# Patient Record
Sex: Female | Born: 1945 | Hispanic: No | Smoking: Never smoker
Health system: Southern US, Community
[De-identification: ages and names within clinical notes are randomized; demographics above are authoritative.]

## PROBLEM LIST (undated history)

## (undated) DIAGNOSIS — I10 Essential (primary) hypertension: Secondary | ICD-10-CM

## (undated) DIAGNOSIS — E119 Type 2 diabetes mellitus without complications: Secondary | ICD-10-CM

## (undated) DIAGNOSIS — Z87442 Personal history of urinary calculi: Secondary | ICD-10-CM

## (undated) DIAGNOSIS — C801 Malignant (primary) neoplasm, unspecified: Secondary | ICD-10-CM

## (undated) DIAGNOSIS — L409 Psoriasis, unspecified: Secondary | ICD-10-CM

## (undated) HISTORY — PX: HYSTEROSCOPY: SHX211

## (undated) HISTORY — PX: BACK SURGERY: SHX140

## (undated) HISTORY — PX: ABDOMINAL HYSTERECTOMY: SHX81

## (undated) HISTORY — PX: CERVICAL SPINE SURGERY: SHX589

---

## 1999-08-22 ENCOUNTER — Ambulatory Visit (HOSPITAL_COMMUNITY): Admission: RE | Admit: 1999-08-22 | Discharge: 1999-08-22 | Payer: Self-pay | Admitting: Gynecology

## 2000-07-26 ENCOUNTER — Other Ambulatory Visit: Admission: RE | Admit: 2000-07-26 | Discharge: 2000-07-26 | Payer: Self-pay | Admitting: Gynecology

## 2001-07-26 ENCOUNTER — Other Ambulatory Visit: Admission: RE | Admit: 2001-07-26 | Discharge: 2001-07-26 | Payer: Self-pay | Admitting: Gynecology

## 2002-08-01 ENCOUNTER — Other Ambulatory Visit: Admission: RE | Admit: 2002-08-01 | Discharge: 2002-08-01 | Payer: Self-pay | Admitting: Gynecology

## 2003-05-10 ENCOUNTER — Encounter: Payer: Self-pay | Admitting: Neurosurgery

## 2003-05-10 ENCOUNTER — Inpatient Hospital Stay (HOSPITAL_COMMUNITY): Admission: RE | Admit: 2003-05-10 | Discharge: 2003-05-11 | Payer: Self-pay | Admitting: Neurosurgery

## 2003-08-07 ENCOUNTER — Other Ambulatory Visit: Admission: RE | Admit: 2003-08-07 | Discharge: 2003-08-07 | Payer: Self-pay | Admitting: Gynecology

## 2004-08-11 ENCOUNTER — Other Ambulatory Visit: Admission: RE | Admit: 2004-08-11 | Discharge: 2004-08-11 | Payer: Self-pay | Admitting: Gynecology

## 2005-02-22 ENCOUNTER — Emergency Department (HOSPITAL_COMMUNITY): Admission: EM | Admit: 2005-02-22 | Discharge: 2005-02-22 | Payer: Self-pay | Admitting: Emergency Medicine

## 2005-08-11 ENCOUNTER — Other Ambulatory Visit: Admission: RE | Admit: 2005-08-11 | Discharge: 2005-08-11 | Payer: Self-pay | Admitting: Gynecology

## 2006-08-26 ENCOUNTER — Other Ambulatory Visit: Admission: RE | Admit: 2006-08-26 | Discharge: 2006-08-26 | Payer: Self-pay | Admitting: Gynecology

## 2007-09-07 ENCOUNTER — Other Ambulatory Visit: Admission: RE | Admit: 2007-09-07 | Discharge: 2007-09-07 | Payer: Self-pay | Admitting: Gynecology

## 2008-10-29 ENCOUNTER — Encounter: Admission: RE | Admit: 2008-10-29 | Discharge: 2008-10-29 | Payer: Self-pay | Admitting: Family Medicine

## 2015-08-15 ENCOUNTER — Other Ambulatory Visit: Payer: Self-pay | Admitting: Family Medicine

## 2015-08-15 DIAGNOSIS — R0989 Other specified symptoms and signs involving the circulatory and respiratory systems: Secondary | ICD-10-CM

## 2015-08-29 ENCOUNTER — Ambulatory Visit
Admission: RE | Admit: 2015-08-29 | Discharge: 2015-08-29 | Disposition: A | Discharge status: Home / Self Care | Payer: Medicare Other | Source: Ambulatory Visit | Attending: Family Medicine | Admitting: Family Medicine

## 2015-08-29 DIAGNOSIS — R0989 Other specified symptoms and signs involving the circulatory and respiratory systems: Secondary | ICD-10-CM

## 2016-07-27 ENCOUNTER — Encounter (HOSPITAL_BASED_OUTPATIENT_CLINIC_OR_DEPARTMENT_OTHER): Payer: Self-pay | Admitting: *Deleted

## 2016-07-27 ENCOUNTER — Emergency Department (HOSPITAL_BASED_OUTPATIENT_CLINIC_OR_DEPARTMENT_OTHER)
Admission: EM | Admit: 2016-07-27 | Discharge: 2016-07-27 | Disposition: A | Discharge status: Home / Self Care | Payer: Medicare Other | Attending: Emergency Medicine | Admitting: Emergency Medicine

## 2016-07-27 DIAGNOSIS — R350 Frequency of micturition: Secondary | ICD-10-CM | POA: Diagnosis present

## 2016-07-27 DIAGNOSIS — I10 Essential (primary) hypertension: Secondary | ICD-10-CM | POA: Insufficient documentation

## 2016-07-27 DIAGNOSIS — Z79899 Other long term (current) drug therapy: Secondary | ICD-10-CM | POA: Insufficient documentation

## 2016-07-27 DIAGNOSIS — N3001 Acute cystitis with hematuria: Secondary | ICD-10-CM | POA: Diagnosis not present

## 2016-07-27 HISTORY — DX: Psoriasis, unspecified: L40.9

## 2016-07-27 HISTORY — DX: Essential (primary) hypertension: I10

## 2016-07-27 LAB — URINALYSIS, ROUTINE W REFLEX MICROSCOPIC
Glucose, UA: NEGATIVE mg/dL
Ketones, ur: 15 mg/dL — AB
NITRITE: POSITIVE — AB
Protein, ur: 30 mg/dL — AB
SPECIFIC GRAVITY, URINE: 1.021 (ref 1.005–1.030)
pH: 5 (ref 5.0–8.0)

## 2016-07-27 LAB — URINE MICROSCOPIC-ADD ON

## 2016-07-27 MED ORDER — LEVOFLOXACIN 500 MG PO TABS: 500.0000 mg | ORAL_TABLET | Freq: Every day | ORAL | 0 refills | Status: DC

## 2016-07-27 MED ORDER — LEVOFLOXACIN 500 MG PO TABS
500.0000 mg | ORAL_TABLET | Freq: Once | ORAL | Status: AC
  Administered 2016-07-27: 500 mg via ORAL
  Filled 2016-07-27: qty 1

## 2016-07-27 NOTE — ED Provider Notes (Signed)
[facility] DEPT [facility] Provider Note   CSN: [id number] Arrival date & time: 07/27/16  1824     History   Chief Complaint Chief Complaint  Patient presents with  . Urinary Frequency    HPI Shelby Fleming is a 70 y.o. female.  HPI Patient has had some urgency with urination for approximately a week. She is also been experiencing general malaise and fatigue. Intermittent left lower quadrant and suprapubic discomfort. No documented fever. No flank pain. Patient reports that she has been treated twice for sinusitis. She first took a course of amoxicillin which she finished approximately 2 weeks ago. She reports she still had residual sinus congestion and pressure and was treated with a course of Zithromax. Symptoms of cough and sinus congestion have now improved. She reports they're not 100% resolved but much better than they were previously. The urinary urgency was a new symptom developing approximately a week ago. She tried some over-the-counter Azo but has not gotten improvement. Past Medical History:  Diagnosis Date  . Hypertension   . Psoriasis     There are no active problems to display for this patient.   Past Surgical History:  Procedure Laterality Date  . ABDOMINAL HYSTERECTOMY    . BACK SURGERY      OB History    No data available       Home Medications    Prior to Admission medications   Medication Sig Start Date End Date Taking? Authorizing Provider  Apremilast (OTEZLA PO) Take by mouth.   Yes Historical Provider, MD  lisinopril (PRINIVIL,ZESTRIL) 40 MG tablet Take 40 mg by mouth daily.   Yes Historical Provider, MD  omega-3 acid ethyl esters (LOVAZA) 1 g capsule Take by mouth 2 (two) times daily.   Yes Historical Provider, MD    Family History History reviewed. No pertinent family history.  Social History Social History  Substance Use Topics  . Smoking status: Never Smoker  . Smokeless tobacco: Never Used  . Alcohol use No     Allergies    Codeine   Review of Systems Review of Systems 10 Systems reviewed and are negative for acute change except as noted in the HPI.   Physical Exam Updated Vital Signs BP 181/78   Pulse 78   Temp 98.1 F (36.7 C)   Resp 18   Ht 5\' 4"  (1.626 m)   Wt 189 lb (85.7 kg)   SpO2 98%   BMI 32.44 kg/m   Physical Exam  Constitutional: She appears well-developed and well-nourished. No distress.  HENT:  Head: Normocephalic and atraumatic.  Mouth/Throat: Oropharynx is clear and moist.  Bilateral TMs normal. Nares patent without drainage or bogginess.  Eyes: Conjunctivae and EOM are normal.  Neck: Neck supple.  Cardiovascular: Normal rate and regular rhythm.   No murmur heard. Pulmonary/Chest: Effort normal and breath sounds normal. No respiratory distress.  Abdominal: Soft. There is no tenderness.  Very mildly reproducible suprapubic discomfort to deep palpation. No CVA tenderness to percussion.  Musculoskeletal: Normal range of motion. She exhibits no edema or tenderness.  Excellent condition of lower extremities.  Neurological: She is alert. She exhibits normal muscle tone. Coordination normal.  Skin: Skin is warm and dry.  Psychiatric: She has a normal mood and affect.  Nursing note and vitals reviewed.    ED Treatments / Results  Labs (all labs ordered are listed, but only abnormal results are displayed) Labs Reviewed  URINALYSIS, ROUTINE W REFLEX MICROSCOPIC (NOT AT [facility]) - Abnormal; Notable for the  following:       Result Value   Color, Urine ORANGE (*)    APPearance CLOUDY (*)    Hgb urine dipstick MODERATE (*)    Bilirubin Urine SMALL (*)    Ketones, ur 15 (*)    Protein, ur 30 (*)    Nitrite POSITIVE (*)    Leukocytes, UA MODERATE (*)    All other components within normal limits  URINE MICROSCOPIC-ADD ON - Abnormal; Notable for the following:    Squamous Epithelial / LPF 0-5 (*)    Bacteria, UA MANY (*)    All other components within normal limits  URINE  CULTURE    EKG  EKG Interpretation None       Radiology No results found.  Procedures Procedures (including critical care time)  Medications Ordered in ED Medications  levofloxacin (LEVAQUIN) tablet 500 mg (not administered)     Initial Impression / Assessment and Plan / ED Course  I have reviewed the triage vital signs and the nursing notes.  Pertinent labs & imaging results that were available during my care of the patient were reviewed by me and considered in my medical decision making (see chart for details).  Clinical Course     Final Clinical Impressions(s) / ED Diagnoses   Final diagnoses:  Acute cystitis with hematuria  Patient is alert and nontoxic. She has not had fever. No CVA tenderness. Patient has. General malaise and fatigue in conjunction with urinary urgency. Clinically the patient is alert and appropriate and nontoxic. I will initiate outpatient antibiotics. Patient has follow-up with her PCP tomorrow. She is counseled on signs and symptoms were to return.  New Prescriptions New Prescriptions   No medications on file     [name], MD 07/27/16 2022

## 2016-07-27 NOTE — ED Triage Notes (Signed)
Pt c/o urinary urgency and pressure x 2 days

## 2016-07-27 NOTE — ED Notes (Signed)
ED Provider at bedside. 

## 2016-07-30 LAB — URINE CULTURE

## 2016-07-31 ENCOUNTER — Telehealth (HOSPITAL_BASED_OUTPATIENT_CLINIC_OR_DEPARTMENT_OTHER): Payer: Self-pay

## 2016-07-31 NOTE — Telephone Encounter (Signed)
Post ED Visit - Positive Culture Follow-up  Culture report reviewed by antimicrobial stewardship pharmacist:  []  Elenor Quinones, Pharm.D. []  Heide Guile, Pharm.D., BCPS []  Parks Neptune, Pharm.D. []  Alycia Rossetti, Pharm.D., BCPS []  [place], Pharm.D., BCPS, AAHIVP []  Legrand Como, Pharm.D., BCPS, AAHIVP []  Milus Glazier, Pharm.D. []  Stephens November, [place].D. Dimitri Ped Pharm D Positive urine culture Treated with levofloxacin, organism sensitive to the same and no further patient follow-up is required at this time.  Genia Del 07/31/2016, 12:06 PM

## 2019-10-15 ENCOUNTER — Ambulatory Visit: Payer: Medicare Other | Attending: Internal Medicine

## 2019-10-15 DIAGNOSIS — Z23 Encounter for immunization: Secondary | ICD-10-CM | POA: Insufficient documentation

## 2019-10-15 NOTE — Progress Notes (Signed)
   Covid-19 Vaccination Clinic  Name:  Shelby Fleming    MRN: [id number] DOB: 1946/07/27  10/15/2019  [name] was observed post Covid-19 immunization for 15 minutes without incidence. She was provided with Vaccine Information Sheet and instruction to access the V-Safe system.   [name] was instructed to call 911 with any severe reactions post vaccine: Marland Kitchen Difficulty breathing  . Swelling of your face and throat  . A fast heartbeat  . A bad rash all over your body  . Dizziness and weakness    Immunizations Administered    Name Date Dose VIS Date Route   Pfizer COVID-19 Vaccine 10/15/2019  4:48 PM 0.3 mL 08/18/2019 Intramuscular   Manufacturer: [place]   Lot: [id number]   [place]: [id number]

## 2019-11-09 ENCOUNTER — Ambulatory Visit: Payer: Medicare Other | Attending: Internal Medicine

## 2019-11-09 DIAGNOSIS — Z23 Encounter for immunization: Secondary | ICD-10-CM | POA: Insufficient documentation

## 2019-11-09 NOTE — Progress Notes (Signed)
   Covid-19 Vaccination Clinic  Name:  Shelby Fleming    MRN: [id number] DOB: Aug 03, 1946  11/09/2019  [name] was observed post Covid-19 immunization for 15 minutes without incident. She was provided with Vaccine Information Sheet and instruction to access the V-Safe system.   [name] was instructed to call 911 with any severe reactions post vaccine: Marland Kitchen Difficulty breathing  . Swelling of face and throat  . A fast heartbeat  . A bad rash all over body  . Dizziness and weakness   Immunizations Administered    Name Date Dose VIS Date Route   Pfizer COVID-19 Vaccine 11/09/2019  6:17 PM 0.3 mL 08/18/2019 Intramuscular   Manufacturer: [place]   Lot: [id number]   [place]: [id number]

## 2020-07-06 ENCOUNTER — Ambulatory Visit: Payer: Self-pay | Attending: Internal Medicine

## 2020-07-06 DIAGNOSIS — Z23 Encounter for immunization: Secondary | ICD-10-CM

## 2020-07-06 NOTE — Progress Notes (Signed)
   Covid-19 Vaccination Clinic  Name:  Shelby Fleming    MRN: [id number] DOB: Jul 04, 1946  07/06/2020  Shelby Fleming was observed post Covid-19 immunization for 15 minutes without incident. She was provided with Vaccine Information Sheet and instruction to access the V-Safe system.   Shelby Fleming was instructed to call 911 with any severe reactions post vaccine: Marland Kitchen Difficulty breathing  . Swelling of face and throat  . A fast heartbeat  . A bad rash all over body  . Dizziness and weakness

## 2020-09-11 DIAGNOSIS — M2550 Pain in unspecified joint: Secondary | ICD-10-CM | POA: Diagnosis not present

## 2020-09-11 DIAGNOSIS — L409 Psoriasis, unspecified: Secondary | ICD-10-CM | POA: Diagnosis not present

## 2020-09-11 DIAGNOSIS — M542 Cervicalgia: Secondary | ICD-10-CM | POA: Diagnosis not present

## 2020-09-11 DIAGNOSIS — M150 Primary generalized (osteo)arthritis: Secondary | ICD-10-CM | POA: Diagnosis not present

## 2020-09-11 DIAGNOSIS — L4059 Other psoriatic arthropathy: Secondary | ICD-10-CM | POA: Diagnosis not present

## 2020-10-03 DIAGNOSIS — H2000 Unspecified acute and subacute iridocyclitis: Secondary | ICD-10-CM | POA: Diagnosis not present

## 2020-10-03 DIAGNOSIS — N390 Urinary tract infection, site not specified: Secondary | ICD-10-CM | POA: Diagnosis not present

## 2020-10-18 DIAGNOSIS — I10 Essential (primary) hypertension: Secondary | ICD-10-CM | POA: Diagnosis not present

## 2020-10-18 DIAGNOSIS — E1169 Type 2 diabetes mellitus with other specified complication: Secondary | ICD-10-CM | POA: Diagnosis not present

## 2020-10-18 DIAGNOSIS — E785 Hyperlipidemia, unspecified: Secondary | ICD-10-CM | POA: Diagnosis not present

## 2020-10-18 DIAGNOSIS — L4050 Arthropathic psoriasis, unspecified: Secondary | ICD-10-CM | POA: Diagnosis not present

## 2020-10-23 DIAGNOSIS — M5030 Other cervical disc degeneration, unspecified cervical region: Secondary | ICD-10-CM | POA: Diagnosis not present

## 2020-10-23 DIAGNOSIS — L4059 Other psoriatic arthropathy: Secondary | ICD-10-CM | POA: Diagnosis not present

## 2020-10-23 DIAGNOSIS — M150 Primary generalized (osteo)arthritis: Secondary | ICD-10-CM | POA: Diagnosis not present

## 2020-10-23 DIAGNOSIS — L409 Psoriasis, unspecified: Secondary | ICD-10-CM | POA: Diagnosis not present

## 2020-10-23 DIAGNOSIS — M2550 Pain in unspecified joint: Secondary | ICD-10-CM | POA: Diagnosis not present

## 2021-04-22 DIAGNOSIS — M5030 Other cervical disc degeneration, unspecified cervical region: Secondary | ICD-10-CM | POA: Diagnosis not present

## 2021-04-22 DIAGNOSIS — L4059 Other psoriatic arthropathy: Secondary | ICD-10-CM | POA: Diagnosis not present

## 2021-04-22 DIAGNOSIS — L409 Psoriasis, unspecified: Secondary | ICD-10-CM | POA: Diagnosis not present

## 2021-04-22 DIAGNOSIS — M2550 Pain in unspecified joint: Secondary | ICD-10-CM | POA: Diagnosis not present

## 2021-04-22 DIAGNOSIS — M150 Primary generalized (osteo)arthritis: Secondary | ICD-10-CM | POA: Diagnosis not present

## 2021-04-25 DIAGNOSIS — E785 Hyperlipidemia, unspecified: Secondary | ICD-10-CM | POA: Diagnosis not present

## 2021-04-25 DIAGNOSIS — H524 Presbyopia: Secondary | ICD-10-CM | POA: Diagnosis not present

## 2021-04-25 DIAGNOSIS — Z0000 Encounter for general adult medical examination without abnormal findings: Secondary | ICD-10-CM | POA: Diagnosis not present

## 2021-04-25 DIAGNOSIS — E1169 Type 2 diabetes mellitus with other specified complication: Secondary | ICD-10-CM | POA: Diagnosis not present

## 2021-04-25 DIAGNOSIS — H5211 Myopia, right eye: Secondary | ICD-10-CM | POA: Diagnosis not present

## 2021-04-25 DIAGNOSIS — Z961 Presence of intraocular lens: Secondary | ICD-10-CM | POA: Diagnosis not present

## 2021-04-28 DIAGNOSIS — E785 Hyperlipidemia, unspecified: Secondary | ICD-10-CM | POA: Diagnosis not present

## 2021-04-28 DIAGNOSIS — Z0000 Encounter for general adult medical examination without abnormal findings: Secondary | ICD-10-CM | POA: Diagnosis not present

## 2021-04-28 DIAGNOSIS — I10 Essential (primary) hypertension: Secondary | ICD-10-CM | POA: Diagnosis not present

## 2021-04-28 DIAGNOSIS — L4050 Arthropathic psoriasis, unspecified: Secondary | ICD-10-CM | POA: Diagnosis not present

## 2021-04-28 DIAGNOSIS — R079 Chest pain, unspecified: Secondary | ICD-10-CM | POA: Diagnosis not present

## 2021-04-28 DIAGNOSIS — E1169 Type 2 diabetes mellitus with other specified complication: Secondary | ICD-10-CM | POA: Diagnosis not present

## 2021-06-17 DIAGNOSIS — Z1231 Encounter for screening mammogram for malignant neoplasm of breast: Secondary | ICD-10-CM | POA: Diagnosis not present

## 2021-09-25 DIAGNOSIS — D0471 Carcinoma in situ of skin of right lower limb, including hip: Secondary | ICD-10-CM | POA: Diagnosis not present

## 2021-09-25 DIAGNOSIS — L814 Other melanin hyperpigmentation: Secondary | ICD-10-CM | POA: Diagnosis not present

## 2021-09-25 DIAGNOSIS — L281 Prurigo nodularis: Secondary | ICD-10-CM | POA: Diagnosis not present

## 2021-09-25 DIAGNOSIS — C44729 Squamous cell carcinoma of skin of left lower limb, including hip: Secondary | ICD-10-CM | POA: Diagnosis not present

## 2021-09-25 DIAGNOSIS — L821 Other seborrheic keratosis: Secondary | ICD-10-CM | POA: Diagnosis not present

## 2021-09-25 DIAGNOSIS — D2261 Melanocytic nevi of right upper limb, including shoulder: Secondary | ICD-10-CM | POA: Diagnosis not present

## 2021-09-25 DIAGNOSIS — D225 Melanocytic nevi of trunk: Secondary | ICD-10-CM | POA: Diagnosis not present

## 2021-09-25 DIAGNOSIS — Z85828 Personal history of other malignant neoplasm of skin: Secondary | ICD-10-CM | POA: Diagnosis not present

## 2021-09-25 DIAGNOSIS — L400 Psoriasis vulgaris: Secondary | ICD-10-CM | POA: Diagnosis not present

## 2021-09-25 DIAGNOSIS — D485 Neoplasm of uncertain behavior of skin: Secondary | ICD-10-CM | POA: Diagnosis not present

## 2021-10-09 DIAGNOSIS — D0471 Carcinoma in situ of skin of right lower limb, including hip: Secondary | ICD-10-CM | POA: Diagnosis not present

## 2021-10-09 DIAGNOSIS — L281 Prurigo nodularis: Secondary | ICD-10-CM | POA: Diagnosis not present

## 2021-10-29 DIAGNOSIS — L281 Prurigo nodularis: Secondary | ICD-10-CM | POA: Diagnosis not present

## 2021-10-29 DIAGNOSIS — Z85828 Personal history of other malignant neoplasm of skin: Secondary | ICD-10-CM | POA: Diagnosis not present

## 2021-10-29 DIAGNOSIS — L400 Psoriasis vulgaris: Secondary | ICD-10-CM | POA: Diagnosis not present

## 2021-10-29 DIAGNOSIS — M150 Primary generalized (osteo)arthritis: Secondary | ICD-10-CM | POA: Diagnosis not present

## 2021-10-29 DIAGNOSIS — L409 Psoriasis, unspecified: Secondary | ICD-10-CM | POA: Diagnosis not present

## 2021-10-29 DIAGNOSIS — L4059 Other psoriatic arthropathy: Secondary | ICD-10-CM | POA: Diagnosis not present

## 2021-10-29 DIAGNOSIS — L570 Actinic keratosis: Secondary | ICD-10-CM | POA: Diagnosis not present

## 2021-10-29 DIAGNOSIS — M5030 Other cervical disc degeneration, unspecified cervical region: Secondary | ICD-10-CM | POA: Diagnosis not present

## 2021-10-30 DIAGNOSIS — E1169 Type 2 diabetes mellitus with other specified complication: Secondary | ICD-10-CM | POA: Diagnosis not present

## 2021-10-30 DIAGNOSIS — E785 Hyperlipidemia, unspecified: Secondary | ICD-10-CM | POA: Diagnosis not present

## 2021-10-30 DIAGNOSIS — I10 Essential (primary) hypertension: Secondary | ICD-10-CM | POA: Diagnosis not present

## 2021-10-30 DIAGNOSIS — L4050 Arthropathic psoriasis, unspecified: Secondary | ICD-10-CM | POA: Diagnosis not present

## 2021-12-10 DIAGNOSIS — D0471 Carcinoma in situ of skin of right lower limb, including hip: Secondary | ICD-10-CM | POA: Diagnosis not present

## 2021-12-10 DIAGNOSIS — D0472 Carcinoma in situ of skin of left lower limb, including hip: Secondary | ICD-10-CM | POA: Diagnosis not present

## 2021-12-10 DIAGNOSIS — C44729 Squamous cell carcinoma of skin of left lower limb, including hip: Secondary | ICD-10-CM | POA: Diagnosis not present

## 2022-01-31 ENCOUNTER — Emergency Department (HOSPITAL_BASED_OUTPATIENT_CLINIC_OR_DEPARTMENT_OTHER): Payer: Medicare Other

## 2022-01-31 ENCOUNTER — Inpatient Hospital Stay (HOSPITAL_BASED_OUTPATIENT_CLINIC_OR_DEPARTMENT_OTHER)
Admission: EM | Admit: 2022-01-31 | Discharge: 2022-02-11 | DRG: 030 | Disposition: A | Discharge status: Rehabilitation Facility | Payer: Medicare Other | Attending: Neurosurgery | Admitting: Neurosurgery

## 2022-01-31 ENCOUNTER — Encounter (HOSPITAL_BASED_OUTPATIENT_CLINIC_OR_DEPARTMENT_OTHER): Payer: Self-pay

## 2022-01-31 ENCOUNTER — Emergency Department (HOSPITAL_BASED_OUTPATIENT_CLINIC_OR_DEPARTMENT_OTHER): Payer: Medicare Other | Admitting: Radiology

## 2022-01-31 ENCOUNTER — Other Ambulatory Visit: Payer: Self-pay

## 2022-01-31 DIAGNOSIS — M79601 Pain in right arm: Secondary | ICD-10-CM | POA: Diagnosis not present

## 2022-01-31 DIAGNOSIS — W01198A Fall on same level from slipping, tripping and stumbling with subsequent striking against other object, initial encounter: Secondary | ICD-10-CM | POA: Diagnosis present

## 2022-01-31 DIAGNOSIS — E785 Hyperlipidemia, unspecified: Secondary | ICD-10-CM | POA: Diagnosis not present

## 2022-01-31 DIAGNOSIS — Z885 Allergy status to narcotic agent status: Secondary | ICD-10-CM | POA: Diagnosis not present

## 2022-01-31 DIAGNOSIS — S12200A Unspecified displaced fracture of third cervical vertebra, initial encounter for closed fracture: Secondary | ICD-10-CM | POA: Diagnosis not present

## 2022-01-31 DIAGNOSIS — M1990 Unspecified osteoarthritis, unspecified site: Secondary | ICD-10-CM | POA: Diagnosis not present

## 2022-01-31 DIAGNOSIS — R339 Retention of urine, unspecified: Secondary | ICD-10-CM | POA: Diagnosis not present

## 2022-01-31 DIAGNOSIS — Y92009 Unspecified place in unspecified non-institutional (private) residence as the place of occurrence of the external cause: Secondary | ICD-10-CM | POA: Diagnosis not present

## 2022-01-31 DIAGNOSIS — E876 Hypokalemia: Secondary | ICD-10-CM | POA: Diagnosis not present

## 2022-01-31 DIAGNOSIS — M25562 Pain in left knee: Secondary | ICD-10-CM | POA: Diagnosis not present

## 2022-01-31 DIAGNOSIS — M4802 Spinal stenosis, cervical region: Secondary | ICD-10-CM | POA: Diagnosis not present

## 2022-01-31 DIAGNOSIS — D649 Anemia, unspecified: Secondary | ICD-10-CM | POA: Diagnosis not present

## 2022-01-31 DIAGNOSIS — S12300A Unspecified displaced fracture of fourth cervical vertebra, initial encounter for closed fracture: Secondary | ICD-10-CM | POA: Diagnosis present

## 2022-01-31 DIAGNOSIS — N319 Neuromuscular dysfunction of bladder, unspecified: Secondary | ICD-10-CM | POA: Diagnosis not present

## 2022-01-31 DIAGNOSIS — S12301A Unspecified nondisplaced fracture of fourth cervical vertebra, initial encounter for closed fracture: Secondary | ICD-10-CM | POA: Diagnosis not present

## 2022-01-31 DIAGNOSIS — L409 Psoriasis, unspecified: Secondary | ICD-10-CM | POA: Diagnosis present

## 2022-01-31 DIAGNOSIS — I10 Essential (primary) hypertension: Secondary | ICD-10-CM | POA: Diagnosis not present

## 2022-01-31 DIAGNOSIS — W19XXXA Unspecified fall, initial encounter: Secondary | ICD-10-CM | POA: Diagnosis not present

## 2022-01-31 DIAGNOSIS — S14129A Central cord syndrome at unspecified level of cervical spinal cord, initial encounter: Secondary | ICD-10-CM | POA: Diagnosis not present

## 2022-01-31 DIAGNOSIS — M50221 Other cervical disc displacement at C4-C5 level: Secondary | ICD-10-CM | POA: Diagnosis present

## 2022-01-31 DIAGNOSIS — Z043 Encounter for examination and observation following other accident: Secondary | ICD-10-CM | POA: Diagnosis not present

## 2022-01-31 DIAGNOSIS — G8911 Acute pain due to trauma: Secondary | ICD-10-CM | POA: Diagnosis not present

## 2022-01-31 DIAGNOSIS — M79642 Pain in left hand: Secondary | ICD-10-CM | POA: Diagnosis not present

## 2022-01-31 DIAGNOSIS — Z79899 Other long term (current) drug therapy: Secondary | ICD-10-CM | POA: Diagnosis not present

## 2022-01-31 DIAGNOSIS — S12290A Other displaced fracture of third cervical vertebra, initial encounter for closed fracture: Secondary | ICD-10-CM | POA: Diagnosis not present

## 2022-01-31 DIAGNOSIS — Z981 Arthrodesis status: Secondary | ICD-10-CM

## 2022-01-31 DIAGNOSIS — S0990XA Unspecified injury of head, initial encounter: Secondary | ICD-10-CM | POA: Diagnosis not present

## 2022-01-31 DIAGNOSIS — S12391A Other nondisplaced fracture of fourth cervical vertebra, initial encounter for closed fracture: Secondary | ICD-10-CM | POA: Diagnosis not present

## 2022-01-31 DIAGNOSIS — R1310 Dysphagia, unspecified: Secondary | ICD-10-CM | POA: Diagnosis not present

## 2022-01-31 DIAGNOSIS — S12100A Unspecified displaced fracture of second cervical vertebra, initial encounter for closed fracture: Secondary | ICD-10-CM | POA: Diagnosis not present

## 2022-01-31 DIAGNOSIS — R600 Localized edema: Secondary | ICD-10-CM | POA: Diagnosis not present

## 2022-01-31 DIAGNOSIS — S80212A Abrasion, left knee, initial encounter: Secondary | ICD-10-CM | POA: Diagnosis present

## 2022-01-31 DIAGNOSIS — K5900 Constipation, unspecified: Secondary | ICD-10-CM | POA: Diagnosis not present

## 2022-01-31 DIAGNOSIS — S14124A Central cord syndrome at C4 level of cervical spinal cord, initial encounter: Principal | ICD-10-CM | POA: Diagnosis present

## 2022-01-31 DIAGNOSIS — S14129D Central cord syndrome at unspecified level of cervical spinal cord, subsequent encounter: Secondary | ICD-10-CM | POA: Diagnosis not present

## 2022-01-31 DIAGNOSIS — Z743 Need for continuous supervision: Secondary | ICD-10-CM | POA: Diagnosis not present

## 2022-01-31 DIAGNOSIS — Z4889 Encounter for other specified surgical aftercare: Secondary | ICD-10-CM | POA: Diagnosis not present

## 2022-01-31 DIAGNOSIS — M7989 Other specified soft tissue disorders: Secondary | ICD-10-CM | POA: Diagnosis not present

## 2022-01-31 DIAGNOSIS — K592 Neurogenic bowel, not elsewhere classified: Secondary | ICD-10-CM | POA: Diagnosis not present

## 2022-01-31 DIAGNOSIS — R208 Other disturbances of skin sensation: Secondary | ICD-10-CM | POA: Diagnosis present

## 2022-01-31 DIAGNOSIS — S129XXA Fracture of neck, unspecified, initial encounter: Secondary | ICD-10-CM | POA: Diagnosis present

## 2022-01-31 DIAGNOSIS — S14104A Unspecified injury at C4 level of cervical spinal cord, initial encounter: Secondary | ICD-10-CM | POA: Diagnosis not present

## 2022-01-31 DIAGNOSIS — M25552 Pain in left hip: Secondary | ICD-10-CM | POA: Diagnosis not present

## 2022-01-31 DIAGNOSIS — M19049 Primary osteoarthritis, unspecified hand: Secondary | ICD-10-CM | POA: Diagnosis not present

## 2022-01-31 DIAGNOSIS — Z8669 Personal history of other diseases of the nervous system and sense organs: Secondary | ICD-10-CM | POA: Diagnosis not present

## 2022-01-31 LAB — CBC WITH DIFFERENTIAL/PLATELET
Abs Immature Granulocytes: 0.05 10*3/uL (ref 0.00–0.07)
Basophils Absolute: 0 10*3/uL (ref 0.0–0.1)
Basophils Relative: 0 %
Eosinophils Absolute: 0.2 10*3/uL (ref 0.0–0.5)
Eosinophils Relative: 2 %
HCT: 33.4 % — ABNORMAL LOW (ref 36.0–46.0)
Hemoglobin: 10.5 g/dL — ABNORMAL LOW (ref 12.0–15.0)
Immature Granulocytes: 1 %
Lymphocytes Relative: 13 %
Lymphs Abs: 1.3 10*3/uL (ref 0.7–4.0)
MCH: 27.6 pg (ref 26.0–34.0)
MCHC: 31.4 g/dL (ref 30.0–36.0)
MCV: 87.9 fL (ref 80.0–100.0)
Monocytes Absolute: 0.6 10*3/uL (ref 0.1–1.0)
Monocytes Relative: 6 %
Neutro Abs: 7.8 10*3/uL — ABNORMAL HIGH (ref 1.7–7.7)
Neutrophils Relative %: 78 %
Platelets: 307 10*3/uL (ref 150–400)
RBC: 3.8 MIL/uL — ABNORMAL LOW (ref 3.87–5.11)
RDW: 13.7 % (ref 11.5–15.5)
WBC: 9.9 10*3/uL (ref 4.0–10.5)
nRBC: 0 % (ref 0.0–0.2)

## 2022-01-31 LAB — COMPREHENSIVE METABOLIC PANEL
ALT: 7 U/L (ref 0–44)
AST: 13 U/L — ABNORMAL LOW (ref 15–41)
Albumin: 4.1 g/dL (ref 3.5–5.0)
Alkaline Phosphatase: 50 U/L (ref 38–126)
Anion gap: 10 (ref 5–15)
BUN: 22 mg/dL (ref 8–23)
CO2: 25 mmol/L (ref 22–32)
Calcium: 9.3 mg/dL (ref 8.9–10.3)
Chloride: 103 mmol/L (ref 98–111)
Creatinine, Ser: 0.97 mg/dL (ref 0.44–1.00)
GFR, Estimated: >60 mL/min (ref >60)
Glucose, Bld: 172 mg/dL — ABNORMAL HIGH (ref 70–99)
Potassium: 3.8 mmol/L (ref 3.5–5.1)
Sodium: 138 mmol/L (ref 135–145)
Total Bilirubin: 0.5 mg/dL (ref 0.3–1.2)
Total Protein: 6.9 g/dL (ref 6.5–8.1)

## 2022-01-31 LAB — PROTIME-INR
INR: 1 (ref 0.8–1.2)
Prothrombin Time: 12.9 seconds (ref 11.4–15.2)

## 2022-01-31 LAB — APTT: aPTT: 26 seconds (ref 24–36)

## 2022-01-31 MED ORDER — POLYETHYLENE GLYCOL 3350 17 G PO PACK: 17.0000 g | PACK | Freq: Every day | ORAL | Status: DC | PRN

## 2022-01-31 MED ORDER — SODIUM CHLORIDE 0.9% FLUSH
3.0000 mL | Freq: Two times a day (BID) | INTRAVENOUS | Status: DC
  Administered 2022-01-31 – 2022-02-01 (×3): 3 mL via INTRAVENOUS

## 2022-01-31 MED ORDER — ONDANSETRON HCL 4 MG PO TABS: 4.0000 mg | ORAL_TABLET | Freq: Four times a day (QID) | ORAL | Status: DC | PRN

## 2022-01-31 MED ORDER — HYDROMORPHONE HCL 1 MG/ML IJ SOLN
1.0000 mg | INTRAMUSCULAR | Status: DC | PRN
  Administered 2022-01-31 – 2022-02-01 (×7): 1 mg via INTRAVENOUS
  Filled 2022-01-31 (×8): qty 1

## 2022-01-31 MED ORDER — SODIUM CHLORIDE 0.9 % IV SOLN
250.0000 mL | INTRAVENOUS | Status: DC
  Administered 2022-01-31: 250 mL via INTRAVENOUS

## 2022-01-31 MED ORDER — ONDANSETRON HCL 4 MG/2ML IJ SOLN
4.0000 mg | Freq: Four times a day (QID) | INTRAMUSCULAR | Status: DC | PRN
  Administered 2022-02-01: 4 mg via INTRAVENOUS
  Filled 2022-01-31: qty 2

## 2022-01-31 MED ORDER — OXYCODONE HCL 5 MG PO TABS: 5.0000 mg | ORAL_TABLET | ORAL | Status: DC | PRN

## 2022-01-31 MED ORDER — OXYCODONE HCL 5 MG PO TABS: 10.0000 mg | ORAL_TABLET | ORAL | Status: DC | PRN

## 2022-01-31 MED ORDER — FENTANYL CITRATE PF 50 MCG/ML IJ SOSY
50.0000 ug | PREFILLED_SYRINGE | Freq: Once | INTRAMUSCULAR | Status: AC
  Administered 2022-01-31: 50 ug via INTRAVENOUS
  Filled 2022-01-31: qty 1

## 2022-01-31 MED ORDER — LISINOPRIL 20 MG PO TABS
40.0000 mg | ORAL_TABLET | Freq: Every day | ORAL | Status: DC
  Filled 2022-01-31: qty 2

## 2022-01-31 MED ORDER — SODIUM CHLORIDE 0.9% FLUSH: 3.0000 mL | INTRAVENOUS | Status: DC | PRN

## 2022-01-31 MED ORDER — ACETAMINOPHEN 650 MG RE SUPP: 650.0000 mg | RECTAL | Status: DC | PRN

## 2022-01-31 MED ORDER — ACETAMINOPHEN 325 MG PO TABS
650.0000 mg | ORAL_TABLET | ORAL | Status: DC | PRN
  Administered 2022-02-01: 650 mg via ORAL
  Filled 2022-01-31: qty 2

## 2022-01-31 MED ORDER — MENTHOL 3 MG MT LOZG: 1.0000 | LOZENGE | OROMUCOSAL | Status: DC | PRN

## 2022-01-31 MED ORDER — PHENOL 1.4 % MT LIQD: 1.0000 | OROMUCOSAL | Status: DC | PRN

## 2022-01-31 MED ORDER — DOCUSATE SODIUM 100 MG PO CAPS
100.0000 mg | ORAL_CAPSULE | Freq: Two times a day (BID) | ORAL | Status: DC
  Administered 2022-01-31 – 2022-02-01 (×2): 100 mg via ORAL
  Filled 2022-01-31 (×2): qty 1

## 2022-01-31 MED ORDER — CYCLOBENZAPRINE HCL 10 MG PO TABS
10.0000 mg | ORAL_TABLET | Freq: Three times a day (TID) | ORAL | Status: DC | PRN
  Administered 2022-01-31 – 2022-02-01 (×3): 10 mg via ORAL
  Filled 2022-01-31 (×4): qty 1

## 2022-01-31 NOTE — Progress Notes (Signed)
New Patient admitted from [place] arrived via stretcher. Pt placed in RM 5N30, Daughter at bedside. Pt was oriented to room. Call bell within reach. Bed at lowest position. Pt was made comfortable.

## 2022-01-31 NOTE — ED Notes (Signed)
Report give to [facility].

## 2022-01-31 NOTE — ED Provider Notes (Signed)
[facility] EMERGENCY DEPT Provider Note   CSN: [id number] Arrival date & time: 01/31/22  1728     History  Chief Complaint  Patient presents with   Lytle Michaels    Shelby Fleming is a 76 y.o. female.  HPI 76 year old female presents with a fall.  She tripped and hit the storm door.  She had a hard enough that she has some dried paint on her face.  She did not lose consciousness.  She states she could not get up and could not help to get up because she has diffuse tingling to all 4 extremities.  When asked where she hurts she cannot tell me except that everywhere is uncomfortable.  It is hard to localize any specific trauma.  She denies any neck pain or back pain.  Feels like it is hard to lift any of her extremities.  She denies blood thinner use.  Home Medications Prior to Admission medications   Medication Sig Start Date End Date Taking? Authorizing Provider  Apremilast (OTEZLA PO) Take by mouth.    [provider]  levofloxacin (LEVAQUIN) 500 MG tablet Take 1 tablet (500 mg total) by mouth daily. 07/27/16   [name], MD  lisinopril (PRINIVIL,ZESTRIL) 40 MG tablet Take 40 mg by mouth daily.    [provider]  omega-3 acid ethyl esters (LOVAZA) 1 g capsule Take by mouth 2 (two) times daily.    [provider]      Allergies    Codeine    Review of Systems   Review of Systems  HENT:  Negative for facial swelling.   Cardiovascular:  Negative for chest pain.  Gastrointestinal:  Negative for abdominal pain.  Musculoskeletal:  Negative for back pain and neck pain.  Neurological:  Positive for numbness and headaches. Negative for syncope.   Physical Exam Updated Vital Signs BP (!) 128/55 (BP Location: Left Arm)   Pulse 86   Temp 98.3 F (36.8 C) (Oral)   Resp 14   Ht '5\' 4"'$  (1.626 m)   Wt 89.1 kg   SpO2 93%   BMI 33.72 kg/m  Physical Exam Vitals and nursing note reviewed.  Constitutional:      Appearance: She is well-developed.   HENT:     Head: Normocephalic.     Comments: Left periorbital face and nose has some thin white substance c/w residue from the door No maxillofacial tenderness or swelling.  No tenderness to the forehead or temple Eyes:     Extraocular Movements: Extraocular movements intact.     Pupils: Pupils are equal, round, and reactive to light.  Cardiovascular:     Rate and Rhythm: Normal rate and regular rhythm.     Pulses:          Radial pulses are 2+ on the right side and 2+ on the left side.       Dorsalis pedis pulses are 2+ on the right side and 2+ on the left side.     Heart sounds: Normal heart sounds.  Pulmonary:     Effort: Pulmonary effort is normal.     Breath sounds: Normal breath sounds.  Abdominal:     Palpations: Abdomen is soft.     Tenderness: There is no abdominal tenderness.  Musculoskeletal:     Left hand: Tenderness (mild) present. No swelling. Normal range of motion.     Right hip: No tenderness. Normal range of motion.     Left hip: No tenderness. Normal range of motion.  Left knee: Normal range of motion. Tenderness (mild) present.  Skin:    General: Skin is warm and dry.  Neurological:     Mental Status: She is alert.     Comments: Patient does not lift her extremities voluntarily.  When I lift them and goes slowly she is able to keep them up against gravity with no problems.  She can move them though she feels a diffuse tingling sensation.  However she grossly has normal sensation in all 4 extremities.    ED Results / Procedures / Treatments   Labs (all labs ordered are listed, but only abnormal results are displayed) Labs Reviewed  CBC WITH DIFFERENTIAL/PLATELET - Abnormal; Notable for the following components:      Result Value   RBC 3.80 (*)    Hemoglobin 10.5 (*)    HCT 33.4 (*)    Neutro Abs 7.8 (*)    All other components within normal limits  COMPREHENSIVE METABOLIC PANEL - Abnormal; Notable for the following components:   Glucose, Bld 172 (*)     AST 13 (*)    All other components within normal limits  PROTIME-INR  APTT    EKG None  Radiology CT Head Wo Contrast  Result Date: 01/31/2022 CLINICAL DATA:  Fall EXAM: CT HEAD WITHOUT CONTRAST TECHNIQUE: Contiguous axial images were obtained from the base of the skull through the vertex without intravenous contrast. RADIATION DOSE REDUCTION: This exam was performed according to the departmental dose-optimization program which includes automated exposure control, adjustment of the mA and/or kV according to patient size and/or use of iterative reconstruction technique. COMPARISON:  None Available. FINDINGS: Brain: No acute intracranial abnormality. Specifically, no hemorrhage, hydrocephalus, mass lesion, acute infarction, or significant intracranial injury. Vascular: No hyperdense vessel or unexpected calcification. Skull: No acute calvarial abnormality. Sinuses/Orbits: No acute findings Other: None IMPRESSION: No acute intracranial abnormality. Electronically Signed   By: Rolm Baptise M.D.   On: 01/31/2022 18:57   CT Cervical Spine Wo Contrast  Result Date: 01/31/2022 CLINICAL DATA:  Fall EXAM: CT CERVICAL SPINE WITHOUT CONTRAST TECHNIQUE: Multidetector CT imaging of the cervical spine was performed without intravenous contrast. Multiplanar CT image reconstructions were also generated. RADIATION DOSE REDUCTION: This exam was performed according to the departmental dose-optimization program which includes automated exposure control, adjustment of the mA and/or kV according to patient size and/or use of iterative reconstruction technique. COMPARISON:  None Available. FINDINGS: Alignment: No subluxation Skull base and vertebrae: Fracture noted through the C4 vertebral body. This extends superiorly to involve the anterior inferior corner/spur at C3. Soft tissues and spinal canal: Mild prevertebral soft tissue swelling anterior to the C3 and C4 vertebral bodies. Central canal stenosis noted at C4-5  due to chronic appearing calcified disc herniation/bulge. Disc levels: Prior anterior fusion from C4-C6. Diffuse advanced degenerative disc disease and facet disease. Upper chest: No acute findings Other: None IMPRESSION: Fracture through the C4 vertebral body in the inferior anterior corner at C3. 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 Critical Value/emergent results were called by telephone at the time of interpretation on 01/31/2022 at 6:57 pm to provider Sherwood Gambler , who verbally acknowledged these results. Electronically Signed   By: Rolm Baptise M.D.   On: 01/31/2022 19:05   DG Knee Complete 4 Views Left  Result Date: 01/31/2022 CLINICAL DATA:  Fall, left side pain EXAM: LEFT KNEE - COMPLETE 4+ VIEW COMPARISON:  None Available. FINDINGS: Slight joint space narrowing in the medial and lateral compartments.  No acute bony abnormality. Specifically, no fracture, subluxation, or dislocation. No joint effusion. IMPRESSION: No acute bony abnormality. Electronically Signed   By: Rolm Baptise M.D.   On: 01/31/2022 18:56   DG Hand Complete Left  Result Date: 01/31/2022 CLINICAL DATA:  Fall, left side pain EXAM: LEFT HAND - COMPLETE 3+ VIEW COMPARISON:  None Available. FINDINGS: No acute bony abnormality. Specifically, no fracture, subluxation, or dislocation. Degenerative changes in the IP joints, most pronounced in the 2nd and 3rd DIP joints as well as the 1st carpometacarpal joint. Soft tissues are intact. IMPRESSION: No acute bony abnormality. Electronically Signed   By: Rolm Baptise M.D.   On: 01/31/2022 18:56   DG Hips Bilat W or Wo Pelvis 2 Views  Result Date: 01/31/2022 CLINICAL DATA:  Fall, left side pain EXAM: DG HIP (WITH OR WITHOUT PELVIS) 2V BILAT COMPARISON:  None Available. FINDINGS: Symmetric degenerative changes in the hips with joint space narrowing and spurring. SI joints symmetric. No acute bony abnormality. Specifically, no fracture, subluxation, or  dislocation. IMPRESSION: No acute bony abnormality. Electronically Signed   By: Rolm Baptise M.D.   On: 01/31/2022 18:57    Procedures .Critical Care Performed by: Sherwood Gambler, MD Authorized by: Sherwood Gambler, MD   Critical care provider statement:    Critical care time (minutes):  30   Critical care time was exclusive of:  Separately billable procedures and treating other patients   Critical care was necessary to treat or prevent imminent or life-threatening deterioration of the following conditions:  CNS failure or compromise   Critical care was time spent personally by me on the following activities:  Development of treatment plan with patient or surrogate, discussions with consultants, evaluation of patient's response to treatment, examination of patient, ordering and review of laboratory studies, ordering and review of radiographic studies, ordering and performing treatments and interventions, pulse oximetry, re-evaluation of patient's condition and review of old charts    Medications Ordered in ED Medications  lisinopril (ZESTRIL) tablet 40 mg (has no administration in time range)  sodium chloride flush (NS) 0.9 % injection 3 mL (has no administration in time range)  sodium chloride flush (NS) 0.9 % injection 3 mL (has no administration in time range)  0.9 %  sodium chloride infusion (has no administration in time range)  acetaminophen (TYLENOL) tablet 650 mg (has no administration in time range)    Or  acetaminophen (TYLENOL) suppository 650 mg (has no administration in time range)  oxyCODONE (Oxy IR/ROXICODONE) immediate release tablet 5 mg (has no administration in time range)  oxyCODONE (Oxy IR/ROXICODONE) immediate release tablet 10 mg (has no administration in time range)  HYDROmorphone (DILAUDID) injection 1 mg (has no administration in time range)  cyclobenzaprine (FLEXERIL) tablet 10 mg (has no administration in time range)  docusate sodium (COLACE) capsule 100 mg (has  no administration in time range)  polyethylene glycol (MIRALAX / GLYCOLAX) packet 17 g (has no administration in time range)  ondansetron (ZOFRAN) tablet 4 mg (has no administration in time range)    Or  ondansetron (ZOFRAN) injection 4 mg (has no administration in time range)  menthol-cetylpyridinium (CEPACOL) lozenge 3 mg (has no administration in time range)    Or  phenol (CHLORASEPTIC) mouth spray 1 spray (has no administration in time range)  fentaNYL (SUBLIMAZE) injection 50 mcg (50 mcg Intravenous Given 01/31/22 1937)    ED Course/ Medical Decision Making/ A&P  Medical Decision Making Amount and/or Complexity of Data Reviewed Independent Historian:     Details: daughter External Data Reviewed: notes. Labs: ordered. Radiology: ordered and independent interpretation performed.  Risk Prescription drug management. Decision regarding hospitalization.   Shortly after arrival upon hearing about her tingling/arm symptoms, I asked for a c-collar from the nursing staff.  Afterwards, she had a CT head and C-spine which I personally viewed/interpreted and discussed with radiology.  She has a C4 fracture.  I am concerned about spinal cord injury as well such as contusion given the bilateral arm symptoms.  She is able to hold them up against gravity when I lift them up but it seems like moving them causes pain that cannot be reproduced on her musculoskeletal exam.  She has intact sensation.  Legs seem better.  She has some skin abrasions that do not require laceration repair at this point.  No fractures noted on her extremities on x-rays.  At this point, she was given IV fentanyl for pain as she originally had declined pain medicine.  I discussed with Dr. Zada Finders and he accepts for admission and will get an MRI when she gets admitted to [facility].        Final Clinical Impression(s) / ED Diagnoses Final diagnoses:  Closed displaced fracture of fourth cervical  vertebra, unspecified fracture morphology, initial encounter [facility])    Rx / DC Orders ED Discharge Orders     None         Sherwood Gambler, MD 01/31/22 2206

## 2022-01-31 NOTE — Plan of Care (Signed)

## 2022-01-31 NOTE — ED Notes (Signed)
C-Collar placed on patient.

## 2022-01-31 NOTE — ED Triage Notes (Signed)
She states she tripped (didn't pass out) at home; and fell into her storm door on her left side. She c/o pain "everywhere", but chiefly at left lat. Forehead area. She is awake, alert and oriented x 4 with clear speech.

## 2022-02-01 ENCOUNTER — Inpatient Hospital Stay (HOSPITAL_COMMUNITY): Payer: Medicare Other

## 2022-02-01 LAB — TYPE AND SCREEN
ABO/RH(D): O POS
Antibody Screen: NEGATIVE

## 2022-02-01 MED ORDER — GABAPENTIN 300 MG PO CAPS
300.0000 mg | ORAL_CAPSULE | Freq: Three times a day (TID) | ORAL | Status: DC
  Administered 2022-02-01 – 2022-02-05 (×10): 300 mg via ORAL
  Filled 2022-02-01 (×11): qty 1

## 2022-02-01 MED ORDER — LIP MEDEX EX OINT: TOPICAL_OINTMENT | CUTANEOUS | Status: DC | PRN

## 2022-02-01 MED ORDER — SODIUM CHLORIDE 0.9 % IV SOLN: INTRAVENOUS | Status: DC

## 2022-02-01 MED ORDER — CEFAZOLIN SODIUM-DEXTROSE 2-4 GM/100ML-% IV SOLN
2.0000 g | INTRAVENOUS | Status: AC
  Administered 2022-02-02: 2 g via INTRAVENOUS

## 2022-02-01 NOTE — Progress Notes (Signed)
CCMD notified me that Shelby Fleming's HR was sustaining 120 SR.  I checked in with the patient, she was resting with her daughter at bedside. Daughter wanted [name] to sleep a little longer. After daughter's departure I went to assess patient, she lethargic, responds to light touch, soft spoken. 02 was not hooked up to patient. After placing patient on oxygen 3l/min, her saturations were 93-96%, changed 02 sensor.   Patient has not ate food since breakfast, she did want to rest.  External catheter suction canister was empty, son in law at bedside said that she has not peed this shift to his knowledge. Bladder scan of patient revealed 550m  I discussed this with on call [facility] provider JFenton Malling He gave verbal orders for a foley catheter for peri-operative use, as patient is scheduled to go to the OR tomorrow. He also ordered continuous IV fluids 0.9% Sodium Chloride 768mhr.

## 2022-02-01 NOTE — Progress Notes (Signed)
Cardiac monitoring called and informed me that Tiaria had an episode of bradycardia with p waves,  no QRS complex following. Rate 40-50.  This occurred at 5am-6am.  Patient down in MRI, I immediately went to the MRI department to be with patient during her imaging.  Vonna Kotyk McDaniels PA notified of findings. He did order an EKG however patient was in NSR at that time.

## 2022-02-01 NOTE — H&P (Addendum)
Neurosurgery H&P  CC: Fall, BUE weakness / pain  HPI: This is a 76 y.o. woman that presents after a fall. Immediately afterwards, some temporary paresis then severe dysesthesias of BUE, none in BLE, which have continued. She is unsure about weakness as she has severe dysesthetic pain with any movement of BUE. No Sx like this before, ha d prior ACDF for neck pain w/o myelopathy with RWN.  No recent use of anti-platelet or anti-coagulant medications. Prior to the fall, no symptoms of myelopathy, but does have severe osteoarthritis that gives her some difficulty with fine motor function in the hands - able to button buttons / etc but just slower than usual.   ROS: A 14 point ROS was performed and is negative except as noted in the HPI.   PMHx:  Past Medical History:  Diagnosis Date   Hypertension    Psoriasis    FamHx: History reviewed. No pertinent family history. SocHx:  reports that she has never smoked. She has never used smokeless tobacco. She reports that she does not drink alcohol and does not use drugs.  Exam: Vital signs in last 24 hours: Temp:  [98.3 F (36.8 C)-98.4 F (36.9 C)] 98.3 F (36.8 C) (05/27 2111) Pulse Rate:  [77-86] 86 (05/27 1945) Resp:  [14-18] 14 (05/27 1945) BP: (121-129)/(55-76) 128/55 (05/27 2111) SpO2:  [93 %-100 %] 93 % (05/27 2111) Weight:  [89.1 kg] 89.1 kg (05/27 2111) General: Awake, alert, cooperative, lying in bed in NAD Head: Normocephalic HEENT: In well-fitting C-collar, readjusted during exam to provide better support Pulmonary: breathing room air comfortably, no evidence of increased work of breathing Cardiac: RRR Abdomen: S NT ND Extremities: Warm and well perfused x4, L patellar abrasion w/ dressing Neuro: AOx3, PERRL, EOMI, FS Strength pain-limited in BUE but at least 4-/5, BLE 4+/5, mild R Hoffman's, none on left, sensation dec'd in BUE, intact in BLE, no ankle clonus, reflexes 2+ in BLE   Assessment and Plan: 76 y.o. 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 CT C-spine personally reviewed, which shows prior ACDF with severe VB fracture at C4 with an ant/inf chip frx of C3, unstable fracture pattern, C4-5 likely posterior calcified disc herniation causing canal stenosis.   -discussed w/ the pt, will get an MRI C-spine to eval for cord signal change / canal stenosis before developing a surgical plan, but likely surgical given the fracture and injury pattern -no OR today/tomorrow plan, can have regular diet -start gabapentin 300tid for dysesthesias  Judith Part, MD 02/01/22 12:38 AM [facility]

## 2022-02-01 NOTE — Progress Notes (Signed)
Subjective: Patient reports significant neck pain with movement, and more severe bilateral arm and hand pains with pins and needles sensation/  Objective: Vital signs in last 24 hours: Temp:  [98 F (36.7 C)-98.4 F (36.9 C)] 98 F (36.7 C) (05/28 0415) Pulse Rate:  [77-86] 86 (05/27 1945) Resp:  [14-18] 14 (05/27 1945) BP: (121-129)/(55-76) 128/55 (05/27 2111) SpO2:  [93 %-100 %] 93 % (05/27 2111) Weight:  [89.1 kg] 89.1 kg (05/27 2111)  Intake/Output from previous day: 05/27 0701 - 05/28 0700 In: 7.4 [I.V.:7.4] Out: -  Intake/Output this shift: No intake/output data recorded.  Awake, alert, appears uncomfortable Dysphonic Having some issues with swallowing C-collar in place Allodynia in upper extremities limits strength exam.  Hand grip 3/5 bilaterally LEs 4/5 strength  Lab Results: Recent Labs    01/31/22 1941  WBC 9.9  HGB 10.5*  HCT 33.4*  PLT 307   BMET Recent Labs    01/31/22 1941  NA 138  K 3.8  CL 103  CO2 25  GLUCOSE 172*  BUN 22  CREATININE 0.97  CALCIUM 9.3    Studies/Results: CT Head Wo Contrast  Result Date: 01/31/2022 CLINICAL DATA:  Fall EXAM: CT HEAD WITHOUT CONTRAST TECHNIQUE: Contiguous axial images were obtained from the base of the skull through the vertex without intravenous contrast. RADIATION DOSE REDUCTION: This exam was performed according to the departmental dose-optimization program which includes automated exposure control, adjustment of the mA and/or kV according to patient size and/or use of iterative reconstruction technique. COMPARISON:  None Available. FINDINGS: Brain: No acute intracranial abnormality. Specifically, no hemorrhage, hydrocephalus, mass lesion, acute infarction, or significant intracranial injury. Vascular: No hyperdense vessel or unexpected calcification. Skull: No acute calvarial abnormality. Sinuses/Orbits: No acute findings Other: None IMPRESSION: No acute intracranial abnormality. Electronically Signed   By:  Rolm Baptise M.D.   On: 01/31/2022 18:57   CT Cervical Spine Wo Contrast  Result Date: 01/31/2022 CLINICAL DATA:  Fall EXAM: CT CERVICAL SPINE WITHOUT CONTRAST TECHNIQUE: Multidetector CT imaging of the cervical spine was performed without intravenous contrast. Multiplanar CT image reconstructions were also generated. RADIATION DOSE REDUCTION: This exam was performed according to the departmental dose-optimization program which includes automated exposure control, adjustment of the mA and/or kV according to patient size and/or use of iterative reconstruction technique. COMPARISON:  None Available. FINDINGS: Alignment: No subluxation Skull base and vertebrae: Fracture noted through the C4 vertebral body. This extends superiorly to involve the anterior inferior corner/spur at C3. Soft tissues and spinal canal: Mild prevertebral soft tissue swelling anterior to the C3 and C4 vertebral bodies. Central canal stenosis noted at C4-5 due to chronic appearing calcified disc herniation/bulge. Disc levels: Prior anterior fusion from C4-C6. Diffuse advanced degenerative disc disease and facet disease. Upper chest: No acute findings Other: None IMPRESSION: Fracture through the C4 vertebral body in the inferior anterior corner at C3. 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 Critical Value/emergent results were called by telephone at the time of interpretation on 01/31/2022 at 6:57 pm to provider Sherwood Gambler , who verbally acknowledged these results. Electronically Signed   By: Rolm Baptise M.D.   On: 01/31/2022 19:05   MR CERVICAL SPINE WO CONTRAST  Result Date: 02/01/2022 CLINICAL DATA:  Follow-up cervical spine fracture EXAM: MRI CERVICAL SPINE WITHOUT CONTRAST TECHNIQUE: Multiplanar, multisequence MR imaging of the cervical spine was performed. No intravenous contrast was administered. COMPARISON:  CT from yesterday FINDINGS: Alignment: Straightening of the upper cervical spine, unchanged  Vertebrae:  Known oblique fracture through the C4 body involving both endplates and directed towards the anterior inferior corner of C3 where there is a fracture and visible cleft at the anterior longitudinal ligament on sagittal T2 weighted imaging. No visible posterior element involvement or posterior ligamentous disruption. Prevertebral swelling as expected. C5-6 and C6-7 ACDF with solid arthrodesis at C5-6 at least. No hardware failure by prior CT. Cord: Cord deformity from herniation at C3-4 and especially C4-5. No cord edema. Posterior Fossa, vertebral arteries, paraspinal tissues: Prevertebral edema as noted above. Disc levels: C2-3: Degenerative facet spurring asymmetric to the right. Mild right foraminal narrowing C3-4: Bilateral paracentral protrusion and buttressing osteophytes extending to the uncovertebral joints with biforaminal impingement. Spinal stenosis is mild with mild ventral cord indentation C4-5: Disc narrowing and bulging with broad central protrusion flattening the cord. Continuation of foraminal disc greater on the left with bilateral uncovertebral spurring and foraminal impingement. C5-6: ACDF.  Patent canal and foramina C6-7: ACDF.  Patent canal and foramina C7-T1:Degenerative facet spurring and left eccentric ridging with foraminal protrusion. Advanced left foraminal impingement. Intermittent motion artifact. IMPRESSION: 1. Known C4 body and C3 anterior inferior corner fractures. The anterior longitudinal ligament is disrupted at the level of the C3 corner fracture. No visible injury to the posterior elements. 2. C4-5 cord flattening primarily from central disc protrusion. 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 3. Biforaminal impingement at C3-4 and C4-5. Left foraminal impingement at C7-T1. 4. C5-C7 ACDF. Electronically Signed   By: Jorje Guild M.D.   On: 02/01/2022 09:21   DG Knee Complete 4 Views Left  Result Date: 01/31/2022 CLINICAL DATA:  Fall, left side pain EXAM:  LEFT KNEE - COMPLETE 4+ VIEW COMPARISON:  None Available. FINDINGS: Slight joint space narrowing in the medial and lateral compartments. No acute bony abnormality. Specifically, no fracture, subluxation, or dislocation. No joint effusion. IMPRESSION: No acute bony abnormality. Electronically Signed   By: Rolm Baptise M.D.   On: 01/31/2022 18:56   DG Hand Complete Left  Result Date: 01/31/2022 CLINICAL DATA:  Fall, left side pain EXAM: LEFT HAND - COMPLETE 3+ VIEW COMPARISON:  None Available. FINDINGS: No acute bony abnormality. Specifically, no fracture, subluxation, or dislocation. Degenerative changes in the IP joints, most pronounced in the 2nd and 3rd DIP joints as well as the 1st carpometacarpal joint. Soft tissues are intact. IMPRESSION: No acute bony abnormality. Electronically Signed   By: Rolm Baptise M.D.   On: 01/31/2022 18:56   DG Hips Bilat W or Wo Pelvis 2 Views  Result Date: 01/31/2022 CLINICAL DATA:  Fall, left side pain EXAM: DG HIP (WITH OR WITHOUT PELVIS) 2V BILAT COMPARISON:  None Available. FINDINGS: Symmetric degenerative changes in the hips with joint space narrowing and spurring. SI joints symmetric. No acute bony abnormality. Specifically, no fracture, subluxation, or dislocation. IMPRESSION: No acute bony abnormality. Electronically Signed   By: Rolm Baptise M.D.   On: 01/31/2022 18:57    Assessment/Plan: 76 yo F with hx of C5-7 ACDF with Dr. Sherwood Gambler who has a coronal C4 fracture and ALL injury, as well as central cord syndrome mainly from large posterior disc-osteophytes at C4-5 with cord impingement - I had a long discussion with the patient and family.  Given her severe C4 body fracture adjacent to her fusion segment with disruption of the ALL, with accompanying central cord syndrome and ongoing stenosis, she will need surgical fixation and decompression, best accomplished with C4 corpectomy and C3-5 anterior fusion with removal of previous plate.  I  discussed the procedure  in detail, including general technique, risks, benefits, alternatives, and expected convalescence.  Risks discussed included but were not limited to, bleeding, pain, infection, scar, dysphagia, dysphonia, neurologic deficit, and death.  In particular, with her age, neck trauma with preoperative prevertebral swelling and previous plate and scar, she is at high risk of postoperative dysphagia. Additionally, I expect her central cord syndrome symptoms will take at least several weeks to get better postoperatively.  Nevertheless, this surgery will still give her ultimately the best chance of recovery and long term quality of life.  All questions and concerns were answered.  She wished to proceed and informed consent was obtained.   Vallarie Mare 02/01/2022, 11:27 AM

## 2022-02-02 ENCOUNTER — Inpatient Hospital Stay (HOSPITAL_COMMUNITY): Payer: Medicare Other

## 2022-02-02 ENCOUNTER — Inpatient Hospital Stay (HOSPITAL_COMMUNITY)
Admission: EM | Disposition: A | Discharge status: Rehabilitation Facility | Payer: Self-pay | Source: Home / Self Care | Attending: Neurosurgery

## 2022-02-02 ENCOUNTER — Inpatient Hospital Stay (HOSPITAL_COMMUNITY): Payer: Medicare Other | Admitting: Certified Registered"

## 2022-02-02 ENCOUNTER — Encounter (HOSPITAL_COMMUNITY): Payer: Self-pay | Admitting: Neurological Surgery

## 2022-02-02 ENCOUNTER — Other Ambulatory Visit: Payer: Self-pay

## 2022-02-02 DIAGNOSIS — D649 Anemia, unspecified: Secondary | ICD-10-CM | POA: Diagnosis not present

## 2022-02-02 DIAGNOSIS — S12300A Unspecified displaced fracture of fourth cervical vertebra, initial encounter for closed fracture: Secondary | ICD-10-CM | POA: Diagnosis not present

## 2022-02-02 DIAGNOSIS — I10 Essential (primary) hypertension: Secondary | ICD-10-CM

## 2022-02-02 DIAGNOSIS — S14104A Unspecified injury at C4 level of cervical spinal cord, initial encounter: Secondary | ICD-10-CM | POA: Diagnosis not present

## 2022-02-02 HISTORY — PX: ANTERIOR CERVICAL DECOMP/DISCECTOMY FUSION: SHX1161

## 2022-02-02 LAB — ABO/RH: ABO/RH(D): O POS

## 2022-02-02 LAB — SURGICAL PCR SCREEN
MRSA, PCR: NEGATIVE
Staphylococcus aureus: NEGATIVE

## 2022-02-02 SURGERY — ANTERIOR CERVICAL DECOMPRESSION/DISCECTOMY FUSION 2 LEVEL/HARDWARE REMOVAL
Anesthesia: General | Site: Spine Cervical

## 2022-02-02 MED ORDER — ACETAMINOPHEN 650 MG RE SUPP: 650.0000 mg | RECTAL | Status: DC | PRN

## 2022-02-02 MED ORDER — LIDOCAINE 2% (20 MG/ML) 5 ML SYRINGE
INTRAMUSCULAR | Status: DC | PRN
  Administered 2022-02-02: 100 mg via INTRAVENOUS

## 2022-02-02 MED ORDER — SODIUM CHLORIDE 0.9% FLUSH: 3.0000 mL | INTRAVENOUS | Status: DC | PRN

## 2022-02-02 MED ORDER — OXYCODONE HCL 5 MG PO TABS: 5.0000 mg | ORAL_TABLET | Freq: Once | ORAL | Status: DC | PRN

## 2022-02-02 MED ORDER — DOCUSATE SODIUM 100 MG PO CAPS
100.0000 mg | ORAL_CAPSULE | Freq: Two times a day (BID) | ORAL | Status: DC
  Administered 2022-02-05 – 2022-02-06 (×3): 100 mg via ORAL
  Filled 2022-02-02 (×9): qty 1

## 2022-02-02 MED ORDER — SUFENTANIL CITRATE 50 MCG/ML IV SOLN
INTRAVENOUS | Status: AC
  Filled 2022-02-02: qty 1

## 2022-02-02 MED ORDER — OXYCODONE HCL 5 MG/5ML PO SOLN: 5.0000 mg | Freq: Once | ORAL | Status: DC | PRN

## 2022-02-02 MED ORDER — SODIUM CHLORIDE 0.9 % IV SOLN: 250.0000 mL | INTRAVENOUS | Status: DC

## 2022-02-02 MED ORDER — ENOXAPARIN SODIUM 40 MG/0.4ML IJ SOSY
40.0000 mg | PREFILLED_SYRINGE | INTRAMUSCULAR | Status: DC
  Administered 2022-02-03 – 2022-02-11 (×9): 40 mg via SUBCUTANEOUS
  Filled 2022-02-02 (×9): qty 0.4

## 2022-02-02 MED ORDER — BUPIVACAINE HCL (PF) 0.5 % IJ SOLN
INTRAMUSCULAR | Status: AC
  Filled 2022-02-02: qty 30

## 2022-02-02 MED ORDER — THROMBIN (RECOMBINANT) 5000 UNITS EX SOLR: CUTANEOUS | Status: DC | PRN

## 2022-02-02 MED ORDER — CEFAZOLIN SODIUM-DEXTROSE 2-4 GM/100ML-% IV SOLN
INTRAVENOUS | Status: AC
  Filled 2022-02-02: qty 100

## 2022-02-02 MED ORDER — PROPOFOL 10 MG/ML IV BOLUS
INTRAVENOUS | Status: AC
  Filled 2022-02-02: qty 20

## 2022-02-02 MED ORDER — POLYETHYLENE GLYCOL 3350 17 G PO PACK: 17.0000 g | PACK | Freq: Every day | ORAL | Status: DC | PRN

## 2022-02-02 MED ORDER — ACETAMINOPHEN 325 MG PO TABS
650.0000 mg | ORAL_TABLET | ORAL | Status: DC | PRN
  Administered 2022-02-03 – 2022-02-06 (×7): 650 mg via ORAL
  Filled 2022-02-02 (×6): qty 2

## 2022-02-02 MED ORDER — THROMBIN 5000 UNITS EX SOLR
CUTANEOUS | Status: AC
  Filled 2022-02-02: qty 15000

## 2022-02-02 MED ORDER — CEFAZOLIN SODIUM-DEXTROSE 1-4 GM/50ML-% IV SOLN
1.0000 g | Freq: Three times a day (TID) | INTRAVENOUS | Status: AC
  Administered 2022-02-02 – 2022-02-03 (×3): 1 g via INTRAVENOUS
  Filled 2022-02-02 (×3): qty 50

## 2022-02-02 MED ORDER — LIDOCAINE-EPINEPHRINE 1 %-1:100000 IJ SOLN
INTRAMUSCULAR | Status: DC | PRN
  Administered 2022-02-02: 3.5 mL

## 2022-02-02 MED ORDER — ONDANSETRON HCL 4 MG PO TABS: 4.0000 mg | ORAL_TABLET | Freq: Four times a day (QID) | ORAL | Status: DC | PRN

## 2022-02-02 MED ORDER — PHENYLEPHRINE HCL (PRESSORS) 10 MG/ML IV SOLN
INTRAVENOUS | Status: DC | PRN
  Administered 2022-02-02: 160 ug via INTRAVENOUS

## 2022-02-02 MED ORDER — MENTHOL 3 MG MT LOZG
1.0000 | LOZENGE | OROMUCOSAL | Status: DC | PRN
Start: 2022-02-02 — End: 2022-02-11

## 2022-02-02 MED ORDER — 0.9 % SODIUM CHLORIDE (POUR BTL) OPTIME
TOPICAL | Status: DC | PRN
  Administered 2022-02-02: 1000 mL

## 2022-02-02 MED ORDER — LIDOCAINE-EPINEPHRINE 1 %-1:100000 IJ SOLN
INTRAMUSCULAR | Status: AC
  Filled 2022-02-02: qty 1

## 2022-02-02 MED ORDER — HYDROMORPHONE HCL 1 MG/ML IJ SOLN
0.5000 mg | INTRAMUSCULAR | Status: DC | PRN
  Administered 2022-02-02 – 2022-02-03 (×2): 0.5 mg via INTRAVENOUS
  Filled 2022-02-02 (×3): qty 0.5

## 2022-02-02 MED ORDER — SODIUM CHLORIDE 0.9 % IV SOLN: INTRAVENOUS | Status: DC | PRN

## 2022-02-02 MED ORDER — ONDANSETRON HCL 4 MG/2ML IJ SOLN: 4.0000 mg | Freq: Once | INTRAMUSCULAR | Status: DC | PRN

## 2022-02-02 MED ORDER — PHENOL 1.4 % MT LIQD
1.0000 | OROMUCOSAL | Status: DC | PRN
  Filled 2022-02-02: qty 177

## 2022-02-02 MED ORDER — OXYCODONE HCL 5 MG PO TABS: 10.0000 mg | ORAL_TABLET | ORAL | Status: DC | PRN

## 2022-02-02 MED ORDER — SUFENTANIL CITRATE 50 MCG/ML IV SOLN
INTRAVENOUS | Status: DC | PRN
  Administered 2022-02-02: 10 ug via INTRAVENOUS

## 2022-02-02 MED ORDER — ONDANSETRON HCL 4 MG/2ML IJ SOLN
4.0000 mg | Freq: Four times a day (QID) | INTRAMUSCULAR | Status: DC | PRN
Start: 2022-02-02 — End: 2022-02-11

## 2022-02-02 MED ORDER — ROCURONIUM 10MG/ML (10ML) SYRINGE FOR MEDFUSION PUMP - OPTIME
INTRAVENOUS | Status: DC | PRN
  Administered 2022-02-02: 100 mg via INTRAVENOUS

## 2022-02-02 MED ORDER — THROMBIN 5000 UNITS EX SOLR: OROMUCOSAL | Status: DC | PRN

## 2022-02-02 MED ORDER — POTASSIUM CHLORIDE IN NACL 20-0.9 MEQ/L-% IV SOLN
INTRAVENOUS | Status: DC
Start: 2022-02-02 — End: 2022-02-07
  Filled 2022-02-02: qty 1000

## 2022-02-02 MED ORDER — HYDROMORPHONE HCL 1 MG/ML IJ SOLN: 0.2500 mg | INTRAMUSCULAR | Status: DC | PRN

## 2022-02-02 MED ORDER — LACTATED RINGERS IV SOLN: INTRAVENOUS | Status: DC | PRN

## 2022-02-02 MED ORDER — CYCLOBENZAPRINE HCL 10 MG PO TABS
10.0000 mg | ORAL_TABLET | Freq: Three times a day (TID) | ORAL | Status: DC | PRN
  Administered 2022-02-03 – 2022-02-08 (×12): 10 mg via ORAL
  Filled 2022-02-02 (×13): qty 1

## 2022-02-02 MED ORDER — SUGAMMADEX SODIUM 200 MG/2ML IV SOLN
INTRAVENOUS | Status: DC | PRN
  Administered 2022-02-02: 200 mg via INTRAVENOUS

## 2022-02-02 MED ORDER — DEXAMETHASONE SODIUM PHOSPHATE 10 MG/ML IJ SOLN
INTRAMUSCULAR | Status: DC | PRN
  Administered 2022-02-02: 10 mg via INTRAVENOUS

## 2022-02-02 MED ORDER — PROPOFOL 10 MG/ML IV BOLUS
INTRAVENOUS | Status: DC | PRN
  Administered 2022-02-02: 80 mg via INTRAVENOUS

## 2022-02-02 MED ORDER — FLEET ENEMA 7-19 GM/118ML RE ENEM: 1.0000 | ENEMA | Freq: Once | RECTAL | Status: DC | PRN

## 2022-02-02 MED ORDER — DIPHENHYDRAMINE HCL 12.5 MG/5ML PO ELIX
12.5000 mg | ORAL_SOLUTION | ORAL | Status: DC | PRN
  Administered 2022-02-02: 12.5 mg via ORAL
  Filled 2022-02-02: qty 10

## 2022-02-02 MED ORDER — SUCCINYLCHOLINE CHLORIDE 200 MG/10ML IV SOSY
PREFILLED_SYRINGE | INTRAVENOUS | Status: DC | PRN
  Administered 2022-02-02: 120 mg via INTRAVENOUS

## 2022-02-02 MED ORDER — BUPIVACAINE HCL 0.5 % IJ SOLN
INTRAMUSCULAR | Status: DC | PRN
  Administered 2022-02-02: 3.5 mL

## 2022-02-02 MED ORDER — SODIUM CHLORIDE 0.9% FLUSH
3.0000 mL | Freq: Two times a day (BID) | INTRAVENOUS | Status: DC
Start: 2022-02-02 — End: 2022-02-11
  Administered 2022-02-02 – 2022-02-10 (×12): 3 mL via INTRAVENOUS

## 2022-02-02 MED ORDER — TRAMADOL HCL 50 MG PO TABS
50.0000 mg | ORAL_TABLET | Freq: Four times a day (QID) | ORAL | Status: DC
  Administered 2022-02-02 – 2022-02-11 (×30): 50 mg via ORAL
  Filled 2022-02-02 (×31): qty 1

## 2022-02-02 MED ORDER — PHENYLEPHRINE HCL-NACL 20-0.9 MG/250ML-% IV SOLN
INTRAVENOUS | Status: DC | PRN
  Administered 2022-02-02: 50 ug/min via INTRAVENOUS

## 2022-02-02 MED ORDER — OXYCODONE HCL 5 MG PO TABS: 5.0000 mg | ORAL_TABLET | ORAL | Status: DC | PRN

## 2022-02-02 MED ORDER — ONDANSETRON HCL 4 MG/2ML IJ SOLN
INTRAMUSCULAR | Status: DC | PRN
  Administered 2022-02-02: 4 mg via INTRAVENOUS

## 2022-02-02 SURGICAL SUPPLY — 57 items
APL SKNCLS STERI-STRIP NONHPOA (GAUZE/BANDAGES/DRESSINGS) ×1
BAG COUNTER SPONGE SURGICOUNT (BAG) ×2 IMPLANT
BAG SPNG CNTER NS LX DISP (BAG) ×1
BAND INSRT 18 STRL LF DISP RB (MISCELLANEOUS) ×2
BAND RUBBER #18 3X1/16 STRL (MISCELLANEOUS) ×4 IMPLANT
BASKET BONE COLLECTION (BASKET) ×1 IMPLANT
BENZOIN TINCTURE PRP APPL 2/3 (GAUZE/BANDAGES/DRESSINGS) ×1 IMPLANT
BIT DRILL NEURO 2X3.1 SFT TUCH (MISCELLANEOUS) ×1 IMPLANT
BUR MATCHSTICK NEURO 3.0 LAGG (BURR) ×2 IMPLANT
CAGE CERV CAPRI 7D 12X14X23 (Cage) ×1 IMPLANT
CANISTER SUCT 3000ML PPV (MISCELLANEOUS) ×2 IMPLANT
DRAPE C-ARM 42X72 X-RAY (DRAPES) ×4 IMPLANT
DRAPE HALF SHEET 40X57 (DRAPES) ×1 IMPLANT
DRAPE LAPAROTOMY 100X72 PEDS (DRAPES) ×2 IMPLANT
DRAPE MICROSCOPE LEICA (MISCELLANEOUS) ×2 IMPLANT
DRILL NEURO 2X3.1 SOFT TOUCH (MISCELLANEOUS) ×2
DRSG OPSITE 4X5.5 SM (GAUZE/BANDAGES/DRESSINGS) ×4 IMPLANT
DRSG OPSITE POSTOP 4X6 (GAUZE/BANDAGES/DRESSINGS) ×1 IMPLANT
DURAPREP 26ML APPLICATOR (WOUND CARE) ×2 IMPLANT
ELECT COATED BLADE 2.86 ST (ELECTRODE) ×2 IMPLANT
ELECT REM PT RETURN 9FT ADLT (ELECTROSURGICAL) ×2
ELECTRODE REM PT RTRN 9FT ADLT (ELECTROSURGICAL) ×1 IMPLANT
EVACUATOR 1/8 PVC DRAIN (DRAIN) ×1 IMPLANT
GLOVE BIOGEL PI IND STRL 7.5 (GLOVE) ×1 IMPLANT
GLOVE BIOGEL PI INDICATOR 7.5 (GLOVE) ×2
GLOVE ECLIPSE 7.5 STRL STRAW (GLOVE) ×3 IMPLANT
GLOVE SURG ENC MOIS LTX SZ8 (GLOVE) ×3 IMPLANT
GLOVE SURG UNDER POLY LF SZ8.5 (GLOVE) ×3 IMPLANT
GOWN STRL REUS W/ TWL LRG LVL3 (GOWN DISPOSABLE) ×2 IMPLANT
GOWN STRL REUS W/ TWL XL LVL3 (GOWN DISPOSABLE) ×1 IMPLANT
GOWN STRL REUS W/TWL 2XL LVL3 (GOWN DISPOSABLE) IMPLANT
GOWN STRL REUS W/TWL LRG LVL3 (GOWN DISPOSABLE) ×4
GOWN STRL REUS W/TWL XL LVL3 (GOWN DISPOSABLE) ×6
HEMOSTAT POWDER KIT SURGIFOAM (HEMOSTASIS) ×2 IMPLANT
KIT BASIN OR (CUSTOM PROCEDURE TRAY) ×2 IMPLANT
KIT TURNOVER KIT B (KITS) ×2 IMPLANT
NDL SPNL 22GX3.5 QUINCKE BK (NEEDLE) ×1 IMPLANT
NEEDLE HYPO 22GX1.5 SAFETY (NEEDLE) ×2 IMPLANT
NEEDLE SPNL 22GX3.5 QUINCKE BK (NEEDLE) ×2 IMPLANT
NS IRRIG 1000ML POUR BTL (IV SOLUTION) ×2 IMPLANT
PACK LAMINECTOMY NEURO (CUSTOM PROCEDURE TRAY) ×2 IMPLANT
PIN DISTRACTION 14MM (PIN) ×2 IMPLANT
PLATE CERV CONS OZARK 2X44 (Plate) ×1 IMPLANT
RASP 3.0MM (RASP) ×1 IMPLANT
SCREW CANN 95X16X8 NS (Screw) IMPLANT
SCREW CERV ST OZARK 4.5X16 (Screw) ×4 IMPLANT
SCREW VA ST OZARK 4X16 (Plate) ×2 IMPLANT
SPONGE INTESTINAL PEANUT (DISPOSABLE) ×2 IMPLANT
SPONGE SURGIFOAM ABS GEL SZ50 (HEMOSTASIS) ×2 IMPLANT
STAPLER VISISTAT 35W (STAPLE) IMPLANT
STRIP CLOSURE SKIN 1/2X4 (GAUZE/BANDAGES/DRESSINGS) ×2 IMPLANT
SUT MNCRL AB 4-0 PS2 18 (SUTURE) ×2 IMPLANT
SUT VIC AB 3-0 SH 8-18 (SUTURE) ×2 IMPLANT
TAPE CLOTH 3X10 TAN LF (GAUZE/BANDAGES/DRESSINGS) ×2 IMPLANT
TOWEL GREEN STERILE (TOWEL DISPOSABLE) ×2 IMPLANT
TOWEL GREEN STERILE FF (TOWEL DISPOSABLE) ×2 IMPLANT
WATER STERILE IRR 1000ML POUR (IV SOLUTION) ×2 IMPLANT

## 2022-02-02 NOTE — Plan of Care (Signed)

## 2022-02-02 NOTE — Anesthesia Procedure Notes (Signed)
Procedure Name: Intubation Date/Time: 02/02/2022 8:44 AM Performed by: Claris Che, CRNA Pre-anesthesia Checklist: Patient identified, Emergency Drugs available, Suction available, Patient being monitored and Timeout performed Patient Re-evaluated:Patient Re-evaluated prior to induction Oxygen Delivery Method: Circle system utilized Preoxygenation: Pre-oxygenation with 100% oxygen Induction Type: IV induction, Rapid sequence and Cricoid Pressure applied Laryngoscope Size: Glidescope Grade View: Grade II Tube type: Oral Tube size: 7.5 mm Number of attempts: 1 Airway Equipment and Method: Stylet Placement Confirmation: ETT inserted through vocal cords under direct vision, positive ETCO2 and breath sounds checked- equal and bilateral Secured at: 22 cm Tube secured with: Tape Dental Injury: Teeth and Oropharynx as per pre-operative assessment

## 2022-02-02 NOTE — Progress Notes (Signed)
   02/02/22 0845  Clinical Encounter Type  Visited With Family;Patient not available (Daughter: [name] Pasko-Stroud)  Visit Type Patient in surgery;Initial  Referral From Patient (Met Daughter [name] in surgical family waiting area)  Consult/Referral To Chaplain Albertina Parr [employer])  Recommendations Deep Loss  Spiritual Encounters  Spiritual Needs Emotional   While rounding met patient's Daughter: [name] Stills-Stroud in the surgical family area. Very tearful and experiencing much sorrow associated with mother's traumatic injury. Chaplain provided meaningful presence and provided space for daughter to express her "loss and grief."  [address] [place] Other., [phone]

## 2022-02-02 NOTE — Anesthesia Preprocedure Evaluation (Addendum)
Anesthesia Evaluation  Patient identified by MRN, date of birth, ID band Patient awake    Reviewed: Allergy & Precautions, NPO status , Patient's Chart, lab work & pertinent test results  Airway Mallampati: II  TM Distance: >3 FB Neck ROM: Limited    Dental no notable dental hx.    Pulmonary neg pulmonary ROS,    Pulmonary exam normal breath sounds clear to auscultation       Cardiovascular hypertension, Pt. on medications Normal cardiovascular exam Rhythm:Regular Rate:Normal     Neuro/Psych negative neurological ROS  negative psych ROS   GI/Hepatic negative GI ROS, Neg liver ROS,   Endo/Other  negative endocrine ROS  Renal/GU negative Renal ROS  negative genitourinary   Musculoskeletal negative musculoskeletal ROS (+)   Abdominal   Peds negative pediatric ROS (+)  Hematology  (+) Blood dyscrasia, anemia ,   Anesthesia Other Findings   Reproductive/Obstetrics negative OB ROS                             Anesthesia Physical Anesthesia Plan  ASA: 3 and emergent  Anesthesia Plan: General   Post-op Pain Management: Tylenol PO (pre-op)*   Induction: Intravenous  PONV Risk Score and Plan: 3 and Ondansetron, Dexamethasone and Treatment may vary due to age or medical condition  Airway Management Planned: Oral ETT and Video Laryngoscope Planned  Additional Equipment:   Intra-op Plan:   Post-operative Plan: Extubation in OR  Informed Consent: I have reviewed the patients History and Physical, chart, labs and discussed the procedure including the risks, benefits and alternatives for the proposed anesthesia with the patient or authorized representative who has indicated his/her understanding and acceptance.     Dental advisory given  Plan Discussed with: CRNA and Surgeon  Anesthesia Plan Comments:        Anesthesia Quick Evaluation

## 2022-02-02 NOTE — Transfer of Care (Signed)
Immediate Anesthesia Transfer of Care Note  Patient: Shelby Fleming  Procedure(s) Performed: CERVICAL FOUR CORPECTOMY WITH PLATE REMOVAL (Spine Cervical)  Patient Location: PACU  Anesthesia Type:General  Level of Consciousness: drowsy, patient cooperative and responds to stimulation  Airway & Oxygen Therapy: Patient Spontanous Breathing and Patient connected to nasal cannula oxygen  Post-op Assessment: Report given to RN, Post -op Vital signs reviewed and stable and Patient moving all extremities X 4  Post vital signs: Reviewed and stable  Last Vitals:  Vitals Value Taken Time  BP 125/59 02/02/22 1135  Temp    Pulse 97 02/02/22 1138  Resp 23 02/02/22 1138  SpO2 91 % 02/02/22 1138  Vitals shown include unvalidated device data.  Last Pain:  Vitals:   02/01/22 2310  TempSrc: Oral  PainSc:          Complications: No notable events documented.

## 2022-02-02 NOTE — Op Note (Addendum)
PREOP DIAGNOSIS: C4 fracture with spinal cord injury  POSTOP DIAGNOSIS: C4 fracture with spinal cord injury   PROCEDURE: 1. C4 corpectomy, anterior interbody technique, including discectomies and bilateral foraminotomies  2. Placement of Corpectomy cage C4 3 . Placement of anterior instrumentation consisting of cervical plate and screws S5-0-5 6. Use of morselized bone allograft  7. Use of intraoperative microscope  SURGEON: Dr. Duffy Rhody, MD  ASSISTANT: Weston Brass, NP.  Please note, no qualified trainees were available to assist with the procedure.  Assistance was required for retraction of the visceral structures to safely allow for instrumentation.  ANESTHESIA: General Endotracheal  EBL: 200 ml  IMPLANTS: [employer]  SPECIMENS: None  DRAINS: None  COMPLICATIONS: None immediate  CONDITION: Hemodynamically stable to PACU  HISTORY: This is a 76 yo F with hx of C5-7 ACDF with Dr. Sherwood Gambler who has a coronal C4 fracture and ALL injury, as well as central cord syndrome mainly from large posterior disc-osteophytes at C4-5 with cord impingement.  Risks, benefits, alternatives, and expected convalescence were discussed with the patient.  Risks discussed included but were not limited to bleeding, pain, infection, pseudoarthrosis, hardware failure, adjacent segment disease, CSF leak, neurologic deficits, dysphagia, dysphonia, weakness, numbness, paralysis, coma, and death. After all questions were answered, informed consent was obtained.  PROCEDURE IN DETAIL: The patient was brought to the operating room and transferred to the operative table. After induction of general anesthesia, the patient was positioned on the operative table in the supine position with all pressure points meticulously padded. The skin of the neck was then prepped and draped in the usual sterile fashion.  After timeout was conducted, the skin was infiltrated with local anesthetic. Skin incision was then made  sharply on the left side of the neck above her previous scar and Bovie electrocautery was used to dissect the subcutaneous tissue until the platysma was identified. The platysma was then divided and undermined. The sternocleidomastoid muscle was then identified and, utilizing natural fascial planes in the neck, the prevertebral fascia was identified and the carotid sheath was retracted laterally and the trachea and esophagus retracted medially. The prevertebral fascia was swollen with significant blood behind it.  Large fracture fragment protuding outward from C3-4 was noted.  Bovie electrocautery was used to dissect in the subperiosteal plane and elevate the bilateral longus coli muscles. Previous anterior cervical plate was uncovered.  Overlying osteophytes were removed.  Self-retaining retractors were then placed. The plate was cut with a drill above the middle screw and the superior screws in C5 were removed, and the superior portion of the plate removed. Caspar distraction pins were placed in C3 and C5 bodies to allow for gentle distraction.  At this point, the microscope was draped and brought into the field, and the remainder of the case was done under the microscope using microdissecting technique.  Discectomies were performed at C3-4 and C5-6. The C4 body was removed with Leksell rongeur and high speed drill. Anterior half of the body came out in one piece thanks to the fracture.The high-speed drill was then used to complete discectomy until the posterior annulus was identified and removed and the posterior longitudinal ligament was identified. Using a nerve hook, the PLL was elevated, and Kerrison rongeurs were used to remove the posterior longitudinal ligament and the ventral thecal sac was identified. Using a combination of curettes and rongeurs, complete decompression of the thecal sac and exiting nerve roots at each level was completed, and verified with easy passage of micro-nerve hook centrally  and  in the bilateral foramina.  Having completed our decompression, attention was turned to placement of the intervertebral device. Trial spacers were used to select a size 23 mm Capri static cage. This cage was filled with autograft, and inserted under live fluoroscopy.  The caspar pins were removed.  An anterior cervical plate was placed across the interspaces for anterior fixation.  Previous C5 screw holes were used to place oversized screws.  Using a high-speed drill, the cortex of the C3 vertebral body was punctured, and screws inserted in the vertebral bodies. Final fluoroscopic images in AP and lateral projections were taken to confirm good hardware placement.  At this point, after all counts were verified to be correct, meticulous hemostasis was secured using a combination of bipolar electrocautery and passive hemostatics. A medium hemovac drain was placed, tunneled out the skin, and secured.  The platysma muscle was then closed using interrupted 3-0 Vicryl sutures, and the skin was closed with a 4-0 monocryl in subcuticular fashion. Sterile dressings were then applied and the drapes removed.  The patient tolerated the procedure well and was extubated in the room and taken to the postanesthesia care unit in stable condition.  All counts were correct at the end of the procedure.

## 2022-02-02 NOTE — Plan of Care (Signed)

## 2022-02-02 NOTE — Anesthesia Postprocedure Evaluation (Signed)
Anesthesia Post Note  Patient: Shelby Fleming  Procedure(s) Performed: CERVICAL FOUR CORPECTOMY WITH PLATE REMOVAL (Spine Cervical)     Patient location during evaluation: PACU Anesthesia Type: General Level of consciousness: awake and alert Pain management: pain level controlled Vital Signs Assessment: post-procedure vital signs reviewed and stable Respiratory status: spontaneous breathing, nonlabored ventilation, respiratory function stable and patient connected to nasal cannula oxygen Cardiovascular status: blood pressure returned to baseline and stable Postop Assessment: no apparent nausea or vomiting Anesthetic complications: no   No notable events documented.  Last Vitals:  Vitals:   02/02/22 1255 02/02/22 1256  BP:    Pulse: 96 96  Resp: (!) 22 (!) 24  Temp:    SpO2: (!) 89% 90%    Last Pain:  Vitals:   02/02/22 1245  TempSrc: Oral  PainSc: 0-No pain                 Fatmata Legere S

## 2022-02-02 NOTE — Progress Notes (Signed)
   02/02/22 1450  Clinical Encounter Type  Visited With Patient and family together (son-in-law)  Visit Type Follow-up;Spiritual support;Post-op  Referral From Chaplain (Met with daughter earlier)  Consult/Referral To Chaplain Albertina Parr [employer])  Spiritual Encounters  Spiritual Needs Emotional;Grief support;Prayer   Post-Operative visit with patient as follow-up. Met Ms. Shelby Fleming at patient's bedside, her son-in-law was also present during the time of visit. Chaplain provided meaningful presence, along with emotion and spiritual support following the patient's surgery. At patient's request shared in intercessory prayer for healing, comfort, reassurance, and family blessings.  [address] [place], [place] [phone].

## 2022-02-02 NOTE — Progress Notes (Signed)
Pt seen and examined  Exam unchanged.  C/o neck pain, bilateral arm and hand pain.  Grip strength 3/5.  Limited B, T strength by pain, 3/5.  C-collar in place  - OR today for corpectomy

## 2022-02-03 MED ORDER — DIPHENHYDRAMINE HCL 25 MG PO CAPS
25.0000 mg | ORAL_CAPSULE | Freq: Four times a day (QID) | ORAL | Status: DC | PRN
  Administered 2022-02-03: 25 mg via ORAL
  Filled 2022-02-03 (×2): qty 1

## 2022-02-03 MED FILL — Thrombin For Soln 5000 Unit: CUTANEOUS | Qty: 2 | Status: AC

## 2022-02-03 NOTE — Care Management Important Message (Signed)
Important Message  Patient Details  Name: Shelby Fleming MRN: [id number] Date of Birth: 14-Jan-1946   Medicare Important Message Given:  Yes     Orbie Pyo 02/03/2022, 3:31 PM

## 2022-02-03 NOTE — Plan of Care (Signed)
  Problem: Health Behavior/Discharge Planning: Goal: Ability to manage health-related needs will improve Outcome: Progressing   Problem: Activity: Goal: Risk for activity intolerance will decrease Outcome: Progressing   Problem: Nutrition: Goal: Adequate nutrition will be maintained Outcome: Progressing   Problem: Coping: Goal: Level of anxiety will decrease Outcome: Progressing   

## 2022-02-03 NOTE — Progress Notes (Signed)
Subjective: Patient reports that she is doing well. She has appropriate surgical pain. BUE paresthesia has resolved. She continues to have "soreness" in her BUE but has improved since surgery. Mild sore throat. No dysaphia. No acute events overnight.   Objective: Vital signs in last 24 hours: Temp:  [97.5 F (36.4 C)-98.4 F (36.9 C)] 97.5 F (36.4 C) (05/30 0815) Pulse Rate:  [88-102] 97 (05/30 0815) Resp:  [14-27] 15 (05/30 0815) BP: (108-139)/(51-68) 130/58 (05/30 0815) SpO2:  [80 %-100 %] 97 % (05/30 0815)  Intake/Output from previous day: 05/29 0701 - 05/30 0700 In: 3042.8 [P.O.:240; I.V.:2502.8; IV Piggyback:200] Out: 2250 [Urine:900; Drains:1150; Blood:200] Intake/Output this shift: No intake/output data recorded.  Physical Exam: Patient is awake, A/O X 4, conversant, and in good spirits. Doing well. Speech is fluent and appropriate. MAEW. BUE 4-/5 except for grip strength 3/5 on the left and 4-/5 on the right. BLE 4+/5 throughout. Sensation to light touch is intact. PERLA, EOMI. CNs grossly intact. Dressing is clean dry intact. Incision is well approximated with no drainage, erythema, or edema. Hemovac with approximately 200 ml of sanguineous  drainage. Hard cervical collar in place   Lab Results: Recent Labs    01/31/22 1941  WBC 9.9  HGB 10.5*  HCT 33.4*  PLT 307   BMET Recent Labs    01/31/22 1941  NA 138  K 3.8  CL 103  CO2 25  GLUCOSE 172*  BUN 22  CREATININE 0.97  CALCIUM 9.3    Studies/Results: DG Cervical Spine 2 or 3 views  Result Date: 02/02/2022 CLINICAL DATA:  C4 corpectomy and plate removal. EXAM: CERVICAL SPINE - 2-3 VIEW COMPARISON:  02/01/2022 FINDINGS: PA and lateral C-arm images of the cervical spine demonstrate interval C4 corpectomy and strut placement. New anterior plate and screws extending from the C3 to the C5 level. Normal alignment. The upper part of the previously seen C5-C7 plate and screws has been removed with the inferior  portion remaining at the C6-7 level. This is not visible on the lateral view due to overlapping of the patient's shoulders. IMPRESSION: Operative changes, as described above. Electronically Signed   By: Claudie Revering M.D.   On: 02/02/2022 13:02   DG Cervical Spine 2 or 3 views  Result Date: 02/01/2022 CLINICAL DATA:  Cervical myelopathy. EXAM: CERVICAL SPINE - 2-3 VIEW COMPARISON:  MRI cervical spine 02/01/2022, CT cervical spine 01/31/2022 FINDINGS: The patient is imaged in a cervical collar. Known oblique fracture within the mid to anterior C4 vertebral body from anterior superior to posteroinferior orientation. Additional oblique linear fracture through the anterior inferior corner of the C3 vertebral body with mild displacement of the anterior inferior corner of the C3 vertebral body, similar to prior CT. No significant retropulsion. Status post C5 through C7 ACDF with osseous fusion at C5-6 and disc space narrowing but non fusion at C6-7 unchanged. The atlantodens interval is intact. Mild prevertebral soft tissue swelling. IMPRESSION: Known mid and anterior C4 and anterior inferior C3 acute vertebral body fractures. Electronically Signed   By: Yvonne Kendall M.D.   On: 02/01/2022 13:54   MR CERVICAL SPINE WO CONTRAST  Result Date: 02/01/2022 CLINICAL DATA:  Follow-up cervical spine fracture EXAM: MRI CERVICAL SPINE WITHOUT CONTRAST TECHNIQUE: Multiplanar, multisequence MR imaging of the cervical spine was performed. No intravenous contrast was administered. COMPARISON:  CT from yesterday FINDINGS: Alignment: Straightening of the upper cervical spine, unchanged Vertebrae: Known oblique fracture through the C4 body involving both endplates and directed towards the  anterior inferior corner of C3 where there is a fracture and visible cleft at the anterior longitudinal ligament on sagittal T2 weighted imaging. No visible posterior element involvement or posterior ligamentous disruption. Prevertebral swelling  as expected. C5-6 and C6-7 ACDF with solid arthrodesis at C5-6 at least. No hardware failure by prior CT. Cord: Cord deformity from herniation at C3-4 and especially C4-5. No cord edema. Posterior Fossa, vertebral arteries, paraspinal tissues: Prevertebral edema as noted above. Disc levels: C2-3: Degenerative facet spurring asymmetric to the right. Mild right foraminal narrowing C3-4: Bilateral paracentral protrusion and buttressing osteophytes extending to the uncovertebral joints with biforaminal impingement. Spinal stenosis is mild with mild ventral cord indentation C4-5: Disc narrowing and bulging with broad central protrusion flattening the cord. Continuation of foraminal disc greater on the left with bilateral uncovertebral spurring and foraminal impingement. C5-6: ACDF.  Patent canal and foramina C6-7: ACDF.  Patent canal and foramina C7-T1:Degenerative facet spurring and left eccentric ridging with foraminal protrusion. Advanced left foraminal impingement. Intermittent motion artifact. IMPRESSION: 1. Known C4 body and C3 anterior inferior corner fractures. The anterior longitudinal ligament is disrupted at the level of the C3 corner fracture. No visible injury to the posterior elements. 2. C4-5 cord flattening primarily from central disc protrusion. 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 3. Biforaminal impingement at C3-4 and C4-5. Left foraminal impingement at C7-T1. 4. C5-C7 ACDF. Electronically Signed   By: Jorje Guild M.D.   On: 02/01/2022 09:21   DG C-Arm 1-60 Min-No Report  Result Date: 02/02/2022 Fluoroscopy was utilized by the requesting physician.  No radiographic interpretation.   DG C-Arm 1-60 Min-No Report  Result Date: 02/02/2022 Fluoroscopy was utilized by the requesting physician.  No radiographic interpretation.   DG C-Arm 1-60 Min-No Report  Result Date: 02/02/2022 Fluoroscopy was utilized by the requesting physician.  No radiographic interpretation.     Assessment/Plan: Patient is post-op day 1 s/p C4 corpectomy with C3-5 anterior fusion due to C4 fracture with spinal cord injury. She is recovering well and reports a reduction of her preoperative symptoms.  Her only complaint is mild incisional, shoulder and upper back discomfort.  She is awaiting PT/OT evaluation.  Continue hard cervical collar. Continue working on pain control, mobility and ambulating patient. Will leave drain in for today and will likely discontinue tomorrow.   -Continue hard cervical collar -Pain control -PT/OT     LOS: 3 days     Marvis Moeller, DNP, AGNP-C Neurosurgery Nurse Practitioner  [facility] [address], [place], [place] [address] P: [phone]    F: [phone]  02/03/2022 8:19 AM

## 2022-02-03 NOTE — TOC CAGE-AID Note (Signed)
Transition of Care [facility]) - CAGE-AID Screening   Patient Details  Name: Derrisha Foos MRN: [id number] Date of Birth: [birth year]  Transition of Care [facility]) CM/SW Contact:    Gaetano Hawthorne Tarpley-Carter, LCSWA Phone Number: 02/03/2022, 1:47 PM   Clinical Narrative: Pt participated in [place].  Pt stated she does not use substance or ETOH.  Pt was not offered resources, due to no usage of substance or ETOH.    Denver Harder Tarpley-Carter, MSW, LCSW-A Pronouns:  She/Her/Hers [facility] Clinical Social Worker Direct Number:  [phone] [email or web address]  CAGE-AID Screening:    Have You Ever Felt You Ought to Cut Down on Your Drinking or Drug Use?: No Have People Annoyed You By [employer] Your Drinking Or Drug Use?: No Have You Felt Bad Or Guilty About Your Drinking Or Drug Use?: No Have You Ever Had a Drink or Used Drugs First Thing In The Morning to Steady Your Nerves or to Get Rid of a Hangover?: No CAGE-AID Score: 0  Substance Abuse Education Offered: No

## 2022-02-03 NOTE — Evaluation (Signed)
Occupational Therapy Evaluation Patient Details Name: Shelby Fleming MRN: [id number] DOB: 1946/03/01 Today's Date: 02/03/2022   History of Present Illness Pt is a 76 y/o female who admitted after a mechanical fall at home with altered sensation of BUE. 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 Pt underwent C4 corpectomy and C3-5 anterior fusion with removal of previous plate on 5/17. PMH: prior ACDF, HTN, psoriasis.   Clinical Impression   PTA, pt lives alone, typically Independent in all daily tasks without need for AD. Pt presents now with deficits in sensation, coordination, standing balance, pain and strength. Daughter present, plans to stay with pt at DC and hands on to assist throughout session. Overall, pt requires Max A for bed mobility, Mod A for transfers using RW with anterior bias noted. Pt requires Mod A for UB ADL and Max A for LB ADLs due to deficits. Educated pt/family re: cervical precautions for ADLs (handout provided), cervical brace recommendations, DME needs, and hands on assist and use of gait belt for all mobility. Feel pt would benefit from inpatient rehab though pt's daughter opting to take pt home and agreeable for [facility] therapy services. Daughter plans to purchase shower chair and possibly bedrails for home use at DC. Will continue to follow acutely to decrease fall risk with ADLs/mobility.       Recommendations for follow up therapy are one component of a multi-disciplinary discharge planning process, led by the attending physician.  Recommendations may be updated based on patient status, additional functional criteria and insurance authorization.   Follow Up Recommendations  Home health OT (daughter declining inpatient rehab; plans to take pt home)    Assistance Recommended at Discharge Frequent or constant Supervision/Assistance  Patient can return home with the following A  lot of help with walking and/or transfers;A lot of help with bathing/dressing/bathroom;Assistance with cooking/housework;Assist for transportation;Help with stairs or ramp for entrance    Functional Status Assessment  Patient has had a recent decline in their functional status and demonstrates the ability to make significant improvements in function in a reasonable and predictable amount of time.  Equipment Recommendations  BSC/3in1;Other (comment) (Rolling walker)    Recommendations for Other Services       Precautions / Restrictions Precautions Precautions: Fall;Cervical Precaution Booklet Issued: Yes (comment) Required Braces or Orthoses: Cervical Brace Cervical Brace: Hard collar;At all times;Other (comment) (can remove for showering only) Restrictions Weight Bearing Restrictions: No      Mobility Bed Mobility Overal bed mobility: Needs Assistance Bed Mobility: Rolling, Sidelying to Sit Rolling: Mod assist Sidelying to sit: Max assist       General bed mobility comments: Mod A to roll to side with tactile cues to assist in every aspect. Max A to bring LEs off of bed and lift trunk. daughter present to guide trunk in back/for safety    Transfers Overall transfer level: Needs assistance Equipment used: Rolling walker (2 wheels) Transfers: Sit to/from Stand, Bed to chair/wheelchair/BSC Sit to Stand: Min assist     Step pivot transfers: Mod assist     General transfer comment: Min A to fully bring bottom up to standing, anterior bias and shuffling steps towards chair. Hands on assist needed in all aspects to guide RW and maintain balance. Mod A x 1 or Min A x 2 with daughter present and hands on to assist      Balance Overall balance assessment: Needs assistance  Sitting-balance support: No upper extremity supported, Feet supported Sitting balance-Leahy Scale: Fair   Postural control: Other (comment) (anterior bias) Standing balance support: Bilateral upper  extremity supported, During functional activity, Reliant on assistive device for balance Standing balance-Leahy Scale: Poor Standing balance comment: anterior bias in standing, requiring assist to maintain                           ADL either performed or assessed with clinical judgement   ADL Overall ADL's : Needs assistance/impaired Eating/Feeding: Set up;Sitting   Grooming: Minimal assistance;Sitting   Upper Body Bathing: Moderate assistance;Sitting   Lower Body Bathing: Maximal assistance;Sit to/from stand   Upper Body Dressing : Moderate assistance;Sitting   Lower Body Dressing: Maximal assistance;Sit to/from stand   Toilet Transfer: Moderate assistance;Stand-pivot;Rolling walker (2 wheels);BSC/3in1   Toileting- Clothing Manipulation and Hygiene: Maximal assistance;Sit to/from stand         General ADL Comments: Pt with decreased UE sensation, impaired standing balance with anterior bias using RW. requires hands on assist to decrease fall risk     Vision Baseline Vision/History: 1 Wears glasses Ability to See in Adequate Light: 1 Impaired Patient Visual Report: No change from baseline Vision Assessment?: No apparent visual deficits     Perception     Praxis      Pertinent Vitals/Pain Pain Assessment Pain Assessment: Faces Faces Pain Scale: Hurts little more Pain Location: L wrist/hand; neck Pain Descriptors / Indicators: Grimacing, Guarding Pain Intervention(s): Monitored during session, Premedicated before session, Limited activity within patient's tolerance     Hand Dominance Right   Extremity/Trunk Assessment Upper Extremity Assessment Upper Extremity Assessment: LUE deficits/detail;RUE deficits/detail RUE Deficits / Details: reports pins and needles sensation has resolved and numbness improving. RUE Sensation: decreased light touch RUE Coordination: decreased fine motor LUE Deficits / Details: reports pins and needles sensation has resolved  and numbness improving. reports pain in wrist/knuckles - daughter feels this UE may be sprained as pt fell to this side LUE Sensation: decreased light touch LUE Coordination: decreased fine motor   Lower Extremity Assessment Lower Extremity Assessment: Defer to PT evaluation   Cervical / Trunk Assessment Cervical / Trunk Assessment: Neck Surgery   Communication Communication Communication: No difficulties   Cognition Arousal/Alertness: Awake/alert Behavior During Therapy: WFL for tasks assessed/performed, Flat affect Overall Cognitive Status: Impaired/Different from baseline Area of Impairment: Awareness, Problem solving, Following commands                       Following Commands: Follows one step commands with increased time   Awareness: Emergent Problem Solving: Slow processing, Difficulty sequencing, Requires verbal cues, Requires tactile cues General Comments: pleasant, follows directions consistently but benefits from increased time for processing/sequencing     General Comments  Spo2 94-96% on RA, desats to 88% when at rest in chair - reapplied the 2 L O2 at end of session.    Exercises     Shoulder Instructions      Home Living Family/patient expects to be discharged to:: Private residence Living Arrangements: Alone Available Help at Discharge: Family;Available 24 hours/day Type of Home: House Home Access: Stairs to enter [employer] of Steps: 2 at front without rails; 4 at deck that has rails Entrance Stairs-Rails: None Home Layout: One level     Bathroom Shower/Tub: Occupational psychologist: Standard     Home Equipment: None   Additional Comments: daughter plans to stay with  pt      Prior Functioning/Environment Prior Level of Function : Independent/Modified Independent;Driving             Mobility Comments: no use of AD ADLs Comments: Independent in all daily tasks, active at baseline        OT Problem List:  Decreased strength;Decreased activity tolerance;Impaired balance (sitting and/or standing);Decreased cognition;Decreased safety awareness;Decreased knowledge of use of DME or AE;Decreased knowledge of precautions;Impaired UE functional use;Impaired sensation      OT Treatment/Interventions: Self-care/ADL training;Therapeutic exercise;Energy conservation;DME and/or AE instruction;Therapeutic activities;Patient/family education;Balance training    OT Goals(Current goals can be found in the care plan section) Acute Rehab OT Goals Patient Stated Goal: recover well, go home OT Goal Formulation: With patient/family Time For Goal Achievement: 02/17/22 Potential to Achieve Goals: Good  OT Frequency: Min 3X/week    Co-evaluation              AM-PAC OT "6 Clicks" Daily Activity     Outcome Measure Help from another person eating meals?: A Little Help from another person taking care of personal grooming?: A Little Help from another person toileting, which includes using toliet, bedpan, or urinal?: A Lot Help from another person bathing (including washing, rinsing, drying)?: A Lot Help from another person to put on and taking off regular upper body clothing?: A Lot Help from another person to put on and taking off regular lower body clothing?: A Lot 6 Click Score: 14   End of Session Equipment Utilized During Treatment: Rolling walker (2 wheels);Gait belt;Oxygen;Cervical collar Nurse Communication: Mobility status  Activity Tolerance: Patient tolerated treatment well Patient left: in chair;with call bell/phone within reach;with chair alarm set;with family/visitor present  OT Visit Diagnosis: Unsteadiness on feet (R26.81);Other abnormalities of gait and mobility (R26.89);Muscle weakness (generalized) (M62.81)                Time: 3810-1751 OT Time Calculation (min): 41 min Charges:  OT General Charges $OT Visit: 1 Visit OT Evaluation $OT Eval Moderate Complexity: 1 Mod OT  Treatments $Self Care/Home Management : 8-22 mins $Therapeutic Activity: 8-22 mins  Malachy Chamber, OTR/L Acute Rehab Services Office: [phone]   Layla Maw 02/03/2022, 9:15 AM

## 2022-02-03 NOTE — Plan of Care (Signed)

## 2022-02-03 NOTE — Evaluation (Signed)
Physical Therapy Evaluation  Patient Details Name: Shelby Fleming MRN: [id number] DOB: [birth year] Today's Date: 02/03/2022  History of Present Illness  Pt is a 76 y/o female who admitted after a mechanical fall at home with altered sensation of BUE. 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 Pt underwent C4 corpectomy and C3-5 anterior fusion with removal of previous plate on 9/67. PMH: prior ACDF, HTN, psoriasis.   Clinical Impression  Pt admitted with above diagnosis. At the time of PT eval, pt was able to demonstrate transfers and ambulation with up to mod assist, with +2 helpful for line management and safety. Pt unsteady and with difficulty controlling steps/coordinating balance with RW. Pt and daughter report wanting to return home with family support in lieu of AIR. Feel this is reasonable as daughter appears very supportive and will be available 24/7 to assist. Would recommend home health follow up with transition to neuro outpatient when able. Pt was educated on precautions, positioning, brace application/wearing schedule. Pt currently with functional limitations due to the deficits listed below (see PT Problem List). Pt will benefit from skilled PT to increase their independence and safety with mobility to allow discharge to the venue listed below.         Recommendations for follow up therapy are one component of a multi-disciplinary discharge planning process, led by the attending physician.  Recommendations may be updated based on patient status, additional functional criteria and insurance authorization.  Follow Up Recommendations Home health PT (Pt refusing AIR)    Assistance Recommended at Discharge Frequent or constant Supervision/Assistance  Patient can return home with the following  A lot of help with walking and/or transfers;A lot of help with bathing/dressing/bathroom;Assistance  with feeding;Assist for transportation;Help with stairs or ramp for entrance    Equipment Recommendations Rolling walker (2 wheels);BSC/3in1  Recommendations for Other Services       Functional Status Assessment Patient has had a recent decline in their functional status and demonstrates the ability to make significant improvements in function in a reasonable and predictable amount of time.     Precautions / Restrictions Precautions Precautions: Fall;Cervical Precaution Booklet Issued: Yes (comment) Precaution Comments: Reviewed handout and pt was cued for precautions during functional mobility. Required Braces or Orthoses: Cervical Brace Cervical Brace: Hard collar;At all times;Other (comment) (Can remove for showering only) Restrictions Weight Bearing Restrictions: No      Mobility  Bed Mobility   Bed Mobility: Rolling, Sidelying to Sit Rolling: Mod assist Sidelying to sit: Mod assist, HOB elevated            Transfers                   General transfer comment: +2 for power up to full stand. Increased time required to trunk extension to neutral and manual assist required to help achieve full grip on RW due to strength deficits in B hands.    Ambulation/Gait           Gait velocity interpretation: 1.31 - 2.62 ft/sec, indicative of limited community ambulator      Stairs            Wheelchair Mobility    Modified Rankin (Stroke Patients Only)       Balance  Pertinent Vitals/Pain Pain Assessment Pain Assessment: Faces Faces Pain Scale: Hurts even more Pain Location: L wrist/hand; neck Pain Descriptors / Indicators: Grimacing, Guarding Pain Intervention(s): Limited activity within patient's tolerance, Monitored during session, Repositioned    Home Living Family/patient expects to be discharged to:: Private residence Living Arrangements: Alone Available Help at Discharge:  Family;Available 24 hours/day Type of Home: House Home Access: Stairs to enter Entrance Stairs-Rails: None Entrance Stairs-Number of Steps: 2 at front without rails; 4 at deck that has rails   Home Layout: One level Home Equipment: None Additional Comments: daughter plans to stay with pt    Prior Function Prior Level of Function : Independent/Modified Independent;Driving             Mobility Comments: no use of AD ADLs Comments: Independent in all daily tasks, active at baseline     Hand Dominance   Dominant Hand: Right    Extremity/Trunk Assessment   Upper Extremity Assessment Upper Extremity Assessment: RUE deficits/detail;LUE deficits/detail (grip strength 2/5, elbow flexion 3/5, elbox extension 3+/5) RUE Sensation: decreased light touch RUE Coordination: decreased fine motor LUE Deficits / Details: increased difficulty with thumb to finger movement LUE Sensation: decreased light touch LUE Coordination: decreased fine motor    Lower Extremity Assessment Lower Extremity Assessment: RLE deficits/detail;LLE deficits/detail (4/5 grossly) RLE Sensation: WNL RLE Coordination: WNL LLE Sensation: WNL LLE Coordination: WNL    Cervical / Trunk Assessment Cervical / Trunk Assessment: Neck Surgery  Communication   Communication: No difficulties  Cognition Arousal/Alertness: Awake/alert Behavior During Therapy: WFL for tasks assessed/performed, Flat affect Overall Cognitive Status: Impaired/Different from baseline Area of Impairment: Awareness, Problem solving, Following commands                       Following Commands: Follows one step commands with increased time   Awareness: Emergent Problem Solving: Slow processing, Difficulty sequencing, Requires verbal cues, Requires tactile cues General Comments: pleasant, follows directions consistently but benefits from increased time for processing/sequencing        General Comments      Exercises      Assessment/Plan    PT Assessment Patient needs continued PT services  PT Problem List Decreased strength;Decreased activity tolerance;Decreased balance;Decreased mobility;Decreased coordination;Decreased cognition;Decreased knowledge of use of DME;Decreased safety awareness;Decreased knowledge of precautions;Impaired sensation;Pain       PT Treatment Interventions DME instruction;Gait training;Stair training;Functional mobility training;Therapeutic activities;Therapeutic exercise;Balance training;Cognitive remediation;Patient/family education    PT Goals (Current goals can be found in the Care Plan section)  Acute Rehab PT Goals Patient Stated Goal: Return to PLOF PT Goal Formulation: With patient/family Time For Goal Achievement: 02/17/22 Potential to Achieve Goals: Good    Frequency Min 5X/week     Co-evaluation               AM-PAC PT "6 Clicks" Mobility  Outcome Measure Help needed turning from your back to your side while in a flat bed without using bedrails?: A Lot Help needed moving from lying on your back to sitting on the side of a flat bed without using bedrails?: A Lot Help needed moving to and from a bed to a chair (including a wheelchair)?: A Lot Help needed standing up from a chair using your arms (e.g., wheelchair or bedside chair)?: A Lot Help needed to walk in hospital room?: A Lot Help needed climbing 3-5 steps with a railing? : Total 6 Click Score: 11    End of Session Equipment Utilized During Treatment: Gait belt;Cervical  collar;Oxygen (O2 d/c during session per RN) Activity Tolerance: Patient tolerated treatment well Patient left: in chair;with call bell/phone within reach;with chair alarm set;with family/visitor present Nurse Communication: Mobility status PT Visit Diagnosis: Unsteadiness on feet (R26.81);Other symptoms and signs involving the nervous system (R29.898)    Time: 9444-6190 PT Time Calculation (min) (ACUTE ONLY): 36  min   Charges:   PT Evaluation $PT Eval Moderate Complexity: 1 Mod PT Treatments $Gait Training: 8-22 mins        Rolinda Roan, PT, DPT Acute Rehabilitation Services Secure Chat Preferred Office: [phone]   Thelma Comp 02/03/2022, 3:52 PM

## 2022-02-04 ENCOUNTER — Encounter (HOSPITAL_COMMUNITY): Payer: Self-pay | Admitting: Neurosurgery

## 2022-02-04 LAB — BASIC METABOLIC PANEL
Anion gap: 10 (ref 5–15)
BUN: 11 mg/dL (ref 8–23)
CO2: 23 mmol/L (ref 22–32)
Calcium: 8.5 mg/dL — ABNORMAL LOW (ref 8.9–10.3)
Chloride: 101 mmol/L (ref 98–111)
Creatinine, Ser: 0.77 mg/dL (ref 0.44–1.00)
GFR, Estimated: >60 mL/min (ref >60)
Glucose, Bld: 135 mg/dL — ABNORMAL HIGH (ref 70–99)
Potassium: 3.7 mmol/L (ref 3.5–5.1)
Sodium: 134 mmol/L — ABNORMAL LOW (ref 135–145)

## 2022-02-04 LAB — TROPONIN I (HIGH SENSITIVITY)
Troponin I (High Sensitivity): 8 ng/L (ref <18)
Troponin I (High Sensitivity): 8 ng/L (ref <18)

## 2022-02-04 MED ORDER — TRAMADOL HCL 50 MG PO TABS: 50.0000 mg | ORAL_TABLET | Freq: Four times a day (QID) | ORAL | Status: DC | PRN

## 2022-02-04 MED ORDER — HYDROMORPHONE HCL 1 MG/ML IJ SOLN: 0.5000 mg | INTRAMUSCULAR | Status: DC | PRN

## 2022-02-04 NOTE — Consult Note (Incomplete)
CARDIOLOGY CONSULT NOTE  Patient ID: Shelby Fleming MRN: [id number] DOB/AGE: 1946/06/26 76 y.o.  Admit date: 01/31/2022 Referring Physician: *** Primary Physician:  *** Reason for Consultation:  ***  HPI:   76 y.o. *** female  with ***  Past Medical History:  Diagnosis Date   Hypertension    Psoriasis      Past Surgical History:  Procedure Laterality Date   ABDOMINAL HYSTERECTOMY     ANTERIOR CERVICAL DECOMP/DISCECTOMY FUSION N/A 02/02/2022   Procedure: CERVICAL FOUR CORPECTOMY WITH PLATE REMOVAL;  Surgeon: Vallarie Mare, MD;  Location: [facility];  Service: Neurosurgery;  Laterality: N/A;   BACK SURGERY       ***History reviewed. No pertinent family history.   Social History: Social History   Socioeconomic History   Marital status: Single    Spouse name: Not on file   Number of children: Not on file   Years of education: Not on file   Highest education level: Not on file  Occupational History   Not on file  Tobacco Use   Smoking status: Never   Smokeless tobacco: Never  Substance and Sexual Activity   Alcohol use: No   Drug use: No   Sexual activity: Never    Birth control/protection: None  Other Topics Concern   Not on file  Social History Narrative   Not on file   Social Determinants of Health   Financial Resource Strain: Not on file  Food Insecurity: Not on file  Transportation Needs: Not on file  Physical Activity: Not on file  Stress: Not on file  Social Connections: Not on file  Intimate Partner Violence: Not on file     Medications Prior to Admission  Medication Sig Dispense Refill Last Dose   Apremilast (OTEZLA) 30 MG TABS Take 30 mg by mouth 2 (two) times daily.   01/31/2022   D 1000 25 MCG (1000 UT) capsule Take 1,000 Units by mouth daily.   01/31/2022   ibuprofen (ADVIL) 200 MG tablet Take 200 mg by mouth every 6 (six) hours as needed for mild pain.   unk   lisinopril (PRINIVIL,ZESTRIL) 40 MG tablet Take 40 mg by mouth daily.   01/31/2022    loratadine (CLARITIN) 10 MG tablet Take 10 mg by mouth daily.   01/31/2022   omega-3 acid ethyl esters (LOVAZA) 1 g capsule Take 2 g by mouth 2 (two) times daily.   01/31/2022    ROS    Physical Exam: Physical Exam     Imaging/tests reviewed and independently interpreted: Lab Results: ***  Cardiac Studies:  ***Telemetry ***  EKG ***:  Echocardiogram ***:  ***  Assessment & Recommendations:  ***  ***  ***Discussed interpretation of tests and management recommendations with the primary team     Nigel Mormon, MD Pager: [phone] Office: [phone]

## 2022-02-04 NOTE — Progress Notes (Signed)
Occupational Therapy Treatment Patient Details Name: Shelby Fleming MRN: [id number] DOB: [birth year] Today's Date: 02/04/2022   History of present illness Pt is a 76 y/o female who admitted after a mechanical fall at home with altered sensation of BUE. 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 Pt underwent C4 corpectomy and C3-5 anterior fusion with removal of previous plate on 1/09. PMH: prior ACDF, HTN, psoriasis.   OT comments  Pt progressing gradually towards OT goals, continues to require hands on assist for all standing activities due to balance and coordination deficits increasing fall risk. Overall, pt requires Mod A x 1 for BSC transfers and short mobility using RW though benefits from 2nd person being present for safety. Pt continues to require extensive assist for LB ADLs though daughter reports she can assist. Extended time spent educating re: fall prevention, safety strategies for LB ADLs, BSC transfers vs gait belt/ambulation assist to bathroom, and hand strengthening/coordination exercises (squeeze ball, putty and handout provided). Continue to feel pt would benefit from AIR level therapies though daughter planning stay with pt at home, agreeable for [facility] services at DC with transition to OP OT as appropriate.   Recommendations for follow up therapy are one component of a multi-disciplinary discharge planning process, led by the attending physician.  Recommendations may be updated based on patient status, additional functional criteria and insurance authorization.    Follow Up Recommendations  Home health OT (daughter declining inpatient rehab; plans to take pt home)    Assistance Recommended at Discharge Frequent or constant Supervision/Assistance  Patient can return home with the following  A lot of help with walking and/or transfers;A lot of help with  bathing/dressing/bathroom;Assistance with cooking/housework;Assist for transportation;Help with stairs or ramp for entrance   Equipment Recommendations  BSC/3in1;Other (comment) (Rolling walker)    Recommendations for Other Services      Precautions / Restrictions Precautions Precautions: Fall;Cervical Precaution Booklet Issued: Yes (comment) Required Braces or Orthoses: Cervical Brace Cervical Brace: Hard collar;At all times;Other (comment) (Can remove for showering only) Restrictions Weight Bearing Restrictions: No       Mobility Bed Mobility Overal bed mobility: Needs Assistance Bed Mobility: Supine to Sit Rolling: Min assist         General bed mobility comments: Able to sit straight up with Min A to lift trunk, cues to slowly walk LEs off of bed and keep spine straight    Transfers Overall transfer level: Needs assistance Equipment used: Rolling walker (2 wheels) Transfers: Sit to/from Stand, Bed to chair/wheelchair/BSC Sit to Stand: Min assist     Step pivot transfers: Mod assist     General transfer comment: Min A to stand from bedside and BSC, cues to push from armrests with increased balance assist needed for pt to lift hands to RW - did better when pulling on RW to stand. Mod A vs Min A x 2 for safety in stepping to [facility] with assist for RW manuevering, cues for clearance of steps and maintaining balance. Guided pt in short steps from [facility] to recliner with increased difficulty noted in turning and stepping backwards to recliner     Balance Overall balance assessment: Needs assistance Sitting-balance support: Bilateral upper extremity supported, Feet supported Sitting balance-Leahy Scale: Good     Standing balance support: Bilateral upper extremity supported, During functional activity, Reliant on assistive device for balance Standing balance-Leahy Scale: Poor Standing balance comment: reliance on RW  in standing                           ADL  either performed or assessed with clinical judgement   ADL Overall ADL's : Needs assistance/impaired Eating/Feeding: Set up;Sitting                       Toilet Transfer: Moderate assistance;Stand-pivot;Rolling walker (2 wheels);BSC/3in1 Toilet Transfer Details (indicate cue type and reason): cues for RW sequencing, correcting balance with posterior lean Toileting- Clothing Manipulation and Hygiene: Maximal assistance;Sit to/from stand Toileting - Clothing Manipulation Details (indicate cue type and reason): assist for peri care in standing     Functional mobility during ADLs: Moderate assistance;Rolling walker (2 wheels) General ADL Comments: Pt with continued balance and coordination deficits requiring hands on assist for all standing tasks. Extended time spent educating on fall prevention and caregiver assist for LB ADLs, BSC use vs gait belt and assisted walking to bathroom. Daughter reports husband has MS, only able to provide light assist if extra help needed. Provided squeeze ball, theraputty and theraputty handout for hand strength/coordination    Extremity/Trunk Assessment Upper Extremity Assessment Upper Extremity Assessment: RUE deficits/detail;LUE deficits/detail RUE Deficits / Details: reports pins and needles sensation has resolved and numbness improving. fair grasp RUE Sensation: decreased light touch RUE Coordination: decreased fine motor LUE Deficits / Details: increased difficulty with thumb to finger movement. fari grasp LUE Sensation: decreased light touch LUE Coordination: decreased fine motor   Lower Extremity Assessment Lower Extremity Assessment: Defer to PT evaluation        Vision   Vision Assessment?: No apparent visual deficits   Perception     Praxis      Cognition Arousal/Alertness: Awake/alert Behavior During Therapy: WFL for tasks assessed/performed, Flat affect Overall Cognitive Status: Impaired/Different from baseline Area of  Impairment: Awareness, Problem solving, Following commands                       Following Commands: Follows one step commands with increased time   Awareness: Emergent Problem Solving: Slow processing, Difficulty sequencing, Requires verbal cues, Requires tactile cues General Comments: pleasant, follows directions consistently but benefits from increased time for processing/sequencing        Exercises      Shoulder Instructions       General Comments Daughter present, still aiming for DC home; asking about drain removal    Pertinent Vitals/ Pain       Pain Assessment Pain Assessment: Faces Faces Pain Scale: Hurts little more Pain Location: neck, throat Pain Descriptors / Indicators: Sore Pain Intervention(s): Monitored during session, Premedicated before session  Home Living                                          Prior Functioning/Environment              Frequency  Min 3X/week        Progress Toward Goals  OT Goals(current goals can now be found in the care plan section)  Progress towards OT goals: Progressing toward goals  Acute Rehab OT Goals Patient Stated Goal: go home maybe tomorrow OT Goal Formulation: With patient/family Time For Goal Achievement: 02/17/22 Potential to Achieve Goals: Good ADL Goals Pt Will Perform Grooming: with set-up;standing Pt Will Perform Lower Body Bathing: with set-up;sit  to/from stand;sitting/lateral leans Pt Will Transfer to Toilet: with supervision;ambulating  Plan Discharge plan remains appropriate    Co-evaluation                 AM-PAC OT "6 Clicks" Daily Activity     Outcome Measure   Help from another person eating meals?: A Little Help from another person taking care of personal grooming?: A Little Help from another person toileting, which includes using toliet, bedpan, or urinal?: A Lot Help from another person bathing (including washing, rinsing, drying)?: A Lot Help  from another person to put on and taking off regular upper body clothing?: A Little Help from another person to put on and taking off regular lower body clothing?: A Lot 6 Click Score: 15    End of Session Equipment Utilized During Treatment: Gait belt;Rolling walker (2 wheels);Cervical collar  OT Visit Diagnosis: Unsteadiness on feet (R26.81);Other abnormalities of gait and mobility (R26.89);Muscle weakness (generalized) (M62.81)   Activity Tolerance Patient tolerated treatment well   Patient Left in chair;with call bell/phone within reach;with chair alarm set   Nurse Communication Mobility status        Time: 1011-1050 OT Time Calculation (min): 39 min  Charges: OT General Charges $OT Visit: 1 Visit OT Treatments $Self Care/Home Management : 23-37 mins $Therapeutic Activity: 8-22 mins  Malachy Chamber, OTR/L Acute Rehab Services Office: [phone]   Layla Maw 02/04/2022, 11:55 AM

## 2022-02-04 NOTE — Progress Notes (Signed)
Pt seen and examined.  Ambulating with walker.  No arm/hand burning pain which has resolved.  Feels strength returning to hands.  Still having some difficulty with eating well due to dysphagia.  Discussed plan of care with daughter who would like patient to continue recovery at home with her.  Possible discharge tomorrow.

## 2022-02-04 NOTE — Progress Notes (Signed)
Physical Therapy Treatment Patient Details Name: Shelby Fleming MRN: [id number] DOB: [birth year] Today's Date: 02/04/2022   History of Present Illness Pt is a 76 y/o female who admitted after a mechanical fall at home with altered sensation of BUE. 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 Pt underwent C4 corpectomy and C3-5 anterior fusion with removal of previous plate on 9/62. PMH: prior ACDF, HTN, psoriasis.    PT Comments    Pt lethargic this session and requiring increased time to engage with therapy. Pt is still complaining of soreness in arms but demonstrated increased strength and function in hands. Noted pt still with difficulty gripping utensils, foam provided. Pt still requires assistance for all mobility and still requiring manual assist during ambulation with multiple LOBs and decreased control. With discharge date approaching, would benefit from hands-on session with daughter for pt safety upon discharge. AIR remains the most appropriate discharge disposition due to functional limitations. Pt and daughter educated on recommendation of AIR upon discharge for more intensive rehab to decrease fall risk at home and maximize functional return. Pt and daughter feel confident about going home with daughter providing 24/7 care and supervision. However, based on performance today, do not feel that pt is ready for discharge from a PT standpoint.     Recommendations for follow up therapy are one component of a multi-disciplinary discharge planning process, led by the attending physician.  Recommendations may be updated based on patient status, additional functional criteria and insurance authorization.  Follow Up Recommendations  Home health PT     Assistance Recommended at Discharge Frequent or constant Supervision/Assistance  Patient can return home with the following A lot of help with walking  and/or transfers;A lot of help with bathing/dressing/bathroom;Assistance with feeding;Assist for transportation;Help with stairs or ramp for entrance   Equipment Recommendations  Rolling walker (2 wheels);BSC/3in1    Recommendations for Other Services       Precautions / Restrictions Precautions Precautions: Fall;Cervical Precaution Booklet Issued: Yes (comment) Required Braces or Orthoses: Cervical Brace Cervical Brace: Hard collar;At all times;Other (comment) Restrictions Weight Bearing Restrictions: No     Mobility  Bed Mobility Overal bed mobility: Needs Assistance Bed Mobility: Rolling, Sidelying to Sit, Sit to Sidelying Rolling: Max assist Sidelying to sit: Mod assist, +2 for physical assistance, HOB elevated     Sit to sidelying: Mod assist, +2 for physical assistance General bed mobility comments: cues and manual assist to reach for bed rail, trunk assist to roll and get to full sit. Pt with difficulty following log roll technique to maintain precautions    Transfers Overall transfer level: Needs assistance Equipment used: Rolling walker (2 wheels) Transfers: Sit to/from Stand Sit to Stand: Min assist, +2 physical assistance           General transfer comment: +2 for power up to full stand, cues for hand placement and upright posture. narrow base of support in standing    Ambulation/Gait Ambulation/Gait assistance: Mod assist, +2 safety/equipment Gait Distance (Feet): 75 Feet Assistive device: Rolling walker (2 wheels) Gait Pattern/deviations: Knees buckling, Shuffle, Ataxic, Step-through pattern, Knee flexed in stance - right, Knee flexed in stance - left Gait velocity: variable Gait velocity interpretation: 1.31 - 2.62 ft/sec, indicative of limited community ambulator Pre-gait activities: stepping forward at bedside with focus on heel strike for gait. Pt with difficulty understanding and completing task. General Gait Details: Cueing for heel strike. cues  to slow cadence and take longer steps, manual gait belt assit to slow patient. Cues for keeping RW close. multiple multidirectional LOBs with manual assist.   Stairs             Wheelchair Mobility    Modified Rankin (Stroke Patients Only)       Balance Overall balance assessment: Needs assistance Sitting-balance support: Bilateral upper extremity supported, Feet supported Sitting balance-Leahy Scale: Good Sitting balance - Comments: posterior lean with fatigue, cues to maintain upright posture during therex at EOB Postural control: Posterior lean Standing balance support: Bilateral upper extremity supported, During functional activity, Reliant on assistive device for balance Standing balance-Leahy Scale: Poor Standing balance comment: reliance on RW in standing, manual assist required                            Cognition Arousal/Alertness: Lethargic Behavior During Therapy: WFL for tasks assessed/performed, Flat affect Overall Cognitive Status: Impaired/Different from baseline Area of Impairment: Problem solving, Following commands                       Following Commands: Follows one step commands with increased time     Problem Solving: Slow processing, Difficulty sequencing, Requires verbal cues, Requires tactile cues General Comments: cues to keep eyes open, follows direction with increased time for processing, difficutly sequencing multipstep directions        Exercises General Exercises - Upper Extremity Elbow Flexion: 10 reps Elbow Extension: 10 reps Digit Composite Flexion: 10 reps General Exercises - Lower Extremity Ankle Circles/Pumps: 10 reps Long Arc Quad: 10 reps Heel Slides: 10 reps    General Comments        Pertinent Vitals/Pain Pain Assessment Pain Assessment: No/denies pain (pt reports no but pain but states her arms are sore)    Home Living                          Prior Function            PT  Goals (current goals can now be found in the care plan section) Acute Rehab PT Goals Patient Stated Goal: Return to PLOF PT Goal Formulation: With patient/family Time For Goal Achievement: 02/17/22 Potential to Achieve Goals: Good Progress towards PT goals: Progressing toward goals    Frequency    Min 5X/week      PT Plan Current plan remains appropriate    Co-evaluation              AM-PAC PT "6 Clicks" Mobility   Outcome Measure  Help needed turning from your back to your side while in a flat bed without using bedrails?: A Lot Help needed moving from lying on your back to sitting on the side of a flat bed without using bedrails?: A Lot Help needed moving to and from a bed to a chair (including a wheelchair)?: A Lot Help needed standing up from a chair using your arms (e.g., wheelchair or bedside chair)?: A Lot Help needed to walk in hospital room?: A Lot Help needed climbing 3-5 steps with a railing? : Total 6 Click Score: 11    End of Session Equipment Utilized During Treatment: Gait belt;Cervical collar Activity Tolerance: Patient limited by fatigue Patient left: in bed;with call bell/phone within reach;with family/visitor present Nurse Communication: Mobility status PT Visit Diagnosis: Unsteadiness on feet (R26.81);Other symptoms and signs involving the nervous system (R29.898)  Time: 1418-1500 PT Time Calculation (min) (ACUTE ONLY): 42 min  Charges:  $Gait Training: 23-37 mins $Therapeutic Exercise: 8-22 mins                     Mackie Pai, SPT Acute Rehabilitation Services  Office: [place] 02/04/2022, 3:41 PM

## 2022-02-04 NOTE — Progress Notes (Signed)
Subjective: Patient reports tyhat she feels as though she is continuing to improve in regards to her preoperative symptoms.  She continues to have appropriate surgical pain with mild discomfort in her bilateral upper extremities.  Heart sore throat persists and is making it difficult for to her to eat in conjunction with a decreased appetite overall.   Objective: Vital signs in last 24 hours: Temp:  [97.8 F (36.6 C)-98.7 F (37.1 C)] 98.7 F (37.1 C) (05/31 0751) Pulse Rate:  [100-102] 102 (05/31 0751) Resp:  [16-18] 18 (05/31 0751) BP: (121-147)/(58-67) 121/58 (05/31 0751) SpO2:  [90 %] 90 % (05/31 0751)  Intake/Output from previous day: 05/30 0701 - 05/31 0700 In: 565.1 [P.O.:480; IV Piggyback:85.1] Out: 600 [Urine:600] Intake/Output this shift: No intake/output data recorded.  Physical Exam: Patient was sleeping on my arrival but was able to arouse easily to voice. She is O X 4. Speech is fluent and appropriate. MAEW. BUE 4-/5 except for grip strength 3/5 on the left and 4-/5 on the right. BLE 4+/5 throughout. Sensation to light touch is intact. PERLA, EOMI. CNs grossly intact. Dressing is clean dry intact. Incision is well approximated with no drainage, erythema, or edema. Hemovac with approximately 10 ml of sanguineous  drainage. Hard cervical collar in place  Lab Results: No results for input(s): WBC, HGB, HCT, PLT in the last 72 hours. BMET Recent Labs    02/04/22 0645  NA 134*  K 3.7  CL 101  CO2 23  GLUCOSE 135*  BUN 11  CREATININE 0.77  CALCIUM 8.5*    Studies/Results: No results found.  Assessment/Plan: Patient is post-op day 2 s/p C4 corpectomy with C3-5 anterior fusion due to C4 fracture with spinal cord injury. She is continuing to recover well and is having a slow improvement of her preoperative symptoms.  She has appropriate incisional discomfort as well as shoulder and upper back discomfort.  PT/OT are recommending home health PT as the patient's daughter  has declined inpatient rehab.  The patient's daughter would like to take her home and care for her there.  The patient's cervical Hemovac will be removed today.  We will look at discharging her tomorrow. Continue hard cervical collar. Continue working on pain control, mobility and ambulating patient.     -Continue hard cervical collar -Pain control -PT/OT -Remove cervical Hemovac today    LOS: 4 days     Marvis Moeller, DNP, AGNP-C Neurosurgery Nurse Practitioner  [facility] [address], [place], [place] [address] P: [phone]    F: [phone]  02/04/2022 8:37 AM

## 2022-02-04 NOTE — Progress Notes (Signed)
OT Cancellation Note  Patient Details Name: Shelby Fleming MRN: [id number] DOB: [birth year]   Cancelled Treatment:    Reason Eval/Treat Not Completed: Other (comment) Per RN, pt pending bladder scan and pain premedication. Will follow up for OT session as schedule permits this AM.  Layla Maw 02/04/2022, 8:35 AM

## 2022-02-05 MED ORDER — GABAPENTIN 100 MG PO CAPS
100.0000 mg | ORAL_CAPSULE | Freq: Three times a day (TID) | ORAL | Status: DC
  Administered 2022-02-05 – 2022-02-08 (×11): 100 mg via ORAL
  Filled 2022-02-05 (×12): qty 1

## 2022-02-05 MED ORDER — POLYETHYLENE GLYCOL 3350 17 G PO PACK
17.0000 g | PACK | Freq: Every day | ORAL | Status: DC
  Administered 2022-02-05 – 2022-02-06 (×2): 17 g via ORAL
  Filled 2022-02-05 (×2): qty 1

## 2022-02-05 MED ORDER — BISACODYL 10 MG RE SUPP: 10.0000 mg | Freq: Every day | RECTAL | Status: DC | PRN

## 2022-02-05 MED ORDER — CHLORHEXIDINE GLUCONATE CLOTH 2 % EX PADS
6.0000 | MEDICATED_PAD | Freq: Every day | CUTANEOUS | Status: DC
Start: 2022-02-05 — End: 2022-02-11
  Administered 2022-02-05 – 2022-02-09 (×5): 6 via TOPICAL

## 2022-02-05 NOTE — TOC Initial Note (Signed)
Transition of Care [facility]) - Initial/Assessment Note    Patient Details  Name: Shelby Fleming MRN: [id number] Date of Birth: [birth year]  Transition of Care [facility] CM/SW Contact:    Sharin Mons, RN Phone Number: 02/05/2022, 3:37 PM  Clinical Narrative:             S/p C4 corpectomy and C3-5 anterior fusion 5/29, hx of ACDF, HTN, psoriasis.    From home with daughter and son in law. States PTA independent with ADL's, no DME usage. [facility] spoke with pt regarding d/c planning. PT recommends Acute inpatient rehab (3hours/day). Pt agreeable. Pt screened for CIR. Rehab consult pending.  TOC team will continue to monitor and assist with needs ...  Expected Discharge Plan: IP Rehab Facility Barriers to Discharge: Continued Medical Work up   Patient Goals and CMS Choice        Expected Discharge Plan and Services Expected Discharge Plan: [place]   Discharge Planning Services: CM Consult   Living arrangements for the past 2 months: Single Family Home                                      Prior Living Arrangements/Services Living arrangements for the past 2 months: Single Family Home Lives with:: Adult Children Patient language and need for interpreter reviewed:: Yes Do you feel safe going back to the place where you live?: Yes      Need for Family Participation in Patient Care: Yes (Comment) Care giver support system in place?: Yes (comment)   Criminal Activity/Legal Involvement Pertinent to Current Situation/Hospitalization: No - Comment as needed  Activities of Daily Living Home Assistive Devices/Equipment: None ADL Screening (condition at time of admission) Patient's cognitive ability adequate to safely complete daily activities?: Yes Is the patient deaf or have difficulty hearing?: No Does the patient have difficulty seeing, even when wearing glasses/contacts?: No Does the patient have difficulty concentrating, remembering, or making decisions?: No Patient  able to express need for assistance with ADLs?: Yes Does the patient have difficulty dressing or bathing?: No Independently performs ADLs?: Yes (appropriate for developmental age) Does the patient have difficulty walking or climbing stairs?: No Weakness of Legs: None Weakness of Arms/Hands: None  Permission Sought/Granted   Permission granted to share information with : Yes, Verbal Permission Granted  Share Information with NAME: [name] [phone]           Emotional Assessment Appearance:: Appears stated age Attitude/Demeanor/Rapport: Gracious Affect (typically observed): Accepting Orientation: : Oriented to Self, Oriented to Place, Oriented to  Time, Oriented to Situation Alcohol / Substance Use: Not Applicable Psych Involvement: No (comment)  Admission diagnosis:  C4 cervical fracture ([place]) [S12.300A] Closed displaced fracture of fourth cervical vertebra, unspecified fracture morphology, initial encounter ([place]) [S12.300A] Closed cervical spine fracture ([place]) [S12.9XXA] Patient Active Problem List   Diagnosis Date Noted   C4 cervical fracture ([place]) 01/31/2022   Closed cervical spine fracture ([place]) 01/31/2022   PCP:  London Pepper, MD Pharmacy:   [facility] [id number] - [address], [address] [address] Phone: [phone] Fax: [phone]     Social Determinants of Health (SDOH) Interventions    Readmission Risk Interventions     View : No data to display.

## 2022-02-05 NOTE — Progress Notes (Signed)
Occupational Therapy Treatment Patient Details Name: Shelby Fleming MRN: [id number] DOB: 11/06/1945 Today's Date: 02/05/2022   History of present illness Pt is a 76 y/o female who admitted after a mechanical fall at home with altered sensation of BUE. 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 Pt underwent C4 corpectomy and C3-5 anterior fusion with removal of previous plate on 3/22. PMH: prior ACDF, HTN, psoriasis.   OT comments  Pt making incremental progress towards OT goals, remains a high fall risk due to coordination/balance deficits. Emphasis on daughter using gait belt/providing assist with transfers (improved with pt wearing tennis shoes), grooming/self feeding abilities and when to use built up foam grips, and cervical collar mgmt strategies. Pt able to improve transfers to Min A x 2 for safety, continues to require Min A for basic UB ADLs and extensive assist for LB ADLs. Today, pt's daughter reports she has concerns about being able to safely mobilize pt at home and now interested in AIR prior to DC as previously recommended by therapies. Updated DC recs to reflect.   Recommendations for follow up therapy are one component of a multi-disciplinary discharge planning process, led by the attending physician.  Recommendations may be updated based on patient status, additional functional criteria and insurance authorization.    Follow Up Recommendations  Acute inpatient rehab (3hours/day) (pt's daughter now interested in AIR)    Assistance Recommended at Discharge Frequent or constant Supervision/Assistance  Patient can return home with the following  A lot of help with walking and/or transfers;A lot of help with bathing/dressing/bathroom;Assistance with cooking/housework;Assist for transportation;Help with stairs or ramp for entrance   Equipment Recommendations  BSC/3in1;Other (comment) (RW)     Recommendations for Other Services Rehab consult    Precautions / Restrictions Precautions Precautions: Fall;Cervical Precaution Booklet Issued: Yes (comment) Required Braces or Orthoses: Cervical Brace Cervical Brace: Hard collar;At all times;Other (comment) Restrictions Weight Bearing Restrictions: No       Mobility Bed Mobility Overal bed mobility: Needs Assistance Bed Mobility: Supine to Sit     Supine to sit: Mod assist     General bed mobility comments: able to walk LEs off of side of bed while Mod A given to sit straight up in bed with trunk support    Transfers Overall transfer level: Needs assistance Equipment used: Rolling walker (2 wheels) Transfers: Sit to/from Stand, Bed to chair/wheelchair/BSC Sit to Stand: Min assist     Step pivot transfers: Min assist, +2 safety/equipment     General transfer comment: Pt able to stand with Min A (daughter assisted with gait belt and OT with close hands on assist for safety though daughter providing the assist). Able to step to recliner and turn with improving RW mgmt. Less coordination issues noted with tennis shoes donned     Balance Overall balance assessment: Needs assistance Sitting-balance support: Bilateral upper extremity supported, Feet supported Sitting balance-Leahy Scale: Good     Standing balance support: Bilateral upper extremity supported, During functional activity, Reliant on assistive device for balance Standing balance-Leahy Scale: Poor                             ADL either performed or assessed with clinical judgement   ADL Overall ADL's : Needs assistance/impaired     Grooming: Minimal assistance;Sitting;Oral care;Wash/dry face Grooming Details (indicate cue type and reason): able to hold  regular toothbrush though did drop once during task due to sensation/coordination deficits. unable to open toothpaste without assist and place on toothbrush. daughter jumping in to assist with  rinse/spit cups             Lower Body Dressing: Maximal assistance;Sit to/from stand Lower Body Dressing Details (indicate cue type and reason): to don shoes             Functional mobility during ADLs: Moderate assistance;Rolling walker (2 wheels) General ADL Comments: Continued emphasis on gait belt use, coordination with smaller ADL items and when to use foam grips. educated on collar mgmt, removing pads for washing and placing paper towels/washcloths to minimize soilage on collar    Extremity/Trunk Assessment Upper Extremity Assessment Upper Extremity Assessment: RUE deficits/detail RUE Deficits / Details: reports pins and needles sensation has resolved and numbness improving. fair grasp though difficulty holding/manipulating small items RUE Sensation: decreased light touch RUE Coordination: decreased fine motor LUE Deficits / Details: increased difficulty with thumb to finger movement. fari grasp LUE Sensation: decreased light touch LUE Coordination: decreased fine motor   Lower Extremity Assessment Lower Extremity Assessment: Defer to PT evaluation        Vision   Vision Assessment?: No apparent visual deficits   Perception     Praxis      Cognition Arousal/Alertness: Awake/alert Behavior During Therapy: WFL for tasks assessed/performed, Flat affect Overall Cognitive Status: Impaired/Different from baseline Area of Impairment: Problem solving, Following commands                       Following Commands: Follows one step commands with increased time     Problem Solving: Slow processing, Difficulty sequencing, Requires verbal cues, Requires tactile cues General Comments: cues to keep eyes open, follows direction with increased time for processing, cues for problem solving and attempting tasks        Exercises      Shoulder Instructions       General Comments Daughter present, interested in AIR now that she has some concerns over gait belt  use and providing assist at home    Pertinent Vitals/ Pain       Pain Assessment Pain Assessment: Faces Faces Pain Scale: Hurts a little bit Pain Location: throat, back Pain Descriptors / Indicators: Sore Pain Intervention(s): Monitored during session, Limited activity within patient's tolerance, Premedicated before session  Home Living                                          Prior Functioning/Environment              Frequency  Min 3X/week        Progress Toward Goals  OT Goals(current goals can now be found in the care plan section)  Progress towards OT goals: Progressing toward goals  Acute Rehab OT Goals Patient Stated Goal: daughter would like to get rehab approval OT Goal Formulation: With patient/family Time For Goal Achievement: 02/17/22 Potential to Achieve Goals: Good ADL Goals Pt Will Perform Grooming: with set-up;standing Pt Will Perform Lower Body Bathing: with set-up;sit to/from stand;sitting/lateral leans Pt Will Transfer to Toilet: with supervision;ambulating  Plan Discharge plan needs to be updated    Co-evaluation                 AM-PAC OT "6 Clicks" Daily Activity     Outcome  Measure   Help from another person eating meals?: A Little Help from another person taking care of personal grooming?: A Little Help from another person toileting, which includes using toliet, bedpan, or urinal?: A Lot Help from another person bathing (including washing, rinsing, drying)?: A Lot Help from another person to put on and taking off regular upper body clothing?: A Little Help from another person to put on and taking off regular lower body clothing?: A Lot 6 Click Score: 15    End of Session Equipment Utilized During Treatment: Gait belt;Rolling walker (2 wheels);Cervical collar  OT Visit Diagnosis: Unsteadiness on feet (R26.81);Other abnormalities of gait and mobility (R26.89);Muscle weakness (generalized) (M62.81)   Activity  Tolerance Patient tolerated treatment well   Patient Left in chair;with call bell/phone within reach;with chair alarm set   Nurse Communication Mobility status        Time: 1132-1208 OT Time Calculation (min): 36 min  Charges: OT General Charges $OT Visit: 1 Visit OT Treatments $Self Care/Home Management : 23-37 mins  Malachy Chamber, OTR/L Acute Rehab Services Office: [phone]   Layla Maw 02/05/2022, 12:42 PM

## 2022-02-05 NOTE — Progress Notes (Signed)
° °  Inpatient Rehab Admissions Coordinator : ° °Per therapy change in recommendations, patient was screened for CIR candidacy by Siara Gorder RN MSN.  At this time patient appears to be a potential candidate for CIR. I will place a rehab consult per protocol for full assessment. Please call me with any questions. ° °Alexia Dinger RN MSN °Admissions Coordinator °[phone] °  °

## 2022-02-05 NOTE — Progress Notes (Signed)
Physical Therapy Treatment Patient Details Name: Shelby Fleming MRN: [id number] DOB: Jan 18, 1946 Today's Date: 02/05/2022   History of Present Illness Pt is a 76 y/o female who admitted after a mechanical fall at home with altered sensation of BUE. 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 Pt underwent C4 corpectomy and C3-5 anterior fusion with removal of previous plate on 9/73. PMH: prior ACDF, HTN, psoriasis.    PT Comments    Discussion with pt and daughter reviewing previously discussed rehab options at d/c. Pt and daughter report they would like to pursue AIR to maximize functional recovery, decrease risk for falls, and return to PLOF. Pt motivated to work with PT despite just getting settled back in bed from transfer from the chair. Continue to feel AIR remains the most appropriate d/c disposition. Will continue to follow and progress as able per POC.     Recommendations for follow up therapy are one component of a multi-disciplinary discharge planning process, led by the attending physician.  Recommendations may be updated based on patient status, additional functional criteria and insurance authorization.  Follow Up Recommendations  Acute inpatient rehab (3hours/day)     Assistance Recommended at Discharge Frequent or constant Supervision/Assistance  Patient can return home with the following A lot of help with walking and/or transfers;A lot of help with bathing/dressing/bathroom;Assistance with feeding;Assist for transportation;Help with stairs or ramp for entrance   Equipment Recommendations  Rolling walker (2 wheels);BSC/3in1    Recommendations for Other Services Rehab consult     Precautions / Restrictions Precautions Precautions: Fall;Cervical Precaution Booklet Issued: Yes (comment) Precaution Comments: Reviewed handout and pt was cued for precautions during functional  mobility. Required Braces or Orthoses: Cervical Brace Cervical Brace: Hard collar;At all times;Other (comment) Restrictions Weight Bearing Restrictions: No     Mobility  Bed Mobility Overal bed mobility: Needs Assistance Bed Mobility: Supine to Sit, Sit to Supine Rolling: Mod assist Sidelying to sit: Mod assist, +2 for physical assistance, HOB elevated     Sit to sidelying: Mod assist, +2 for physical assistance General bed mobility comments: VC's for optimal log roll technique. HOB slightly elevated for comfort.    Transfers Overall transfer level: Needs assistance Equipment used: Rolling walker (2 wheels) Transfers: Sit to/from Stand, Bed to chair/wheelchair/BSC Sit to Stand: Min guard, +2 safety/equipment           General transfer comment: Hands on guarding for power up to full stand, however no physical assist required. Pt was able to transition with good hip/knee extension.    Ambulation/Gait Ambulation/Gait assistance: Mod assist, +2 safety/equipment Gait Distance (Feet): 100 Feet Assistive device: Rolling walker (2 wheels) Gait Pattern/deviations: Knees buckling, Shuffle, Ataxic, Step-through pattern, Knee flexed in stance - right, Knee flexed in stance - left Gait velocity: variable Gait velocity interpretation: 1.31 - 2.62 ft/sec, indicative of limited community ambulator   General Gait Details: VC's for improved posture, increased heel strike, and closer walker proximity. Pt was able to ambulate well with decreased staggering/lateral LOB.   Stairs             Wheelchair Mobility    Modified Rankin (Stroke Patients Only)       Balance Overall balance assessment: Needs assistance Sitting-balance support: Bilateral upper extremity supported, Feet supported Sitting balance-Leahy Scale: Good   Postural control: Posterior lean Standing balance support: Bilateral upper extremity supported, During functional activity, Reliant on assistive device for  balance Standing balance-Leahy Scale: Poor                              Cognition Arousal/Alertness: Awake/alert Behavior During Therapy: Flat affect Overall Cognitive Status: Impaired/Different from baseline Area of Impairment: Problem solving, Following commands, Attention, Safety/judgement, Awareness                   Current Attention Level: Selective   Following Commands: Follows one step commands with increased time Safety/Judgement: Decreased awareness of safety, Decreased awareness of deficits Awareness: Emergent Problem Solving: Slow processing, Difficulty sequencing, Requires verbal cues, Requires tactile cues General Comments: Pt avoiding eye contact with focus on TV. Not verbally responding at times but will respond when name is said and question is repeated.        Exercises      General Comments General comments (skin integrity, edema, etc.): Daughter present, interested in AIR now that she has some concerns over gait belt use and providing assist at home      Pertinent Vitals/Pain Pain Assessment Pain Assessment: Faces Faces Pain Scale: Hurts a little bit Breathing: normal Negative Vocalization: none Facial Expression: smiling or inexpressive Body Language: relaxed Consolability: no need to console PAINAD Score: 0 Pain Location: throat, neck Pain Descriptors / Indicators: Sore, Operative site guarding Pain Intervention(s): Limited activity within patient's tolerance, Monitored during session, Repositioned    Home Living                          Prior Function            PT Goals (current goals can now be found in the care plan section) Acute Rehab PT Goals Patient Stated Goal: Return to PLOF PT Goal Formulation: With patient/family Time For Goal Achievement: 02/17/22 Potential to Achieve Goals: Good Progress towards PT goals: Progressing toward goals    Frequency    Min 5X/week      PT Plan Discharge plan  needs to be updated    Co-evaluation              AM-PAC PT "6 Clicks" Mobility   Outcome Measure  Help needed turning from your back to your side while in a flat bed without using bedrails?: A Lot Help needed moving from lying on your back to sitting on the side of a flat bed without using bedrails?: A Lot Help needed moving to and from a bed to a chair (including a wheelchair)?: A Lot Help needed standing up from a chair using your arms (e.g., wheelchair or bedside chair)?: A Lot Help needed to walk in hospital room?: A Lot Help needed climbing 3-5 steps with a railing? : Total 6 Click Score: 11    End of Session Equipment Utilized During Treatment: Gait belt;Cervical collar Activity Tolerance: Patient limited by fatigue Patient left: in bed;with call bell/phone within reach;with family/visitor present Nurse Communication: Mobility status PT Visit Diagnosis: Unsteadiness on feet (R26.81);Other symptoms and signs involving the nervous system (R29.898)     Time: 1610-9604 PT Time Calculation (min) (ACUTE ONLY): 31 min  Charges:  $Gait Training: 23-37 mins                     Rolinda Roan, PT, DPT Acute Rehabilitation Services Secure Chat Preferred Office: [phone]    Shelby Fleming 02/05/2022, 2:57 PM

## 2022-02-05 NOTE — Progress Notes (Signed)
Subjective: Patient reports that her dysphagia and sore throat persists, although is slowly improving. She has appropriate surgical pain. No acute events overnight. She has been unable to void spontaneously and has had no bowel movement since surgery.    Objective: Vital signs in last 24 hours: Temp:  [98.5 F (36.9 C)-99.8 F (37.7 C)] 98.5 F (36.9 C) (05/31 2015) Pulse Rate:  [95-97] 95 (05/31 2015) Resp:  [13-18] 13 (05/31 2015) BP: (116-124)/(51-63) 124/63 (05/31 2015) SpO2:  [98 %] 98 % (05/31 2015)  Intake/Output from previous day: 05/31 0701 - 06/01 0700 In: -  Out: 460 [Urine:460] Intake/Output this shift: No intake/output data recorded.  Physical Exam: Patient was sleeping on my arrival but was able to arouse easily to voice. She is O X 4. Speech is fluent and appropriate. MAEW. BUE 4-/5 except for grip strength 3/5 on the left and 4-/5 on the right. BLE 4+/5 throughout. Sensation to light touch is intact. PERLA, EOMI. CNs grossly intact. Dressing is clean dry intact. Incision is well approximated with no drainage, erythema, or edema. Hard cervical collar in place. Abdomen sift without tenderness to palpation. Bowel sounds active. Passing flatus.    Lab Results: No results for input(s): WBC, HGB, HCT, PLT in the last 72 hours. BMET Recent Labs    02/04/22 0645  NA 134*  K 3.7  CL 101  CO2 23  GLUCOSE 135*  BUN 11  CREATININE 0.77  CALCIUM 8.5*    Studies/Results: No results found.  Assessment/Plan:  Patient is post-op day 3 s/p C4 corpectomy with C3-5 anterior fusion due to C4 fracture with spinal cord injury. She is continuing to recover well and is continuing to note some improvement of her preoperative symptoms.  She has appropriate incisional discomfort as well as shoulder and upper back discomfort. The patient and daughter are now amenable to CIR and I feel that this would be appropriate due to he myelopathy. Continue hard cervical collar. Continue working  on pain control, mobility and ambulating patient.        -Continue hard cervical collar -Pain control -PT/OT -Encourage mobilization -Limit narcotic pain medication as able -Constipation. Scheduled colace, Mirilax. Utilize PRN suppositories and enemas PRN -Urinary retention, will proceed with intermittent bladder scan with 1 more I/O cath. If continues to have retention, will place foley.   LOS: 5 days     Marvis Moeller, DNP, AGNP-C Neurosurgery Nurse Practitioner  [facility] [place] [address], [place], [place] [address] P: [phone]    F: [phone]  02/05/2022 8:12 AM

## 2022-02-06 MED ORDER — BISACODYL 10 MG RE SUPP
10.0000 mg | Freq: Once | RECTAL | Status: AC
  Administered 2022-02-06: 10 mg via RECTAL
  Filled 2022-02-06: qty 1

## 2022-02-06 NOTE — Progress Notes (Signed)
Mobility Specialist Progress Note   02/06/22 1215  Mobility  Activity Ambulated with assistance in hallway  Level of Assistance Contact guard assist, steadying assist  Assistive Device Front wheel walker  Distance Ambulated (ft) 195 ft  Activity Response Tolerated well  $Mobility charge 1 Mobility    Received in bed c/o general pain(4/10) in both hands accompanied by weakness in UE but agreeable to mobility. Requiring modA to EOB to maintain cervical precautions and cater to sensitivity to hands. No physical assist needed from an elevated surface w/ contact guard during ambulation. Also, pt required min VC on their pace for energy conservation. +2 A for chair follow and to progress gait, x1 seated break d/t fatigue. Rolled back to the room and left in the chair stating that hands are feeling better. Call bell placed in reach and all needs were met.    Holland Falling Mobility Specialist Phone Number [phone]

## 2022-02-06 NOTE — Progress Notes (Signed)
Inpatient Rehab Admissions Coordinator:   Met with pt and her family at bedside to discuss CIR recommendations and goals/expectations of CIR stay.  Reviewed 3 hrs/day of therapy, physician follow up, and average length of stay 2 weeks (depending on progress), with goals of supervision to mod I at discharge.  Per pt's daughter, she is planning to stay with pt at discharge and can provide 24/7 supervision/min assist. We reviewed insurance auth process and I will start request today.  Will follow for potential admit pending insurance approval and bed availability.   Shann Medal, PT, DPT Admissions Coordinator [phone] 02/06/22  11:49 AM

## 2022-02-06 NOTE — Progress Notes (Signed)
Subjective: Patient reports that her swallowing and throat soreness persists, but is much improved from yesterday. A foley catheter was placed yesterday due to urinary retention. Her pain is appropriate and mild. Her BUE strength continues to slowly improve.     Vital signs in last 24 hours: Temp:  [97.6 F (36.4 C)-98.5 F (36.9 C)] 97.6 F (36.4 C) (06/02 0435) Pulse Rate:  [85-90] 85 (06/02 0435) Resp:  [15-18] 17 (06/02 0435) BP: (147-168)/(67-72) 168/67 (06/02 0435) SpO2:  [93 %-97 %] 94 % (06/02 0435)  Intake/Output from previous day: 06/01 0701 - 06/02 0700 In: 110 [P.O.:110] Out: 1350 [Urine:1350] Intake/Output this shift: No intake/output data recorded.  Physical Exam: Patient was sleeping on my arrival but was able to arouse easily to voice. She is O X 4. Speech is fluent and appropriate. MAEW. BUE 4-/5 except for grip strength 3/5 on the left and 4-/5 on the right. BLE 4+/5 throughout. Sensation to light touch is intact. PERLA, EOMI. CNs grossly intact. Dressing is clean dry intact. Incision is well approximated with no drainage, erythema, or edema. Hard cervical collar in place. Abdomen is soft without tenderness to palpation or distention. Bowel sounds active. Passing flatus.   Lab Results: No results for input(s): WBC, HGB, HCT, PLT in the last 72 hours. BMET Recent Labs    02/04/22 0645  NA 134*  K 3.7  CL 101  CO2 23  GLUCOSE 135*  BUN 11  CREATININE 0.77  CALCIUM 8.5*    Studies/Results: No results found.  Assessment/Plan: Patient is post-op day 4 s/p C4 corpectomy with C3-5 anterior fusion due to C4 fracture with spinal cord injury. She is continuing to note slow improvement of her preoperative symptoms. She reports being able to brush her teeth yesterday. She has appropriate incisional discomfort as well as shoulder and upper back discomfort. PT/OT reevaluation now recommending CIR for later convalescence. Her dysphagia has significantly improved. She has  not had a bowel movement since surgery. Urinary retention persists. Continue hard cervical collar. Continue working on pain control, mobility and ambulating patient.       -Continue hard cervical collar -Pain control -PT/OT -Encourage mobilization -Limit narcotic pain medication as able -Constipation. Scheduled Colace, Mirilax. Utilize PRN suppositories and enemas PRN -Urinary retention, Foley catheter placed yesterday  LOS: 6 days     Marvis Moeller, DNP, AGNP-C Neurosurgery Nurse Practitioner  [facility] [place] [address], [address], [place], [place] [address] P: [phone]    F: [phone]  02/06/2022 7:51 AM

## 2022-02-06 NOTE — Progress Notes (Signed)
Physical Therapy Treatment Patient Details Name: Shelby Fleming MRN: [id number] DOB: 01/01/1946 Today's Date: 02/06/2022   History of Present Illness Pt is a 76 y/o female who admitted after a mechanical fall at home with altered sensation of BUE. 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 Pt underwent C4 corpectomy and C3-5 anterior fusion with removal of previous plate on 7/90. PMH: prior ACDF, HTN, psoriasis.    PT Comments    Pt demonstrated increased motor control with ambulation this session but still requiring manual assist and cueing. Pt continues to be motivated to engage with therapy and demonstrated fair recall of education from prior sessions. Pt able to stand at sink for 3 minutes to perform ADL task to brush teeth with no overt LOB but reliance on UE support on sink. Increased overall activity tolerance but remains a fall risk due to balance deficits and decreased motor control. AIR is still the most appropriate discharge disposition due to pts functional deficits and level of assistance required.    Recommendations for follow up therapy are one component of a multi-disciplinary discharge planning process, led by the attending physician.  Recommendations may be updated based on patient status, additional functional criteria and insurance authorization.  Follow Up Recommendations  Acute inpatient rehab (3hours/day)     Assistance Recommended at Discharge Frequent or constant Supervision/Assistance  Patient can return home with the following A lot of help with walking and/or transfers;A lot of help with bathing/dressing/bathroom;Assistance with feeding;Assist for transportation;Help with stairs or ramp for entrance   Equipment Recommendations  Rolling walker (2 wheels);BSC/3in1    Recommendations for Other Services       Precautions / Restrictions Precautions Precautions:  Fall;Cervical Precaution Booklet Issued: Yes (comment) Precaution Comments: Reviewed handout and pt was cued for precautions during functional mobility. Required Braces or Orthoses: Cervical Brace Cervical Brace: Hard collar;At all times;Other (comment) Restrictions Weight Bearing Restrictions: No     Mobility  Bed Mobility               General bed mobility comments: Pt in recliner upon arrival    Transfers Overall transfer level: Needs assistance Equipment used: Rolling walker (2 wheels) Transfers: Sit to/from Stand, Bed to chair/wheelchair/BSC Sit to Stand: Min guard   Step pivot transfers: Min assist, From elevated surface       General transfer comment: guarding for safety for STS from recliner but no assist required, HHA for step pivot to bed side commode for balance    Ambulation/Gait Ambulation/Gait assistance: Min assist, +2 safety/equipment Gait Distance (Feet): 200 Feet Assistive device: Rolling walker (2 wheels) Gait Pattern/deviations: Step-through pattern, Decreased stride length, Shuffle, Knee flexed in stance - right, Knee flexed in stance - left Gait velocity: variable Gait velocity interpretation: 1.31 - 2.62 ft/sec, indicative of limited community ambulator   General Gait Details: VCs for heel strike and longer steps as well as keeping RW close. Pt demonstrated increased motor control. Cued pt to stop when shuffling increased. Pt able to correct and continue.   Stairs             Wheelchair Mobility    Modified Rankin (Stroke Patients Only)       Balance Overall balance assessment: Needs assistance Sitting-balance support: Bilateral upper extremity supported, Feet supported Sitting balance-Leahy Scale: Good Sitting balance - Comments: posterior lean with fatigue Postural control: Posterior lean Standing balance support: Bilateral upper extremity supported,  During functional activity, Reliant on assistive device for balance Standing  balance-Leahy Scale: Fair Standing balance comment: reliance on RW and sink when in bathroom for self care. No noted LOB but requiring hands on assist for safety                            Cognition Arousal/Alertness: Awake/alert Behavior During Therapy: WFL for tasks assessed/performed Overall Cognitive Status: Impaired/Different from baseline Area of Impairment: Problem solving, Following commands, Attention, Safety/judgement, Awareness                   Current Attention Level: Selective   Following Commands: Follows one step commands with increased time Safety/Judgement: Decreased awareness of safety, Decreased awareness of deficits Awareness: Emergent Problem Solving: Slow processing, Difficulty sequencing, Requires verbal cues, Requires tactile cues General Comments: increased time for processing commands and requires repeated instruction        Exercises      General Comments        Pertinent Vitals/Pain Pain Assessment Pain Assessment: Faces Faces Pain Scale: Hurts a little bit Pain Location: hands Pain Descriptors / Indicators: Sore Pain Intervention(s): Monitored during session    Home Living                          Prior Function            PT Goals (current goals can now be found in the care plan section) Acute Rehab PT Goals Patient Stated Goal: Return to PLOF PT Goal Formulation: With patient/family Time For Goal Achievement: 02/17/22 Potential to Achieve Goals: Good Progress towards PT goals: Progressing toward goals    Frequency    Min 5X/week      PT Plan Current plan remains appropriate    Co-evaluation              AM-PAC PT "6 Clicks" Mobility   Outcome Measure  Help needed turning from your back to your side while in a flat bed without using bedrails?: A Lot Help needed moving from lying on your back to sitting on the side of a flat bed without using bedrails?: A Lot Help needed moving to and  from a bed to a chair (including a wheelchair)?: A Little Help needed standing up from a chair using your arms (e.g., wheelchair or bedside chair)?: A Little Help needed to walk in hospital room?: A Lot Help needed climbing 3-5 steps with a railing? : A Lot 6 Click Score: 14    End of Session Equipment Utilized During Treatment: Gait belt;Cervical collar Activity Tolerance: Patient tolerated treatment well Patient left: with call bell/phone within reach;with family/visitor present;Other (comment) (on [facility]) Nurse Communication: Mobility status PT Visit Diagnosis: Unsteadiness on feet (R26.81);Other symptoms and signs involving the nervous system (R29.898)     Time: 2725-3664 PT Time Calculation (min) (ACUTE ONLY): 32 min  Charges:  $Gait Training: 23-37 mins                     Mackie Pai, SPT Acute Rehabilitation Services  Office: [place] 02/06/2022, 2:21 PM

## 2022-02-06 NOTE — PMR Pre-admission (Signed)
PMR Admission Coordinator Pre-Admission Assessment  Patient: Shelby Fleming is an 76 y.o., female MRN: [id number] DOB: 06-03-1946 Height: 5' 4"  (162.6 cm) Weight: 89.1 kg  Insurance Information HMO: yes    PPO:      PCP:      IPA:      80/20:      OTHER:  PRIMARY: [employer] [facility]      Policy#: [id number]      Subscriber: patient CM Name: Verdie Drown      Phone#: [phone]     Fax#: [phone] Pre-Cert#: [id number] [place] for CIR from [facility] [place] with updates due to fax listed above on 6/17      Employer:  Benefits:  Phone #: [phone]     Name:  Eff. Date: 09/07/2021     Deduct: $0      Out of Pocket Max: $3,600 ( $100 met)      Life Max:  CIR: $295/day days 1-5, $0/day for days 6+      SNF: $0 per day days 1-20 Outpatient: $20/visit     Co-Pay: $20 Home Health: 100%      Co-Pay:  DME: 80%     Co-Pay: 20%  SECONDARY:       Policy#:      Phone#:   Development worker, community:       Phone#:   The Therapist, art Information Summary" for patients in Inpatient Rehabilitation Facilities with attached "Privacy Act [place] Records" was provided and verbally reviewed with: Patient and Family  Emergency Contact Information Contact Information     Name Relation Home Work Mobile   Robertshaw-Stroud,Cyrstal Daughter [phone]         Current Medical History  Patient Admitting Diagnosis: C4 central cord syndrome History of Present Illness: Shelby Fleming is a 76 year old right-handed female with history of hypertension, psoriasis as well as prior history ACDF. Presented 01/31/2022 after mechanical fall.  By report, patient tripped and fell into her storm door on her left side with severe dysesthesias to all 4 extremities.  No loss of consciousness.  Denied any syncope associated with the fall.  Cranial CT scan negative.  Bilateral hip and pelvis films negative.  CT cervical spine showed fracture through the C4 vertebral body in the inferior anterior corner at C3.  Central canal stenosis at C4-5  due to chronic calcified disc bulge.  MRI cervical spine again known C4 body and C3 anterior inferior corner fractures.  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  No visible injury to the posterior elements.  C4-5 cord flattening primarily from central disc protrusion.  Milder degenerative spinal stenosis C3-4.  By foraminal impingement at C3-4 and C4-5.  Left foraminal impingement of C7-T1 as well as C5-C7 ACDF.  Underwent C4 corpectomy, anterior interbody technique including discectomies and bilateral foraminotomies.  Placement of corpectomy cage at C4.  Placement of anterior instrumentation consisting of cervical plate and screws L8-9-2 02/02/2022 per Dr. Duffy Rhody.  She was placed in a cervical hard collar and was able to remove for showering.  She was cleared to begin Lovenox for DVT prophylaxis 02/03/2022.  Her diet has been advanced to a regular consistency.  She did have some urinary retention, suspect neurogenic bladder, initially placed on Flomax since discontinued with lattest bladder scan 451 ml and refused intermittent catheters patient considering Foley.  Therapy evaluations completed and pt was recommended for a comprehensive rehab program.      Patient's medical record from [facility] has been  reviewed by the rehabilitation admission coordinator and physician.  Past Medical History  Past Medical History:  Diagnosis Date   Hypertension    Psoriasis     Has the patient had major surgery during 100 days prior to admission? Yes  Family History   family history is not on file.  Current Medications  Current Facility-Administered Medications:    0.9 %  sodium chloride infusion, 250 mL, Intravenous, Continuous, Vallarie Mare, MD   acetaminophen (TYLENOL) tablet 650 mg, 650 mg, Oral, Q4H PRN, 650 mg at 02/06/22 0809 **OR** acetaminophen (TYLENOL) suppository 650 mg, 650 mg, Rectal, Q4H PRN, Vallarie Mare, MD   bisacodyl (DULCOLAX)  suppository 10 mg, 10 mg, Rectal, Daily PRN, Marvis Moeller, NP   Chlorhexidine Gluconate Cloth 2 % PADS 6 each, 6 each, Topical, Daily, Fenton Malling L, NP, 6 each at 02/09/22 1035   cyclobenzaprine (FLEXERIL) tablet 10 mg, 10 mg, Oral, TID PRN, Vallarie Mare, MD, 10 mg at 02/08/22 1559   diphenhydrAMINE (BENADRYL) capsule 25 mg, 25 mg, Oral, Q6H PRN, Vallarie Mare, MD, 25 mg at 02/03/22 0248   docusate sodium (COLACE) capsule 100 mg, 100 mg, Oral, BID, Vallarie Mare, MD, 100 mg at 02/06/22 0809   enoxaparin (LOVENOX) injection 40 mg, 40 mg, Subcutaneous, Q24H, Vallarie Mare, MD, 40 mg at 02/10/22 0800   gabapentin (NEURONTIN) capsule 100 mg, 100 mg, Oral, BID, Fenton Malling L, NP, 100 mg at 02/10/22 9242   HYDROmorphone (DILAUDID) injection 0.5 mg, 0.5 mg, Intravenous, Q2H PRN, Marvis Moeller, NP   lip balm (CARMEX) ointment, , Topical, PRN, Judith Part, MD   menthol-cetylpyridinium (CEPACOL) lozenge 3 mg, 1 lozenge, Oral, PRN **OR** phenol (CHLORASEPTIC) mouth spray 1 spray, 1 spray, Mouth/Throat, PRN, Vallarie Mare, MD   ondansetron [facility]) tablet 4 mg, 4 mg, Oral, Q6H PRN **OR** ondansetron (ZOFRAN) injection 4 mg, 4 mg, Intravenous, Q6H PRN, Vallarie Mare, MD   oxyCODONE (Oxy IR/ROXICODONE) immediate release tablet 10 mg, 10 mg, Oral, Q3H PRN, Vallarie Mare, MD   polyethylene glycol (MIRALAX / GLYCOLAX) packet 17 g, 17 g, Oral, Daily, Fenton Malling L, NP, 17 g at 02/06/22 0809   sodium chloride flush (NS) 0.9 % injection 3 mL, 3 mL, Intravenous, Q12H, Vallarie Mare, MD, 3 mL at 02/10/22 0924   sodium chloride flush (NS) 0.9 % injection 3 mL, 3 mL, Intravenous, PRN, Vallarie Mare, MD   sodium phosphate (FLEET) 7-19 GM/118ML enema 1 enema, 1 enema, Rectal, Once PRN, Vallarie Mare, MD   traMADol Veatrice Bourbon) tablet 50 mg, 50 mg, Oral, Q6H, Fenton Malling L, NP, 50 mg at 02/10/22 0800   traMADol (ULTRAM) tablet 50-100 mg,  50-100 mg, Oral, Q6H PRN, Marvis Moeller, NP  Patients Current Diet:  Diet Order             Diet regular Room service appropriate? Yes; Fluid consistency: Thin  Diet effective now                   Precautions / Restrictions Precautions Precautions: Fall, Cervical Precaution Booklet Issued: Yes (comment) Precaution Comments: Reviewed handout and pt was cued for precautions during functional mobility. Cervical Brace: Hard collar, At all times, Other (comment) Restrictions Weight Bearing Restrictions: No   Has the patient had 2 or more falls or a fall with injury in the past year? Yes  Prior Activity Level Community (5-7x/wk): Patient independent without AD, driving, independent with ADLs  Prior Functional Level Self Care: Did the patient need help bathing, dressing, using the toilet or eating? Independent  Indoor Mobility: Did the patient need assistance with walking from room to room (with or without device)? Independent  Stairs: Did the patient need assistance with internal or external stairs (with or without device)? Independent  Functional Cognition: Did the patient need help planning regular tasks such as shopping or remembering to take medications? Independent  Patient Information Are you of Hispanic, Latino/a,or Spanish origin?: A. No, not of Hispanic, Latino/a, or Spanish origin What is your race?: A. White Do you need or want an interpreter to communicate with a doctor or health care staff?: 0. No  Patient's Response To:  Health Literacy and Transportation Is the patient able to respond to health literacy and transportation needs?: Yes Health Literacy - How often do you need to have someone help you when you read instructions, pamphlets, or other written material from your doctor or pharmacy?: Never In the past 12 months, has lack of transportation kept you from medical appointments or from getting medications?: No In the past 12 months, has lack of  transportation kept you from meetings, work, or from getting things needed for daily living?: No  Development worker, international aid / [place] Devices/Equipment: None Home Equipment: None  Prior Device Use: Indicate devices/aids used by the patient prior to current illness, exacerbation or injury? None of the above  Current Functional Level Cognition  Overall Cognitive Status: Impaired/Different from baseline Current Attention Level: Selective Orientation Level: Oriented X4 Following Commands: Follows one step commands with increased time Safety/Judgement: Decreased awareness of safety, Decreased awareness of deficits General Comments: Daughter and son in law present and very supportive of pt, but unable to get much a sense for pt's cognition this visit due to family being very involved and often answering for pt.    Extremity Assessment (includes Sensation/Coordination)  Upper Extremity Assessment: RUE deficits/detail RUE Deficits / Details: Edema to hand. Limited shoulder AROM with upper trap compensation noted. See exercises. RUE Sensation: decreased light touch RUE Coordination: decreased fine motor LUE Deficits / Details: Edema to hand. AROM of shoulder to ~70 degrees in chair. LUE Sensation: decreased light touch LUE Coordination: decreased fine motor  Lower Extremity Assessment: Defer to PT evaluation RLE Sensation: WNL RLE Coordination: WNL LLE Sensation: WNL LLE Coordination: WNL    ADLs  Overall ADL's : Needs assistance/impaired Eating/Feeding: Set up, Sitting Grooming: Minimal assistance, Sitting, Oral care, Wash/dry face Grooming Details (indicate cue type and reason): able to hold regular toothbrush though did drop once during task due to sensation/coordination deficits. unable to open toothpaste without assist and place on toothbrush. daughter jumping in to assist with rinse/spit cups Upper Body Bathing: Moderate assistance, Sitting Lower Body Bathing: Maximal  assistance, Sit to/from stand Upper Body Dressing : Moderate assistance, Sitting Lower Body Dressing: Maximal assistance, Sit to/from stand Lower Body Dressing Details (indicate cue type and reason): to don shoes Toilet Transfer: Minimal assistance, Stand-pivot, Rolling walker (2 wheels) Toilet Transfer Details (indicate cue type and reason): Pt stood from recliner with cues for hand placement and Min As. Pt pivoted to EOB taking ~5 lateral steps with RW. Min As and cues for UE reach back to control descent with Min As Toileting- Water quality scientist and Hygiene: Maximal assistance, Sit to/from stand Toileting - Clothing Manipulation Details (indicate cue type and reason): assist for peri care in standing Functional mobility during ADLs: Minimal assistance, Rolling walker (2 wheels) General ADL Comments: Continued  emphasis on gait belt use, coordination with smaller ADL items and when to use foam grips. educated on collar mgmt, removing pads for washing and placing paper towels/washcloths to minimize soilage on collar    Mobility  Overal bed mobility: Needs Assistance Bed Mobility: Rolling, Sidelying to Sit Rolling: Mod assist Sidelying to sit: Min assist, HOB elevated Supine to sit: Mod assist Sit to supine: Min assist Sit to sidelying: Mod assist, +2 for physical assistance General bed mobility comments: Pt required reeducation on log roll technique. VCs and manual assist for reaching to rail to roll. Cues to support self on elbow to push up for sitting, manual trunk assist.    Transfers  Overall transfer level: Needs assistance Equipment used: Rolling walker (2 wheels) Transfers: Sit to/from Stand Sit to Stand: Min assist Bed to/from chair/wheelchair/BSC transfer type:: Step pivot Step pivot transfers: Min assist, From elevated surface General transfer comment: VCs for hand placement for STS transfers, min A for power up.    Ambulation / Gait / Stairs / Wheelchair Mobility   Ambulation/Gait Ambulation/Gait assistance: Min assist, +2 safety/equipment Gait Distance (Feet): 300 Feet Assistive device: Rolling walker (2 wheels) Gait Pattern/deviations: Step-through pattern, Decreased stride length, Shuffle, Knee flexed in stance - right, Knee flexed in stance - left General Gait Details: VCs for longer steps and upright posture as well as keeping RW close to assist with posture. Seated rest break required after 132f. 2 minor LOBs, pt able to self correct with assist from RW Gait velocity: variable Gait velocity interpretation: 1.31 - 2.62 ft/sec, indicative of limited community ambulator Pre-gait activities: stepping forward at bedside with focus on heel strike for gait. Pt with difficulty understanding and completing task.    Posture / Balance Dynamic Sitting Balance Sitting balance - Comments: posterior lean with fatigue. VCs to maintain upright posture during seated therex Balance Overall balance assessment: Needs assistance Sitting-balance support: Bilateral upper extremity supported, Feet supported Sitting balance-Leahy Scale: Good Sitting balance - Comments: posterior lean with fatigue. VCs to maintain upright posture during seated therex Postural control: Posterior lean Standing balance support: Bilateral upper extremity supported, During functional activity, Reliant on assistive device for balance Standing balance-Leahy Scale: Fair Standing balance comment: reliance on RW    Special needs/care consideration Skin patient has neck surgical incision   Previous Home Environment (from acute therapy documentation) Living Arrangements: Alone Available Help at Discharge: Family, Available 24 hours/day Type of Home: House Home Layout: One level Home Access: Stairs to enter Entrance Stairs-Rails: None Entrance Stairs-Number of Steps: 2 at front without rails; 4 at deck that has rails Bathroom Shower/Tub: WMultimedia programmer SSabana No Additional Comments: daughter plans to stay with pt  Discharge Living Setting Plans for Discharge Living Setting: Patient's home, Lives with (comment) (daughter planning to provide assistance when patient returns home) Type of Home at Discharge: House Discharge Home Layout: One level Discharge Home Access: Stairs to enter Entrance Stairs-Rails: None Entrance Stairs-Number of Steps: 2 Discharge Bathroom Shower/Tub: Walk-in shower Discharge Bathroom Toilet: Standard Discharge Bathroom Accessibility: Yes Does the patient have any problems obtaining your medications?: No  Social/Family/Support Systems Anticipated Caregiver: Daughter, CTeacher, musicJessie-Stroud Anticipated CAmbulance personInformation: [phone] Availability: 24/7 Discharge Plan Discussed with Primary Caregiver: Yes Is Caregiver In Agreement with Plan?: Yes Does Caregiver/Family have Issues with Lodging/Transportation while Pt is in Rehab?: No  Goals Patient/Family Goal for Rehab: PT/OT/SLP supervision to mod I Expected length of stay: 6-9 days Pt/Family Agrees to Admission and willing  to participate: Yes Program Orientation Provided & Reviewed with Pt/Caregiver Including Roles  & Responsibilities: Yes  Decrease burden of Care through IP rehab admission: N/A  Possible need for SNF placement upon discharge: not anticipated  Patient Condition: I have reviewed medical records from [facility], spoken with  [facility] team , and patient and daughter. I met with patient at the bedside for inpatient rehabilitation assessment.  Patient will benefit from ongoing PT and OT, can actively participate in 3 hours of therapy a day 5 days of the week, and can make measurable gains during the admission.  Patient will also benefit from the coordinated team approach during an Inpatient Acute Rehabilitation admission.  The patient will receive intensive therapy as well as Rehabilitation physician, nursing, social worker, and  care management interventions.  Due to bladder management, bowel management, safety, skin/wound care, disease management, medication administration, pain management, and patient education the patient requires 24 hour a day rehabilitation nursing.  The patient is currently *** with mobility and basic ADLs.  Discharge setting and therapy post discharge at home with home health is anticipated.  Patient has agreed to participate in the Acute Inpatient Rehabilitation Program and will admit {Time; today/tomorrow:10263}.  Preadmission Screen Completed By: Shann Medal, PT, DPT and Michel Santee, 02/10/2022 12:15 PM ______________________________________________________________________   Discussed status with Dr. Marland Kitchen on *** at *** and received approval for admission today.  Admission Coordinator:  Michel Santee, PT, time Marland KitchenSudie Grumbling ***   Assessment/Plan: Diagnosis: Does the need for close, 24 hr/day Medical supervision in concert with the patient's rehab needs make it unreasonable for this patient to be served in a less intensive setting? {yes_no_potentially:3041433} Co-Morbidities requiring supervision/potential complications: *** Due to {due VJ:5051833}, does the patient require 24 hr/day rehab nursing? {yes_no_potentially:3041433} Does the patient require coordinated care of a physician, rehab nurse, PT, OT, and SLP to address physical and functional deficits in the context of the above medical diagnosis(es)? {yes_no_potentially:3041433} Addressing deficits in the following areas: {deficits:3041436} Can the patient actively participate in an intensive therapy program of at least 3 hrs of therapy 5 days a week? {yes_no_potentially:3041433} The potential for patient to make measurable gains while on inpatient rehab is {potential:3041437} Anticipated functional outcomes upon discharge from inpatient rehab: {functional outcomes:304600100} PT, {functional outcomes:304600100} OT, {functional  outcomes:304600100} SLP Estimated rehab length of stay to reach the above functional goals is: *** Anticipated discharge destination: {anticipated dc setting:21604} 10. Overall Rehab/Functional Prognosis: {potential:3041437}   MD Signature: ***

## 2022-02-07 NOTE — Plan of Care (Signed)
?  Problem: Clinical Measurements: ?Goal: Ability to maintain clinical measurements within normal limits will improve ?Outcome: Progressing ?Goal: Will remain free from infection ?Outcome: Progressing ?Goal: Diagnostic test results will improve ?Outcome: Progressing ?  ?

## 2022-02-07 NOTE — Progress Notes (Addendum)
Mobility Specialist Progress Note   02/07/22 1646  Mobility  Activity  (UE Exercises)  Range of Motion/Exercises Right arm;Left arm  Level of Assistance Independent after set-up  Assistive Device None  Activity Response Tolerated well  $Mobility charge 1 Mobility   Pre Mobility: 90 HR, 93% SpO2 During Mobility: 111 HR, 94% SpO2 Post Mobility: 94 HR, 151/72 BP, 90% SpO2 on RA  Pt and daughter complaining about increased swelling in bilat hands + exhaustion from being up twice today but agreeable to UE exercises for the day. Pt requiring inc'd time throughout session d/t pain and dec'd dexterity. Able to tolerate full session w/o faults or complaints, left in bed w/ call bell in reach.   Holland Falling Mobility Specialist Phone Number [phone]

## 2022-02-07 NOTE — Progress Notes (Signed)
Paged Neurosurgery and Dimas Millin, NP called back. RN made NP aware that patient's daughter is asking if patient still needs IVF. NP gave order to discontinue IVF.

## 2022-02-07 NOTE — Progress Notes (Signed)
Subjective: Patient reports pain better still numb and weak in her arms  Objective: Vital signs in last 24 hours: Temp:  [98.1 F (36.7 C)-98.6 F (37 C)] 98.1 F (36.7 C) (06/03 0601) Pulse Rate:  [88-97] 96 (06/03 0601) Resp:  [21] 21 (06/02 1300) BP: (163-180)/(66-79) 180/75 (06/03 0601) SpO2:  [90 %-95 %] 93 % (06/03 0601)  Intake/Output from previous day: 06/02 0701 - 06/03 0700 In: 273 [P.O.:270; I.V.:3] Out: 1076 [Urine:1075; Stool:1] Intake/Output this shift: No intake/output data recorded.  Lower extremity strength 5 out of 5 grip strength 4 out of 5 deltoid is 1-2 out of 5 neurologically appears to be at her baseline.  Lab Results: No results for input(s): WBC, HGB, HCT, PLT in the last 72 hours. BMET No results for input(s): NA, K, CL, CO2, GLUCOSE, BUN, CREATININE, CALCIUM in the last 72 hours.  Studies/Results: No results found.  Assessment/Plan: Postop day 5 anterior cervical corpectomy making progress continue to work with physical Occupational Therapy and weight on rehab.  LOS: 7 days     Elaina Hoops 02/07/2022, 8:54 AM

## 2022-02-08 NOTE — Plan of Care (Signed)
  Problem: Health Behavior/Discharge Planning: Goal: Ability to manage health-related needs will improve Outcome: Progressing   

## 2022-02-08 NOTE — Progress Notes (Signed)
Subjective: Patient reports  overall doing well improved pain still weakness in her arms and hands noted some swelling yesterday that is not gotten any worse overnight and both upper extremities.  Objective: Vital signs in last 24 hours: Temp:  [98.2 F (36.8 C)-98.5 F (36.9 C)] 98.5 F (36.9 C) (06/04 0805) Pulse Rate:  [95-100] 95 (06/04 0805) Resp:  [18-20] 18 (06/04 0805) BP: (151-196)/(62-116) 196/116 (06/04 0805) SpO2:  [93 %-98 %] 96 % (06/04 0805)  Intake/Output from previous day: 06/03 0701 - 06/04 0700 In: 363 [P.O.:360; I.V.:3] Out: 1050 [Urine:1050] Intake/Output this shift: No intake/output data recorded.  Awake and alert incision clean dry and intact wearing a collar arm and hand strength still weak mild amount of swelling both forearms and hands good pulses and capillary refill  Lab Results: No results for input(s): WBC, HGB, HCT, PLT in the last 72 hours. BMET No results for input(s): NA, K, CL, CO2, GLUCOSE, BUN, CREATININE, CALCIUM in the last 72 hours.  Studies/Results: No results found.  Assessment/Plan: 76 year old status post anterior cervical corpectomy and making some progress with a spinal cord injury with physical therapy.  Mild swelling both upper extremities but symmetric do not feel this represents DVT.  Recommended hot compresses and observation it is not gotten any worse since yesterday.  LOS: 8 days     Elaina Hoops 02/08/2022, 9:07 AM

## 2022-02-08 NOTE — Progress Notes (Signed)
Occupational Therapy Treatment Patient Details Name: Shelby Fleming MRN: [id number] DOB: August 12, 1946 Today's Date: 02/08/2022   History of present illness Pt is a 76 y/o female who admitted after a mechanical fall at home with altered sensation of BUE. 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 Pt underwent C4 corpectomy and C3-5 anterior fusion with removal of previous plate on 3/00. PMH: prior ACDF, HTN, psoriasis.   OT comments  Patient progressing and showed improved shoulder flexion in supine position to allow for scapular stabilization with RUE reaching ~80 degrees with AAROM and LT shoulder reaching close to 90 degrees with AROM, compared to previous session. Pt/family with primary c/o tailbone pain today from bed and chair and Nursing notified with recommendation of pressure relief cushion to chair.  Pt continues to need cues for hand placement and sequencing with transfers and functional mobility, but with need of decreased assistance overall.  Patient remains limited by pain, spinal precautions, cognitive deficits,  generalized weakness and decreased activity tolerance along with deficits noted below. Pt continues to demonstrate good rehab potential and would benefit from continued skilled OT to increase safety and independence with ADLs and functional transfers to allow pt to return home safely and reduce caregiver burden and fall risk.    Recommendations for follow up therapy are one component of a multi-disciplinary discharge planning process, led by the attending physician.  Recommendations may be updated based on patient status, additional functional criteria and insurance authorization.    Follow Up Recommendations  Acute inpatient rehab (3hours/day)    Assistance Recommended at Discharge Frequent or constant Supervision/Assistance  Patient can return home with the following  A lot of  help with walking and/or transfers;A lot of help with bathing/dressing/bathroom;Assistance with cooking/housework;Assist for transportation;Help with stairs or ramp for entrance   Equipment Recommendations  BSC/3in1;Other (comment)    Recommendations for Other Services      Precautions / Restrictions Precautions Precautions: Fall;Cervical Precaution Booklet Issued: Yes (comment) Precaution Comments: Reviewed handout and pt was cued for precautions during functional mobility. Required Braces or Orthoses: Cervical Brace Cervical Brace: Hard collar;At all times;Other (comment) Restrictions Weight Bearing Restrictions: No       Mobility Bed Mobility Overal bed mobility: Needs Assistance Bed Mobility: Sit to Supine       Sit to supine: Min assist   General bed mobility comments: Pt used a long sit pivot to return to supine while keeping back straight and avoiding any twisting. Pt used increased time and use of UEs to support scooting.    Transfers                         Balance Overall balance assessment: Needs assistance Sitting-balance support: Bilateral upper extremity supported, Feet supported Sitting balance-Leahy Scale: Good Sitting balance - Comments: posterior lean with fatigue   Standing balance support: Bilateral upper extremity supported, During functional activity, Reliant on assistive device for balance Standing balance-Leahy Scale: Fair Standing balance comment: reliance on RWsafety                           ADL either performed or assessed with clinical judgement   ADL                           Toilet Transfer: Minimal assistance;Stand-pivot;Rolling walker (2 wheels)  Toilet Transfer Details (indicate cue type and reason): Pt stood from recliner with cues for hand placement and Min As. Pt pivoted to EOB taking ~5 lateral steps with RW. Min As and cues for UE reach back to control descent with Min As         Functional  mobility during ADLs: Minimal assistance;Rolling walker (2 wheels)      Extremity/Trunk Assessment Upper Extremity Assessment RUE Deficits / Details: Edema to hand. Limited shoulder AROM with upper trap compensation noted. See exercises. LUE Deficits / Details: Edema to hand. AROM of shoulder to ~70 degrees in chair.            Vision Baseline Vision/History: 1 Wears glasses Ability to See in Adequate Light: 1 Impaired Patient Visual Report: No change from baseline     Perception     Praxis      Cognition Arousal/Alertness: Awake/alert Behavior During Therapy: WFL for tasks assessed/performed                                 Problem Solving: Slow processing, Requires verbal cues General Comments: Daughter and son in law present and very supportive of pt, but unable to get much a sense for pt's cognition this visit due to family being very involved and often answering for pt.        Exercises Shoulder Exercises Digit Composite Flexion: AROM, 10 reps, Seated Composite Extension: AROM, 10 reps, Seated Other Exercises Other Exercises: In recliner pt performed lap slides to BUEs x 5 to inhibit RT shoulder compensatory shrugging. Once in supine in bed pt's HOB lowered and pt performed AAROM ceiling punch to RT UE with pt reaching ~80 degrees and A/AAROM to LT shoulder with pt reaching about 90 degrees. Cues to keep eyes on UE for more precision movement. Other Exercises: Each UE elevated on pillow for edema mangagement and pt encouraged to perform hand exercises th/o day for edema control.    Shoulder Instructions       General Comments      Pertinent Vitals/ Pain       Pain Assessment Pain Assessment: Faces Faces Pain Scale: Hurts a little bit Pain Location: [place] Pain Descriptors / Indicators: Sore Pain Intervention(s): Limited activity within patient's tolerance, Monitored during session, Repositioned  Home Living                                           Prior Functioning/Environment              Frequency  Min 3X/week        Progress Toward Goals  OT Goals(current goals can now be found in the care plan section)  Progress towards OT goals: Progressing toward goals  Acute Rehab OT Goals Patient Stated Goal: Move UEs better and control hand edema. OT Goal Formulation: With patient/family Time For Goal Achievement: 02/17/22 Potential to Achieve Goals: Good  Plan Discharge plan remains appropriate    Co-evaluation                 AM-PAC OT "6 Clicks" Daily Activity     Outcome Measure   Help from another person eating meals?: A Little Help from another person taking care of personal grooming?: A Little Help from another person toileting, which includes using toliet, bedpan, or urinal?: A Lot  Help from another person bathing (including washing, rinsing, drying)?: A Lot Help from another person to put on and taking off regular upper body clothing?: A Little Help from another person to put on and taking off regular lower body clothing?: A Lot 6 Click Score: 15    End of Session Equipment Utilized During Treatment: Gait belt;Rolling walker (2 wheels);Cervical collar  OT Visit Diagnosis: Unsteadiness on feet (R26.81);Other abnormalities of gait and mobility (R26.89);Muscle weakness (generalized) (M62.81)   Activity Tolerance Patient tolerated treatment well   Patient Left in bed;with call bell/phone within reach;with bed alarm set;with family/visitor present   Nurse Communication Other (comment) (Request for pressure relief cushion to recliner to address tailbone soreness.)        Time: 5789-7847 OT Time Calculation (min): 28 min  Charges: OT General Charges $OT Visit: 1 Visit OT Treatments $Therapeutic Activity: 8-22 mins $Therapeutic Exercise: 8-22 mins  Anderson Malta, OT Acute Rehab Services Office: [phone] 02/08/2022  Julien Girt 02/08/2022, 10:23 AM

## 2022-02-09 MED ORDER — GABAPENTIN 100 MG PO CAPS
100.0000 mg | ORAL_CAPSULE | Freq: Two times a day (BID) | ORAL | Status: DC
  Administered 2022-02-09 – 2022-02-11 (×5): 100 mg via ORAL
  Filled 2022-02-09 (×4): qty 1

## 2022-02-09 NOTE — Progress Notes (Signed)
Subjective: Patient reports that she is doing well. Her throat soreness / dysphagia has significantly improved. BUE edema that was present over the weekend has nearly resolved.    Objective: Vital signs in last 24 hours: Temp:  [97.9 F (36.6 C)-99 F (37.2 C)] 97.9 F (36.6 C) (06/05 0538) Pulse Rate:  [93-101] 93 (06/05 0538) Resp:  [15-18] 15 (06/05 0538) BP: (137-196)/(65-116) 150/67 (06/05 0538) SpO2:  [92 %-96 %] 93 % (06/05 0538)  Intake/Output from previous day: 06/04 0701 - 06/05 0700 In: 843 [P.O.:840; I.V.:3] Out: 900 [Urine:900] Intake/Output this shift: No intake/output data recorded.  Physical Exam: Patient is A/O X 4. Speech is fluent and appropriate. She is in good spirits and conversant. MAEW. BUE 4-/5 except for grip strength 3/5 on the left and 4-/5 on the right. BLE 4+/5 throughout. Sensation to light touch is intact. PERLA, EOMI. CNs grossly intact. Dressing is clean dry intact. Incision is well approximated with no drainage, erythema, or edema. Hard cervical collar in place.   Lab Results: No results for input(s): WBC, HGB, HCT, PLT in the last 72 hours. BMET No results for input(s): NA, K, CL, CO2, GLUCOSE, BUN, CREATININE, CALCIUM in the last 72 hours.  Studies/Results: No results found.  Assessment/Plan: Patient is s/p C4 corpectomy with C3-5 anterior fusion due to C4 fracture with spinal cord injury on 02/02/2022. She is continuing to slowly increase her strength as well as decreasing paraesthesia. She has appropriate incisional discomfort as well as shoulder and upper back discomfort. PT/OT recommending CIR for later convalescence. Her dysphagia has significantly improved and her appetite has improved. Foley placed for urinary retention. Will plan to remove foley and see if she is able to void spontaneously. Continue hard cervical collar. Continue working on pain control, mobility and ambulating patient. Awaiting CIR placement.        -Continue hard  cervical collar -Pain control -PT/OT -Encourage mobilization -Limit narcotic pain medication as able -Awaiting CIR placement    LOS: 9 days     Marvis Moeller, DNP, AGNP-C Neurosurgery Nurse Practitioner  [facility] [address], [place], [place] [address] P: [phone]    F: [phone]  02/09/2022 7:38 AM

## 2022-02-09 NOTE — Progress Notes (Signed)
Physical Therapy Treatment Patient Details Name: Shelby Fleming MRN: [id number] DOB: 10-06-1945 Today's Date: 02/09/2022   History of Present Illness Pt is a 76 y/o female who admitted after a mechanical fall at home with altered sensation of BUE. 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 Pt underwent C4 corpectomy and C3-5 anterior fusion with removal of previous plate on 3/88. PMH: prior ACDF, HTN, psoriasis.    PT Comments    Pt complaining of soreness on bottom this session. Therapist observed blanchable redness and one small area of breakdown. RN notified and present to assess; sacrum and peri area covered with foam. Pt demonstrated increased ability to maintain longer steps during ambulation but still requiring cueing throughout for step length and posture. Pt demonstrated increased tolerance to activity this session and increased use of UES for ADL task of brushing teeth. Inpatient rehab continues to be most appropriate discharge disposition due to pts functional mobility deficits and required assistance levels.     Recommendations for follow up therapy are one component of a multi-disciplinary discharge planning process, led by the attending physician.  Recommendations may be updated based on patient status, additional functional criteria and insurance authorization.  Follow Up Recommendations  Acute inpatient rehab (3hours/day)     Assistance Recommended at Discharge Frequent or constant Supervision/Assistance  Patient can return home with the following A lot of help with walking and/or transfers;A lot of help with bathing/dressing/bathroom;Assistance with feeding;Assist for transportation;Help with stairs or ramp for entrance   Equipment Recommendations  Rolling walker (2 wheels);BSC/3in1    Recommendations for Other Services       Precautions / Restrictions Precautions Precautions:  Fall;Cervical Precaution Booklet Issued: Yes (comment) Precaution Comments: Reviewed handout and pt was cued for precautions during functional mobility. Required Braces or Orthoses: Cervical Brace Cervical Brace: Hard collar;At all times;Other (comment) Restrictions Weight Bearing Restrictions: No     Mobility  Bed Mobility Overal bed mobility: Needs Assistance Bed Mobility: Rolling, Sidelying to Sit Rolling: Mod assist Sidelying to sit: Min assist, HOB elevated       General bed mobility comments: Pt required reeducation on log roll technique. VCs and manual assist for reaching to rail to roll. Cues to support self on elbow to push up for sitting, manual trunk assist.    Transfers Overall transfer level: Needs assistance Equipment used: Rolling walker (2 wheels) Transfers: Sit to/from Stand Sit to Stand: Min assist           General transfer comment: VCs for hand placement for STS transfers, min A for power up.    Ambulation/Gait Ambulation/Gait assistance: Min assist, +2 safety/equipment Gait Distance (Feet): 300 Feet Assistive device: Rolling walker (2 wheels) Gait Pattern/deviations: Step-through pattern, Decreased stride length, Shuffle, Knee flexed in stance - right, Knee flexed in stance - left Gait velocity: variable Gait velocity interpretation: 1.31 - 2.62 ft/sec, indicative of limited community ambulator   General Gait Details: VCs for longer steps and upright posture as well as keeping RW close to assist with posture. Seated rest break required after 115f. 2 minor LOBs, pt able to self correct with assist from [employer]    Modified Rankin (Stroke Patients Only)       Balance Overall balance assessment: Needs assistance Sitting-balance support: Bilateral upper extremity supported, Feet supported Sitting balance-Leahy Scale: Good  Sitting balance - Comments: posterior lean with fatigue. VCs to maintain upright  posture during seated therex Postural control: Posterior lean Standing balance support: Bilateral upper extremity supported, During functional activity, Reliant on assistive device for balance Standing balance-Leahy Scale: Fair Standing balance comment: reliance on RW                            Cognition Arousal/Alertness: Awake/alert Behavior During Therapy: WFL for tasks assessed/performed Overall Cognitive Status: Impaired/Different from baseline Area of Impairment: Problem solving, Following commands, Attention, Safety/judgement, Awareness                   Current Attention Level: Selective   Following Commands: Follows one step commands with increased time Safety/Judgement: Decreased awareness of safety, Decreased awareness of deficits Awareness: Emergent Problem Solving: Slow processing, Requires verbal cues          Exercises General Exercises - Lower Extremity Long Arc Quad: Both, 5 reps Heel Slides: Both, 5 reps Straight Leg Raises: Both, 5 reps    General Comments        Pertinent Vitals/Pain Pain Assessment Pain Assessment: Faces Faces Pain Scale: Hurts a little bit Pain Location: bottom, hands Pain Descriptors / Indicators: Sore Pain Intervention(s): Monitored during session (applied foam to bottom)    Home Living                          Prior Function            PT Goals (current goals can now be found in the care plan section) Acute Rehab PT Goals Patient Stated Goal: Return to PLOF PT Goal Formulation: With patient/family Time For Goal Achievement: 02/17/22 Potential to Achieve Goals: Good Progress towards PT goals: Progressing toward goals    Frequency    Min 5X/week      PT Plan Current plan remains appropriate    Co-evaluation              AM-PAC PT "6 Clicks" Mobility   Outcome Measure  Help needed turning from your back to your side while in a flat bed without using bedrails?: A Lot Help  needed moving from lying on your back to sitting on the side of a flat bed without using bedrails?: A Lot Help needed moving to and from a bed to a chair (including a wheelchair)?: A Little Help needed standing up from a chair using your arms (e.g., wheelchair or bedside chair)?: A Little Help needed to walk in hospital room?: A Little Help needed climbing 3-5 steps with a railing? : A Lot 6 Click Score: 15    End of Session Equipment Utilized During Treatment: Gait belt;Cervical collar Activity Tolerance: Patient tolerated treatment well Patient left: in chair;with call bell/phone within reach;with chair alarm set;with family/visitor present Nurse Communication: Mobility status PT Visit Diagnosis: Unsteadiness on feet (R26.81);Other symptoms and signs involving the nervous system (R29.898)     Time: 7902-4097 PT Time Calculation (min) (ACUTE ONLY): 56 min  Charges:  $Gait Training: 23-37 mins $Therapeutic Exercise: 8-22 mins $Therapeutic Activity: 8-22 mins                     Mackie Pai, SPT Acute Rehabilitation Services  Office: [place] 02/09/2022, 10:37 AM

## 2022-02-09 NOTE — Progress Notes (Signed)
Mobility Specialist Progress Note   02/09/22 1824  Mobility  Activity Ambulated with assistance in hallway  Level of Assistance Minimal assist, patient does 75% or more  Assistive Device Front wheel walker  Distance Ambulated (ft) 244 ft  Activity Response Tolerated well  $Mobility charge 1 Mobility   Pre Mobility: 106 HR During Mobility: 129 HR Post Mobility: 112 HR  Received in bed having no complaints and agreeable to mobility. X1 seated break d/t some onsetting R hip pain while ambulating, pt believes is arthritis. Pain quickly subsided after a 63mn break, pt having no more faults for remainder of session while following good cervical precautions. Returned back to the bed w/ call bell in reach and all needs met.  JHolland FallingMobility Specialist Phone Number [phone]

## 2022-02-09 NOTE — Progress Notes (Signed)
Inpatient Rehab Admissions Coordinator:   I received insurance approval for CIR, however I do not have a bed available for this pt today.  Will update pt/family at bedside.  Will continue to follow for timing of potential admit pending bed availability.   Shann Medal, PT, DPT Admissions Coordinator [phone] 02/09/22  9:18 AM

## 2022-02-10 MED ORDER — TAMSULOSIN HCL 0.4 MG PO CAPS
0.4000 mg | ORAL_CAPSULE | Freq: Once | ORAL | Status: AC
Start: 2022-02-10 — End: 2022-02-10
  Administered 2022-02-10: 0.4 mg via ORAL
  Filled 2022-02-10: qty 1

## 2022-02-10 NOTE — H&P (Incomplete)
Physical Medicine and Rehabilitation Admission H&P    Chief Complaint  Patient presents with   Fall  : HPI: Shelby Fleming is a 76 year old right-handed female with history of hypertension, psoriasis as well as prior history ACDF.  Per chart review patient lives alone.  1 level home with 2 steps to entry.  Independent prior to admission.  Daughter plans to provide assistance as needed on discharge.  Presented 01/31/2022 after mechanical fall.  By report patient tripped and fell into her storm door on her left side with severe dysesthesias to all 4 extremities.  No loss of consciousness.  Denied any syncope associated with the fall.  Cranial CT scan negative.  Bilateral hip and pelvis films negative.  CT cervical spine showed fracture through the C4 vertebral body in the inferior anterior corner at C3.  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  MRI cervical spine again known C4 body and C3 anterior inferior corner fractures.  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  No visible injury to the posterior elements.  C4-5 cord flattening primarily from central disc protrusion.  Milder degenerative spinal stenosis C3-4.  By foraminal impingement at C3-4 and C4-5.  Left foraminal impingement of C7-T1 as well as C5-C7 ACDF.  Underwent C4 corpectomy, anterior interbody technique including discectomies and bilateral foraminotomies.  Placement of corpectomy cage at C4.  Placement of anterior instrumentation consisting of cervical plate and screws I3-3-8 02/02/2022 per Dr. Duffy Rhody.  She was placed in a cervical hard collar and was able to remove for showering.  She was cleared to begin Lovenox for DVT prophylaxis 02/03/2022.  Her diet has been advanced to a regular consistency.  She did have some urinary retention suspect neurogenic bladder initially placed on Flomax since discontinued with lattest bladder scan 451 ml  and refused intermittent  catheters patient considering Foley to.  Therapy evaluations completed due to patient decreased functional mobility was admitted for a comprehensive rehab program.  Review of Systems  Constitutional:  Negative for chills and fever.  HENT:  Negative for hearing loss.   Eyes:  Negative for blurred vision and double vision.  Respiratory:  Negative for cough and shortness of breath.   Cardiovascular:  Negative for chest pain, palpitations and leg swelling.  Gastrointestinal:  Positive for constipation. Negative for heartburn, nausea and vomiting.  Genitourinary:  Negative for dysuria, flank pain and hematuria.  Musculoskeletal:  Positive for joint pain and myalgias.  Skin:  Negative for rash.  Neurological:  Positive for sensory change and weakness.  All other systems reviewed and are negative. Past Medical History:  Diagnosis Date   Hypertension    Psoriasis    Past Surgical History:  Procedure Laterality Date   ABDOMINAL HYSTERECTOMY     ANTERIOR CERVICAL DECOMP/DISCECTOMY FUSION N/A 02/02/2022   Procedure: CERVICAL FOUR CORPECTOMY WITH PLATE REMOVAL;  Surgeon: Vallarie Mare, MD;  Location: [facility];  Service: Neurosurgery;  Laterality: N/A;   BACK SURGERY     History reviewed. No pertinent family history. Social History:  reports that she has never smoked. She has never used smokeless tobacco. She reports that she does not drink alcohol and does not use drugs. Allergies:  Allergies  Allergen Reactions   Atorvastatin Other (See Comments)    Muscle weakness   Codeine Nausea Only   Ezetimibe Other (See Comments)    Muscle weakness   Pitavastatin Other (See Comments)    Muscle weakness  Pravastatin Other (See Comments)    Weakness    Rosuvastatin Other (See Comments)    Muscle weakness   Medications Prior to Admission  Medication Sig Dispense Refill   Apremilast (OTEZLA) 30 MG TABS Take 30 mg by mouth 2 (two) times daily.     D 1000 25 MCG (1000 UT) capsule Take 1,000 Units  by mouth daily.     ibuprofen (ADVIL) 200 MG tablet Take 200 mg by mouth every 6 (six) hours as needed for mild pain.     lisinopril (PRINIVIL,ZESTRIL) 40 MG tablet Take 40 mg by mouth daily.     loratadine (CLARITIN) 10 MG tablet Take 10 mg by mouth daily.     omega-3 acid ethyl esters (LOVAZA) 1 g capsule Take 2 g by mouth 2 (two) times daily.        Home: Home Living Family/patient expects to be discharged to:: Private residence Living Arrangements: Alone Available Help at Discharge: Family, Available 24 hours/day Type of Home: House Home Access: Stairs to enter [employer] of Steps: 2 at front without rails; 4 at deck that has rails Entrance Stairs-Rails: None Home Layout: One level Bathroom Shower/Tub: Multimedia programmer: [place]: None Additional Comments: daughter plans to stay with pt   Functional History: Prior Function Prior Level of Function : Independent/Modified Independent, Driving Mobility Comments: no use of AD ADLs Comments: Independent in all daily tasks, active at baseline  Functional Status:  Mobility: Bed Mobility Overal bed mobility: Needs Assistance Bed Mobility: Rolling, Sidelying to Sit Rolling: Mod assist Sidelying to sit: Min assist, HOB elevated Supine to sit: Mod assist Sit to supine: Min assist Sit to sidelying: Mod assist, +2 for physical assistance General bed mobility comments: pt OOB on arrival Transfers Overall transfer level: Needs assistance Equipment used: Rolling walker (2 wheels) Transfers: Sit to/from Stand Sit to Stand: Min assist, Min guard Bed to/from chair/wheelchair/BSC transfer type:: Step pivot Step pivot transfers: Min assist, From elevated surface General transfer comment: VCs for hand placement, min assist down to min guard with practice Ambulation/Gait Ambulation/Gait assistance: Min assist, +2 safety/equipment Gait Distance (Feet): 200 Feet (x2) Assistive device: Rolling  walker (2 wheels) Gait Pattern/deviations: Step-through pattern, Decreased stride length, Shuffle, Knee flexed in stance - right, Knee flexed in stance - left General Gait Details: slow guarded gait with cues fro RW proximity as pt getting very close to front rail, cues for posture and to maintain positiion in center of RW as pt with forward flexion and drfiting to L of center in RW Gait velocity: variable Gait velocity interpretation: 1.31 - 2.62 ft/sec, indicative of limited community ambulator Pre-gait activities: stepping forward at bedside with focus on heel strike for gait. Pt with difficulty understanding and completing task.    ADL: ADL Overall ADL's : Needs assistance/impaired Eating/Feeding: Set up, Sitting Grooming: Minimal assistance, Sitting, Oral care, Wash/dry face Grooming Details (indicate cue type and reason): able to hold regular toothbrush though did drop once during task due to sensation/coordination deficits. unable to open toothpaste without assist and place on toothbrush. daughter jumping in to assist with rinse/spit cups Upper Body Bathing: Moderate assistance, Sitting Lower Body Bathing: Maximal assistance, Sit to/from stand Upper Body Dressing : Moderate assistance, Sitting Lower Body Dressing: Maximal assistance, Sit to/from stand Lower Body Dressing Details (indicate cue type and reason): to don shoes Toilet Transfer: Minimal assistance, Stand-pivot, Rolling walker (2 wheels) Toilet Transfer Details (indicate cue type and reason): Pt stood from recliner with cues for  hand placement and Min As. Pt pivoted to EOB taking ~5 lateral steps with RW. Min As and cues for UE reach back to control descent with Min As Toileting- Water quality scientist and Hygiene: Maximal assistance, Sit to/from stand Toileting - Clothing Manipulation Details (indicate cue type and reason): assist for peri care in standing Functional mobility during ADLs: Minimal assistance, Rolling walker (2  wheels) General ADL Comments: Continued emphasis on gait belt use, coordination with smaller ADL items and when to use foam grips. educated on collar mgmt, removing pads for washing and placing paper towels/washcloths to minimize soilage on collar  Cognition: Cognition Overall Cognitive Status: Impaired/Different from baseline Orientation Level: Oriented X4 Cognition Arousal/Alertness: Awake/alert Behavior During Therapy: WFL for tasks assessed/performed Overall Cognitive Status: Impaired/Different from baseline Area of Impairment: Problem solving, Following commands, Attention, Safety/judgement, Awareness Current Attention Level: Selective Following Commands: Follows one step commands with increased time Safety/Judgement: Decreased awareness of safety, Decreased awareness of deficits Awareness: Emergent Problem Solving: Slow processing, Requires verbal cues General Comments: Daughter and son in law present and very supportive of pt, but unable to get much a sense for pt's cognition this visit due to family being very involved and often answering for pt.  Physical Exam: Blood pressure (!) 109/45, pulse 92, temperature 98.3 F (36.8 C), temperature source Oral, resp. rate (!) 22, height '5\' 4"'$  (1.626 m), weight 89.1 kg, SpO2 93 %. Physical Exam Skin:    Comments: Incision site clean and dry.  Neurological:     Comments: Patient is alert.  Oriented x3 and follows commands.    No results found for this or any previous visit (from the past 48 hour(s)). No results found.    Blood pressure (!) 109/45, pulse 92, temperature 98.3 F (36.8 C), temperature source Oral, resp. rate (!) 22, height '5\' 4"'$  (1.626 m), weight 89.1 kg, SpO2 93 %.  Medical Problem List and Plan: 1. 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  Status post C4 corpectomy including discectomies and bilateral foraminotomies with placement of corpectomy cage C4 02/02/2022.  Cervical collar as  directed  -patient may *** shower  -ELOS/Goals: *** 2.  Antithrombotics: -DVT/anticoagulation:  Pharmaceutical: Lovenox check vascular study  -antiplatelet therapy: N/A 3. Pain Management: Tramadol 50 mg every 6 hours, Neurontin 100 mg twice daily, Flexeril 10 mg 3 times daily as needed, oxycodone as needed 4. Mood: Provide emotional support  -antipsychotic agents: N/A 5. Neuropsych: This patient is capable of making decisions on her own behalf. 6. Skin/Wound Care: Routine skin checks 7. Fluids/Electrolytes/Nutrition: Routine in and outs with follow-up chemistries 8.  Neurogenic bowel and bladder.  Check PVR.  Patient had initially refused intermittent catheterization.  Establish bowel program 9.  Dysphagia.  Improved.  Diet advanced to regular 10.  Hypertension.  Patient on lisinopril 40 mg daily prior to admission.  Resume as needed 11.  Hyperlipidemia.  Resume Lovaza 2 g twice daily 12.  Psoriasis.  Resume Oteleza 30 mg twice daily   Cathlyn Parsons, PA-C 02/11/2022

## 2022-02-10 NOTE — Progress Notes (Signed)
Inpatient Rehab Admissions Coordinator:   I have no beds available for this patient to admit to CIR today.  Will continue to follow for timing of potential admission pending bed availability.   Shann Medal, PT, DPT Admissions Coordinator [phone] 02/10/22  9:58 AM

## 2022-02-10 NOTE — Progress Notes (Signed)
Physical Therapy Treatment Patient Details Name: Shelby Fleming MRN: [id number] DOB: 11/29/1945 Today's Date: 02/10/2022   History of Present Illness Pt is a 76 y/o female who admitted after a mechanical fall at home with altered sensation of BUE. 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 Pt underwent C4 corpectomy and C3-5 anterior fusion with removal of previous plate on 5/63. PMH: prior ACDF, HTN, psoriasis.    PT Comments    Pt received OOB in recliner on arrival and agreeable to session. Pt able to demonstrate ambulation with min assist to steady, especially around obstacles in room , cues for RW proximity and posture throughout and +2 persons for chair follow for safety as pt continues to fatigue quickly, without LOB but noted general instability with RW. Pt limited by chronic LLE pain this session. Current plan remains appropriate to address deficits and maximize functional independence and decrease caregiver burden. Pt continues to benefit from skilled PT services to progress toward functional mobility goals.     Recommendations for follow up therapy are one component of a multi-disciplinary discharge planning process, led by the attending physician.  Recommendations may be updated based on patient status, additional functional criteria and insurance authorization.  Follow Up Recommendations  Acute inpatient rehab (3hours/day)     Assistance Recommended at Discharge Frequent or constant Supervision/Assistance  Patient can return home with the following A lot of help with walking and/or transfers;A lot of help with bathing/dressing/bathroom;Assistance with feeding;Assist for transportation;Help with stairs or ramp for entrance   Equipment Recommendations  Rolling walker (2 wheels);BSC/3in1    Recommendations for Other Services       Precautions / Restrictions Precautions Precautions:  Fall;Cervical Precaution Booklet Issued: Yes (comment) Precaution Comments: Reviewed handout and pt was cued for precautions during functional mobility. Required Braces or Orthoses: Cervical Brace Cervical Brace: Hard collar;At all times;Other (comment) Restrictions Weight Bearing Restrictions: No     Mobility  Bed Mobility Overal bed mobility: Needs Assistance             General bed mobility comments: pt OOB on arrival    Transfers Overall transfer level: Needs assistance Equipment used: Rolling walker (2 wheels) Transfers: Sit to/from Stand Sit to Stand: Min assist, Min guard           General transfer comment: VCs for hand placement, min assist down to min guard with practice    Ambulation/Gait Ambulation/Gait assistance: Min assist, +2 safety/equipment Gait Distance (Feet): 200 Feet (x2) Assistive device: Rolling walker (2 wheels) Gait Pattern/deviations: Step-through pattern, Decreased stride length, Shuffle, Knee flexed in stance - right, Knee flexed in stance - left Gait velocity: variable     General Gait Details: slow guarded gait with cues fro RW proximity as pt getting very close to front rail, cues for posture and to maintain positiion in center of RW as pt with forward flexion and drfiting to L of center in RW   Stairs             Wheelchair Mobility    Modified Rankin (Stroke Patients Only)       Balance Overall balance assessment: Needs assistance Sitting-balance support: Bilateral upper extremity supported, Feet supported Sitting balance-Leahy Scale: Good Sitting balance - Comments: posterior lean with fatigue. VCs to maintain upright posture during seated therex Postural control: Posterior lean Standing balance support: Bilateral upper extremity supported, During functional activity, Reliant on assistive device for  balance Standing balance-Leahy Scale: Fair Standing balance comment: reliance on RW                             Cognition Arousal/Alertness: Awake/alert Behavior During Therapy: WFL for tasks assessed/performed Overall Cognitive Status: Impaired/Different from baseline Area of Impairment: Problem solving, Following commands, Attention, Safety/judgement, Awareness                   Current Attention Level: Selective   Following Commands: Follows one step commands with increased time Safety/Judgement: Decreased awareness of safety, Decreased awareness of deficits Awareness: Emergent Problem Solving: Slow processing, Requires verbal cues          Exercises      General Comments General comments (skin integrity, edema, etc.): VSS on RA, HR max 122bpm during ambulation      Pertinent Vitals/Pain Pain Assessment Pain Assessment: Faces Faces Pain Scale: Hurts little more Pain Location: L foot and hip Pain Descriptors / Indicators: Sore Pain Intervention(s): Monitored during session, Limited activity within patient's tolerance    Home Living                          Prior Function            PT Goals (current goals can now be found in the care plan section) Acute Rehab PT Goals Patient Stated Goal: Return to PLOF PT Goal Formulation: With patient/family Time For Goal Achievement: 02/17/22    Frequency    Min 5X/week      PT Plan Current plan remains appropriate    Co-evaluation              AM-PAC PT "6 Clicks" Mobility   Outcome Measure  Help needed turning from your back to your side while in a flat bed without using bedrails?: A Lot Help needed moving from lying on your back to sitting on the side of a flat bed without using bedrails?: A Lot Help needed moving to and from a bed to a chair (including a wheelchair)?: A Little Help needed standing up from a chair using your arms (e.g., wheelchair or bedside chair)?: A Little Help needed to walk in hospital room?: A Little Help needed climbing 3-5 steps with a railing? : A Lot 6 Click Score:  15    End of Session Equipment Utilized During Treatment: Gait belt;Cervical collar Activity Tolerance: Patient tolerated treatment well Patient left: in chair;with family/visitor present (on [facility] per RN request) Nurse Communication: Mobility status PT Visit Diagnosis: Unsteadiness on feet (R26.81);Other symptoms and signs involving the nervous system (U88.916)     Time: 9450-3888 PT Time Calculation (min) (ACUTE ONLY): 39 min  Charges:  $Gait Training: 23-37 mins $Therapeutic Activity: 8-22 mins                    Cherese Lozano R. PTA Acute Rehabilitation Services Office: [place] 02/10/2022, 12:17 PM

## 2022-02-10 NOTE — Progress Notes (Signed)
Subjective: Patient reports continued slow improvement. Foley removed yesterday. Urinary retention persists. Flomax started.   Objective: Vital signs in last 24 hours: Temp:  [98 F (36.7 C)-98.5 F (36.9 C)] 98 F (36.7 C) (06/06 0414) Pulse Rate:  [86-102] 86 (06/06 0414) Resp:  [18-20] 20 (06/06 0414) BP: (126-138)/(59-97) 132/59 (06/06 0414) SpO2:  [92 %-100 %] 96 % (06/06 0414)  Intake/Output from previous day: No intake/output data recorded. Intake/Output this shift: No intake/output data recorded.  Physical Exam: Patient is A/O X 4. Speech is fluent and appropriate. She is in good spirits and conversant. MAEW. BUE 4-/5 except for grip strength 3/5 on the left and 4-/5 on the right. BLE 4+/5 throughout. Sensation to light touch is intact. PERLA, EOMI. CNs grossly intact. Dressing is clean dry intact. Incision is well approximated with no drainage, erythema, or edema. Hard cervical collar in place.  Lab Results: No results for input(s): WBC, HGB, HCT, PLT in the last 72 hours. BMET No results for input(s): NA, K, CL, CO2, GLUCOSE, BUN, CREATININE, CALCIUM in the last 72 hours.  Studies/Results: No results found.  Assessment/Plan: Patient is s/p C4 corpectomy with C3-5 anterior fusion due to C4 fracture with spinal cord injury on 02/02/2022. She is continuing to slowly increase her strength as well as decreasing paraesthesia. She has appropriate incisional discomfort as well as shoulder and upper back discomfort. PT/OT recommending CIR for later convalescence. Her dysphagia is much better and very mild at this point. Foley removed yesterday for void trial. Will plan to continue Flomax and bladder scans with I/O cath X 3. If patient continues to have urinary retention, will plan to replace Foley. Continue hard cervical collar. Continue working on pain control, mobility and ambulating patient. Awaiting CIR placement.      -Continue hard cervical collar -Pain  control -PT/OT -Encourage mobilization -Limit narcotic pain medication as able -Awaiting CIR placement  LOS: 10 days     Marvis Moeller, DNP, AGNP-C Neurosurgery Nurse Practitioner  [facility] [address], [address], [place], [place] [address] P: [phone]    F: [phone]  02/10/2022 8:05 AM

## 2022-02-10 NOTE — Progress Notes (Signed)
Pt and daughter is requesting for FishOil. She normally takes FishOil at home and has not been taking FishOil since admission, pt is currently experiencing joint pain and pt relates it to OA.

## 2022-02-10 NOTE — Progress Notes (Signed)
Patient got her foley removed on 02/09/2022 at around 1200 according to day nurse. Patient's daughter stated she voided once and that was very less in amount post removal. Did bladder scanning at 2000, showed 350 ml. Patient and her daughter refused re-insertion of foley or to do in and out cath. Patient's daughter requested to wait for some more time. Provided heating pads and re-scanned at 12 am which showed 370 ml. The night shift neurosurgeon Dr. Ellene Route was made aware of the situation. Dr. Ellene Route suggested to make the patient and her daughter aware of consequence. Patient and her daughter was explained about consequence that may occur if the bladder volume exceeds 500 cc. Patient agreed to try alternative ways, sat in bed side commode, walked around and took plenty of fluid but couldn't void even though had an urge. Bladder re- scanned at 0500, showed 451 ml urine. Patient in a mind to do intermittent catheterization. Talked to Dr. Ellene Route for an order, got a verbal order to give Cap. Flomax 0.4 mg, wait for 30 minutes and insert straight catheter if unable to void.

## 2022-02-11 ENCOUNTER — Encounter (HOSPITAL_COMMUNITY): Payer: Self-pay | Admitting: Physical Medicine and Rehabilitation

## 2022-02-11 ENCOUNTER — Inpatient Hospital Stay (HOSPITAL_COMMUNITY)
Admission: RE | Admit: 2022-02-11 | Discharge: 2022-02-25 | DRG: 945 | Disposition: A | Discharge status: Home / Self Care | Payer: Medicare Other | Source: Intra-hospital | Attending: Physical Medicine and Rehabilitation | Admitting: Physical Medicine and Rehabilitation

## 2022-02-11 ENCOUNTER — Other Ambulatory Visit: Payer: Self-pay

## 2022-02-11 DIAGNOSIS — G8252 Quadriplegia, C1-C4 incomplete: Secondary | ICD-10-CM | POA: Diagnosis present

## 2022-02-11 DIAGNOSIS — R1310 Dysphagia, unspecified: Secondary | ICD-10-CM | POA: Diagnosis present

## 2022-02-11 DIAGNOSIS — L89312 Pressure ulcer of right buttock, stage 2: Secondary | ICD-10-CM | POA: Diagnosis present

## 2022-02-11 DIAGNOSIS — E440 Moderate protein-calorie malnutrition: Secondary | ICD-10-CM | POA: Diagnosis present

## 2022-02-11 DIAGNOSIS — K5900 Constipation, unspecified: Secondary | ICD-10-CM | POA: Diagnosis present

## 2022-02-11 DIAGNOSIS — W01198D Fall on same level from slipping, tripping and stumbling with subsequent striking against other object, subsequent encounter: Secondary | ICD-10-CM | POA: Diagnosis present

## 2022-02-11 DIAGNOSIS — L409 Psoriasis, unspecified: Secondary | ICD-10-CM | POA: Diagnosis not present

## 2022-02-11 DIAGNOSIS — S14129A Central cord syndrome at unspecified level of cervical spinal cord, initial encounter: Secondary | ICD-10-CM | POA: Diagnosis not present

## 2022-02-11 DIAGNOSIS — M19049 Primary osteoarthritis, unspecified hand: Secondary | ICD-10-CM | POA: Diagnosis not present

## 2022-02-11 DIAGNOSIS — M19042 Primary osteoarthritis, left hand: Secondary | ICD-10-CM | POA: Diagnosis present

## 2022-02-11 DIAGNOSIS — E785 Hyperlipidemia, unspecified: Secondary | ICD-10-CM | POA: Diagnosis not present

## 2022-02-11 DIAGNOSIS — S14124D Central cord syndrome at C4 level of cervical spinal cord, subsequent encounter: Secondary | ICD-10-CM | POA: Diagnosis not present

## 2022-02-11 DIAGNOSIS — I10 Essential (primary) hypertension: Secondary | ICD-10-CM | POA: Diagnosis present

## 2022-02-11 DIAGNOSIS — L4050 Arthropathic psoriasis, unspecified: Secondary | ICD-10-CM | POA: Diagnosis present

## 2022-02-11 DIAGNOSIS — S14129D Central cord syndrome at unspecified level of cervical spinal cord, subsequent encounter: Secondary | ICD-10-CM | POA: Diagnosis not present

## 2022-02-11 DIAGNOSIS — Z79899 Other long term (current) drug therapy: Secondary | ICD-10-CM | POA: Diagnosis not present

## 2022-02-11 DIAGNOSIS — I82811 Embolism and thrombosis of superficial veins of right lower extremities: Secondary | ICD-10-CM | POA: Diagnosis not present

## 2022-02-11 DIAGNOSIS — M19041 Primary osteoarthritis, right hand: Secondary | ICD-10-CM | POA: Diagnosis present

## 2022-02-11 DIAGNOSIS — Z888 Allergy status to other drugs, medicaments and biological substances status: Secondary | ICD-10-CM

## 2022-02-11 DIAGNOSIS — E876 Hypokalemia: Secondary | ICD-10-CM | POA: Diagnosis not present

## 2022-02-11 DIAGNOSIS — S12300D Unspecified displaced fracture of fourth cervical vertebra, subsequent encounter for fracture with routine healing: Secondary | ICD-10-CM

## 2022-02-11 DIAGNOSIS — M7989 Other specified soft tissue disorders: Secondary | ICD-10-CM | POA: Diagnosis not present

## 2022-02-11 DIAGNOSIS — R682 Dry mouth, unspecified: Secondary | ICD-10-CM | POA: Diagnosis not present

## 2022-02-11 DIAGNOSIS — N319 Neuromuscular dysfunction of bladder, unspecified: Secondary | ICD-10-CM | POA: Diagnosis not present

## 2022-02-11 DIAGNOSIS — Z90710 Acquired absence of both cervix and uterus: Secondary | ICD-10-CM | POA: Diagnosis not present

## 2022-02-11 DIAGNOSIS — Z981 Arthrodesis status: Secondary | ICD-10-CM | POA: Diagnosis not present

## 2022-02-11 DIAGNOSIS — Z6833 Body mass index (BMI) 33.0-33.9, adult: Secondary | ICD-10-CM

## 2022-02-11 DIAGNOSIS — K592 Neurogenic bowel, not elsewhere classified: Secondary | ICD-10-CM | POA: Diagnosis not present

## 2022-02-11 DIAGNOSIS — M79601 Pain in right arm: Secondary | ICD-10-CM | POA: Diagnosis not present

## 2022-02-11 DIAGNOSIS — R600 Localized edema: Secondary | ICD-10-CM | POA: Diagnosis not present

## 2022-02-11 DIAGNOSIS — L24A0 Irritant contact dermatitis due to friction or contact with body fluids, unspecified: Secondary | ICD-10-CM | POA: Diagnosis present

## 2022-02-11 DIAGNOSIS — Z885 Allergy status to narcotic agent status: Secondary | ICD-10-CM | POA: Diagnosis not present

## 2022-02-11 DIAGNOSIS — L8990 Pressure ulcer of unspecified site, unspecified stage: Secondary | ICD-10-CM | POA: Insufficient documentation

## 2022-02-11 LAB — CBC
HCT: 31.2 % — ABNORMAL LOW (ref 36.0–46.0)
Hemoglobin: 9.5 g/dL — ABNORMAL LOW (ref 12.0–15.0)
MCH: 27.7 pg (ref 26.0–34.0)
MCHC: 30.4 g/dL (ref 30.0–36.0)
MCV: 91 fL (ref 80.0–100.0)
Platelets: 325 10*3/uL (ref 150–400)
RBC: 3.43 MIL/uL — ABNORMAL LOW (ref 3.87–5.11)
RDW: 13.5 % (ref 11.5–15.5)
WBC: 6.6 10*3/uL (ref 4.0–10.5)
nRBC: 0 % (ref 0.0–0.2)

## 2022-02-11 LAB — CREATININE, SERUM
Creatinine, Ser: 0.77 mg/dL (ref 0.44–1.00)
GFR, Estimated: >60 mL/min (ref >60)

## 2022-02-11 MED ORDER — OMEGA-3-ACID ETHYL ESTERS 1 G PO CAPS
1.0000 g | ORAL_CAPSULE | Freq: Two times a day (BID) | ORAL | Status: DC
  Administered 2022-02-11 – 2022-02-25 (×28): 1 g via ORAL
  Filled 2022-02-11 (×28): qty 1

## 2022-02-11 MED ORDER — LIP MEDEX EX OINT: TOPICAL_OINTMENT | CUTANEOUS | Status: DC | PRN

## 2022-02-11 MED ORDER — GABAPENTIN 100 MG PO CAPS
100.0000 mg | ORAL_CAPSULE | Freq: Two times a day (BID) | ORAL | Status: DC
  Administered 2022-02-11 – 2022-02-25 (×28): 100 mg via ORAL
  Filled 2022-02-11 (×29): qty 1

## 2022-02-11 MED ORDER — BISACODYL 10 MG RE SUPP: 10.0000 mg | Freq: Every day | RECTAL | Status: DC | PRN

## 2022-02-11 MED ORDER — POLYETHYLENE GLYCOL 3350 17 G PO PACK
17.0000 g | PACK | Freq: Every day | ORAL | Status: DC
  Administered 2022-02-12 – 2022-02-23 (×9): 17 g via ORAL
  Filled 2022-02-11 (×14): qty 1

## 2022-02-11 MED ORDER — OXYCODONE HCL 5 MG PO TABS
10.0000 mg | ORAL_TABLET | ORAL | Status: DC | PRN
  Administered 2022-02-18 – 2022-02-19 (×5): 10 mg via ORAL
  Filled 2022-02-11 (×5): qty 2

## 2022-02-11 MED ORDER — DOCUSATE SODIUM 100 MG PO CAPS
100.0000 mg | ORAL_CAPSULE | Freq: Two times a day (BID) | ORAL | Status: DC
  Administered 2022-02-11 – 2022-02-25 (×28): 100 mg via ORAL
  Filled 2022-02-11 (×29): qty 1

## 2022-02-11 MED ORDER — TRAMADOL HCL 50 MG PO TABS: 50.0000 mg | ORAL_TABLET | Freq: Four times a day (QID) | ORAL | Status: DC | PRN

## 2022-02-11 MED ORDER — ACETAMINOPHEN 325 MG PO TABS: 650.0000 mg | ORAL_TABLET | ORAL | Status: DC | PRN

## 2022-02-11 MED ORDER — ACETAMINOPHEN 650 MG RE SUPP: 650.0000 mg | RECTAL | Status: DC | PRN

## 2022-02-11 MED ORDER — ENOXAPARIN SODIUM 40 MG/0.4ML IJ SOSY: 40.0000 mg | PREFILLED_SYRINGE | INTRAMUSCULAR | Status: DC

## 2022-02-11 MED ORDER — ONDANSETRON HCL 4 MG PO TABS: 4.0000 mg | ORAL_TABLET | Freq: Four times a day (QID) | ORAL | Status: DC | PRN

## 2022-02-11 MED ORDER — DICLOFENAC SODIUM 1 % EX GEL
4.0000 g | Freq: Four times a day (QID) | CUTANEOUS | Status: DC | PRN
  Administered 2022-02-13: 4 g via TOPICAL
  Filled 2022-02-11: qty 100

## 2022-02-11 MED ORDER — ONDANSETRON HCL 4 MG/2ML IJ SOLN: 4.0000 mg | Freq: Four times a day (QID) | INTRAMUSCULAR | Status: DC | PRN

## 2022-02-11 MED ORDER — CYCLOBENZAPRINE HCL 5 MG PO TABS: 10.0000 mg | ORAL_TABLET | Freq: Three times a day (TID) | ORAL | Status: DC | PRN

## 2022-02-11 MED ORDER — TRAMADOL HCL 50 MG PO TABS
50.0000 mg | ORAL_TABLET | Freq: Four times a day (QID) | ORAL | Status: DC
  Administered 2022-02-11 – 2022-02-25 (×55): 50 mg via ORAL
  Filled 2022-02-11 (×56): qty 1

## 2022-02-11 MED ORDER — ENOXAPARIN SODIUM 40 MG/0.4ML IJ SOSY
40.0000 mg | PREFILLED_SYRINGE | INTRAMUSCULAR | Status: DC
  Administered 2022-02-12: 40 mg via SUBCUTANEOUS
  Filled 2022-02-11: qty 0.4

## 2022-02-11 NOTE — Progress Notes (Signed)
Inpatient Rehabilitation Admission Medication Review by a Pharmacist  A complete drug regimen review was completed for this patient to identify any potential clinically significant medication issues.  High Risk Drug Classes Is patient taking? Indication by Medication  Antipsychotic No   Anticoagulant Yes Enoxaparin: VTE ppx  Antibiotic No   Opioid Yes Oxycodone, Tramadol: PRN pain  Antiplatelet No   Hypoglycemics/insulin No   Vasoactive Medication Yes Lisinopril: hypertension  Chemotherapy No   Other Yes Docusate, Miralax, Bisacaodyl: constipation Diclofenac gel: PRN topical pain relief Cyclobenzaprine: PRN muscle spasms Gabapentin: neuropathy Otezla: Psoriatic arthritis Ondansetron: PRN nausea/vomiting Kcl: supplement Lovaza: HLD     Type of Medication Issue Identified Description of Issue Recommendation(s)  Drug Interaction(s) (clinically significant)     Duplicate Therapy     Allergy     No Medication Administration End Date     Incorrect Dose     Additional Drug Therapy Needed     Significant med changes from prior encounter (inform family/care partners about these prior to discharge). Discontinued medications: - ibuprofen Communicate with patient/family on discharge from CIR  Other       Clinically significant medication issues were identified that warrant physician communication and completion of prescribed/recommended actions by midnight of the next day:  No   Pharmacist comments: n/a   Time spent performing this drug regimen review (minutes): 20   Thank you for allowing pharmacy to be a part of this patient's care.  Ardyth Harps, PharmD Clinical Pharmacist

## 2022-02-11 NOTE — Progress Notes (Signed)
Inpatient Rehabilitation Admissions Coordinator   I have CIR bed and approval to admit to CIR today. I spoke with Weston Brass, DNP by phone for approval to admit today. I have alerted acute team and TOC and will make the arrangements to admit today.  Danne Baxter, RN, MSN Rehab Admissions Coordinator [phone] 02/11/2022 9:55 AM

## 2022-02-11 NOTE — Progress Notes (Signed)
Physical Therapy Treatment Patient Details Name: Shelby Fleming MRN: [id number] DOB: 31-Aug-1946 Today's Date: 02/11/2022   History of Present Illness Pt is a 76 y/o female who admitted after a mechanical fall at home with altered sensation of BUE. 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 Pt underwent C4 corpectomy and C3-5 anterior fusion with removal of previous plate on 5/36. PMH: prior ACDF, HTN, psoriasis.    PT Comments    Pt received OOB in recliner with continued progress towards acute goals, however pt continues to be limited by overall endurance despite great motivation and participation. Overall pt min guard for transfers and min a for ambulation and bed mobility. Emphasis throughout session on pt performing as much of tranfers and bed mobility without family assist as able with good success. Pt with noted improving coordination with gait on RW with more even steps and fluidity or RW advancement. Answered all pt and family questions re; activity recommendations and CIR. Current plan remains appropriate to address deficits and maximize functional independence and decrease caregiver burden, Pt continues to benefit from skilled PT services to progress toward functional mobility goals.    Recommendations for follow up therapy are one component of a multi-disciplinary discharge planning process, led by the attending physician.  Recommendations may be updated based on patient status, additional functional criteria and insurance authorization.  Follow Up Recommendations  Acute inpatient rehab (3hours/day)     Assistance Recommended at Discharge Frequent or constant Supervision/Assistance  Patient can return home with the following A lot of help with walking and/or transfers;A lot of help with bathing/dressing/bathroom;Assistance with feeding;Assist for transportation;Help with stairs or ramp for  entrance   Equipment Recommendations  Rolling walker (2 wheels);BSC/3in1    Recommendations for Other Services Rehab consult     Precautions / Restrictions Precautions Precautions: Fall;Cervical Precaution Booklet Issued: Yes (comment) Precaution Comments: Reviewed handout and pt was cued for precautions during functional mobility. Required Braces or Orthoses: Cervical Brace Cervical Brace: Hard collar;At all times;Other (comment) Restrictions Weight Bearing Restrictions: No     Mobility  Bed Mobility Overal bed mobility: Needs Assistance Bed Mobility: Sit to Supine       Sit to supine: Min assist, HOB elevated   General bed mobility comments: min assist to bring BLE into bed    Transfers Overall transfer level: Needs assistance Equipment used: Rolling walker (2 wheels) Transfers: Sit to/from Stand, Bed to chair/wheelchair/BSC Sit to Stand: Min guard           General transfer comment: min guard for safety, good hand placement    Ambulation/Gait Ambulation/Gait assistance: Min guard Gait Distance (Feet): 30 Feet Assistive device: Rolling walker (2 wheels) Gait Pattern/deviations: Step-through pattern, Decreased stride length       General Gait Details: improving coordination with fair RW management around obstacles in room   Stairs             Wheelchair Mobility    Modified Rankin (Stroke Patients Only)       Balance Overall balance assessment: Needs assistance Sitting-balance support: Bilateral upper extremity supported, Feet supported Sitting balance-Leahy Scale: Good     Standing balance support: Bilateral upper extremity supported, During functional activity, Reliant on assistive device for balance Standing balance-Leahy Scale: Fair  Cognition Arousal/Alertness: Awake/alert Behavior During Therapy: WFL for tasks assessed/performed Overall Cognitive Status: Impaired/Different from  baseline Area of Impairment: Problem solving, Following commands, Safety/judgement, Awareness                       Following Commands: Follows one step commands with increased time Safety/Judgement: Decreased awareness of safety, Decreased awareness of deficits Awareness: Emergent Problem Solving: Slow processing, Requires verbal cues General Comments: pt motivated for participation, continues to be eager to complete mobility without assist        Exercises      General Comments        Pertinent Vitals/Pain Pain Assessment Pain Assessment: Faces Faces Pain Scale: Hurts a little bit Pain Location: L foot Pain Descriptors / Indicators: Sore Pain Intervention(s): Monitored during session, Limited activity within patient's tolerance    Home Living                          Prior Function            PT Goals (current goals can now be found in the care plan section) Acute Rehab PT Goals Patient Stated Goal: to make coffee again PT Goal Formulation: With patient/family Time For Goal Achievement: 02/17/22    Frequency    Min 5X/week      PT Plan Current plan remains appropriate    Co-evaluation              AM-PAC PT "6 Clicks" Mobility   Outcome Measure  Help needed turning from your back to your side while in a flat bed without using bedrails?: A Lot Help needed moving from lying on your back to sitting on the side of a flat bed without using bedrails?: A Lot Help needed moving to and from a bed to a chair (including a wheelchair)?: A Little Help needed standing up from a chair using your arms (e.g., wheelchair or bedside chair)?: A Little Help needed to walk in hospital room?: A Little Help needed climbing 3-5 steps with a railing? : A Lot 6 Click Score: 15    End of Session Equipment Utilized During Treatment: Gait belt;Cervical collar Activity Tolerance: Patient tolerated treatment well Patient left: in bed;with call bell/phone  within reach;with family/visitor present Nurse Communication: Mobility status PT Visit Diagnosis: Unsteadiness on feet (R26.81);Other symptoms and signs involving the nervous system (W97.989)     Time: 1131-1150 PT Time Calculation (min) (ACUTE ONLY): 19 min  Charges:  $Therapeutic Activity: 8-22 mins                     Benjermin Korber R. PTA Acute Rehabilitation Services Office: [place] 02/11/2022, 12:00 PM

## 2022-02-11 NOTE — Progress Notes (Signed)
Pt arrived to unit via bed from 5N. Patient A&Ox4, pt oriented to unit. Family at bedside. Pt and family express understanding. Pt denies any pain, SOB or chest pain. Needs met, call light within reach.

## 2022-02-11 NOTE — H&P (Signed)
Physical Medicine and Rehabilitation Admission H&P  CC: C4 corpectomy and cervical plate and screws M3-5-3 02/02/2022   HPI: Shelby Fleming is a 76 year old right-handed female with history of hypertension, psoriasis as well as prior history ACDF.  Per chart review patient lives alone.  1 level home with 2 steps to entry.  Independent prior to admission.  Daughter plans to provide assistance as needed on discharge.  Presented 01/31/2022 after mechanical fall.  By report patient tripped and fell into her storm door on her left side with severe dysesthesias to all 4 extremities.  No loss of consciousness.  Denied any syncope associated with the fall.  Cranial CT scan negative.  Bilateral hip and pelvis films negative.  CT cervical spine showed fracture through the C4 vertebral body in the inferior anterior corner at C3.  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  MRI cervical spine again known C4 body and C3 anterior inferior corner fractures.  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  No visible injury to the posterior elements.  C4-5 cord flattening primarily from central disc protrusion.  Milder degenerative spinal stenosis C3-4.  By foraminal impingement at C3-4 and C4-5.  Left foraminal impingement of C7-T1 as well as C5-C7 ACDF.  Underwent C4 corpectomy, anterior interbody technique including discectomies and bilateral foraminotomies.  Placement of corpectomy cage at C4.  Placement of anterior instrumentation consisting of cervical plate and screws I1-4-4 02/02/2022 per Dr. Duffy Rhody.  She was placed in a cervical hard collar and was able to remove for showering.  She was cleared to begin Lovenox for DVT prophylaxis 02/03/2022.  Her diet has been advanced to a regular consistency.  She did have some urinary retention suspect neurogenic bladder initially placed on Flomax since discontinued with lattest bladder scan 451 ml  and refused  intermittent catheters patient considering Foley to.  Therapy evaluations completed due to patient decreased functional mobility was admitted for a comprehensive rehab program. Currently complains of pain in hand joints.   Review of Systems  Constitutional:  Negative for chills and fever.  HENT:  Negative for hearing loss.   Eyes:  Negative for blurred vision and double vision.  Respiratory:  Negative for cough and shortness of breath.   Cardiovascular:  Negative for chest pain, palpitations and leg swelling.  Gastrointestinal:  Positive for constipation. Negative for heartburn, nausea and vomiting.  Genitourinary:  Negative for dysuria, flank pain and hematuria.  Musculoskeletal:  Positive for joint pain and myalgias.  Skin:  Negative for rash.  Neurological:  Positive for sensory change and weakness.  All other systems reviewed and are negative. Past Medical History:  Diagnosis Date   Hypertension    Psoriasis    Past Surgical History:  Procedure Laterality Date   ABDOMINAL HYSTERECTOMY     ANTERIOR CERVICAL DECOMP/DISCECTOMY FUSION N/A 02/02/2022   Procedure: CERVICAL FOUR CORPECTOMY WITH PLATE REMOVAL;  Surgeon: Vallarie Mare, MD;  Location: [facility];  Service: Neurosurgery;  Laterality: N/A;   BACK SURGERY     No family history on file. Social History:  reports that she has never smoked. She has never used smokeless tobacco. She reports that she does not drink alcohol and does not use drugs. Allergies:  Allergies  Allergen Reactions   Atorvastatin Other (See Comments)    Muscle weakness   Codeine Nausea Only   Ezetimibe Other (See Comments)    Muscle weakness   Pitavastatin Other (See  Comments)    Muscle weakness   Pravastatin Other (See Comments)    Weakness    Rosuvastatin Other (See Comments)    Muscle weakness   Medications Prior to Admission  Medication Sig Dispense Refill   Apremilast (OTEZLA) 30 MG TABS Take 30 mg by mouth 2 (two) times daily.     D 1000 25  MCG (1000 UT) capsule Take 1,000 Units by mouth daily.     lisinopril (PRINIVIL,ZESTRIL) 40 MG tablet Take 40 mg by mouth daily.     loratadine (CLARITIN) 10 MG tablet Take 10 mg by mouth daily.     omega-3 acid ethyl esters (LOVAZA) 1 g capsule Take 2 g by mouth 2 (two) times daily.       Physical Exam: Weight 87.2 kg. Gen: no distress, normal appearing HEENT: oral mucosa pink and moist, NCAT, hard cervical collar in place Cardio: Reg rate Chest: normal effort, normal rate of breathing Abd: soft, non-distended Ext: no edema Psych: pleasant, normal affect Skin: Incision site clean and dry.  Neuro:  Patient is alert.  Oriented x3 and follows commands.  Musculoskeletal: Hard cervical collar in place, arthritis of small hand joints. RUE 4/5, LUE 3/5, 4/5 in lower extremities b/l  Results for orders placed or performed during the hospital encounter of 02/11/22 (from the past 48 hour(s))  CBC     Status: Abnormal   Collection Time: 02/11/22  7:39 PM  Result Value Ref Range   WBC 6.6 4.0 - 10.5 K/uL   RBC 3.43 (L) 3.87 - 5.11 MIL/uL   Hemoglobin 9.5 (L) 12.0 - 15.0 g/dL   HCT 31.2 (L) 36.0 - 46.0 %   MCV 91.0 80.0 - 100.0 fL   MCH 27.7 26.0 - 34.0 pg   MCHC 30.4 30.0 - 36.0 g/dL   RDW 13.5 11.5 - 15.5 %   Platelets 325 150 - 400 K/uL   nRBC 0.0 0.0 - 0.2 %    Comment: Performed at [facility], [place] [address], [place], [place] [address]   No results found.    Weight 87.2 kg.  Medical Problem List and Plan: 1. 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  Status post C4 corpectomy including discectomies and bilateral foraminotomies with placement of corpectomy cage C4 02/02/2022.  Cervical collar as directed  -patient may shower  -ELOS/Goals: 5-7 days S  -Admit to CIR 2.  Antithrombotics: -DVT/anticoagulation:  Pharmaceutical: Lovenox check vascular study  -antiplatelet therapy: N/A 3. Postoperative pain: continue Tramadol 50 mg every 6 hours,  Neurontin 100 mg twice daily, Flexeril 10 mg 3 times daily as needed, oxycodone as needed 4. Mood: Provide emotional support  -antipsychotic agents: N/A 5. Neuropsych: This patient is capable of making decisions on her own behalf. 6. Skin/Wound Care: Routine skin checks 7. Fluids/Electrolytes/Nutrition: Routine in and outs with follow-up chemistries 8.  Neurogenic bowel and bladder.  Check PVR.  Patient had initially refused intermittent catheterization.  Establish bowel program 9.  Dysphagia.  Improved.  Diet advanced to regular 10.  Hypertension.  Patient on lisinopril 40 mg daily prior to admission.  Resume as needed 11.  Hyperlipidemia.  Restart Lovaza 2 g twice daily 12.  Psoriasis.  Restart Oteleza 30 mg twice daily 13. Bilateral hand OA: add voltaren gel prn  I have personally performed a face to face diagnostic evaluation, including, but not limited to relevant history and physical exam findings, of this patient and developed relevant assessment and plan.  Additionally, I have reviewed and  concur with the physician assistant's documentation above.  Leeroy Cha, MD, ABPMR   Lavon Paganini Angiulli, PA-C

## 2022-02-11 NOTE — Progress Notes (Signed)
Occupational Therapy Treatment Patient Details Name: Shelby Fleming MRN: [id number] DOB: 09/21/1945 Today's Date: 02/11/2022   History of present illness Pt is a 76 y/o female who admitted after a mechanical fall at home with altered sensation of BUE. 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 Pt underwent C4 corpectomy and C3-5 anterior fusion with removal of previous plate on 1/01. PMH: prior ACDF, HTN, psoriasis.   OT comments  Pt eager to participate and making excellent progress towards OT goals. Emphasis on compensatory/adaptive strategies for ADLs, dynamic standing balance and fine motor coordination exercises. Overall, pt Min A for transfers using RW, Mod A for toileting tasks and Min A for oral care today. Pt would benefit from AE education for LB ADLs in next session, as well as further UE strengthening/coordination to improve use of R dominant UE. Continue to rec AIR at DC as pt has excellent rehab potential.    Recommendations for follow up therapy are one component of a multi-disciplinary discharge planning process, led by the attending physician.  Recommendations may be updated based on patient status, additional functional criteria and insurance authorization.    Follow Up Recommendations  Acute inpatient rehab (3hours/day)    Assistance Recommended at Discharge Frequent or constant Supervision/Assistance  Patient can return home with the following  A lot of help with bathing/dressing/bathroom;Assistance with cooking/housework;Assist for transportation;Help with stairs or ramp for entrance;A little help with walking and/or transfers   Equipment Recommendations  BSC/3in1;Other (comment)    Recommendations for Other Services Rehab consult    Precautions / Restrictions Precautions Precautions: Fall;Cervical Precaution Booklet Issued: Yes (comment) Precaution Comments: Reviewed  handout and pt was cued for precautions during functional mobility. Required Braces or Orthoses: Cervical Brace Cervical Brace: Hard collar;At all times;Other (comment) Restrictions Weight Bearing Restrictions: No       Mobility Bed Mobility Overal bed mobility: Needs Assistance Bed Mobility: Supine to Sit     Supine to sit: Min guard, HOB elevated     General bed mobility comments: minor cues for sequencing but able to complete without assist    Transfers Overall transfer level: Needs assistance Equipment used: Rolling walker (2 wheels) Transfers: Sit to/from Stand, Bed to chair/wheelchair/BSC Sit to Stand: Min guard     Step pivot transfers: Min assist     General transfer comment: improving coordination in stepping to [facility] and recliner with shoes donned - light assist for balance/RW manuvering     Balance Overall balance assessment: Needs assistance Sitting-balance support: Bilateral upper extremity supported, Feet supported Sitting balance-Leahy Scale: Good     Standing balance support: Bilateral upper extremity supported, During functional activity, Reliant on assistive device for balance Standing balance-Leahy Scale: Fair                             ADL either performed or assessed with clinical judgement   ADL Overall ADL's : Needs assistance/impaired     Grooming: Minimal assistance;Sitting;Oral care Grooming Details (indicate cue type and reason): use of foam grips on toothbrush, assist to open toothpaste but pt able to place on toothbrush today. used R UE mainly with L hand providing supportive assist             Lower Body Dressing: Maximal assistance;Sit to/from stand Lower Body Dressing Details (indicate cue type and reason): problem solving shoe mgmt, pt able to guide  toes into shoes and OT acted as shoehorn to assist in getting around heel. Would benefit from AE education in next session Toilet Transfer: Minimal  assistance;Stand-pivot;Rolling walker (2 wheels) Toilet Transfer Details (indicate cue type and reason): light RW assist and cues for posture/balance correction Toileting- Clothing Manipulation and Hygiene: Moderate assistance;Sitting/lateral lean;Sit to/from stand Toileting - Clothing Manipulation Details (indicate cue type and reason): problem solving strategies to reach for peri care with difficulty reaching posterior region due to impaired R UE strength/reach, able to wipte anterior region and alternate UE support on RW for brief mgmt. assist for posterior hygiene and light assist for brief mgmt       General ADL Comments: Emphasis on compensatory and adaptive strategies during ADLs    Extremity/Trunk Assessment Upper Extremity Assessment Upper Extremity Assessment: LUE deficits/detail;RUE deficits/detail RUE Deficits / Details: Edema to hand. Limited shoulder AROM with upper trap compensation noted. difficulty with hand to mouth but improving and use of L UE to assist RUE Sensation: decreased light touch RUE Coordination: decreased fine motor LUE Deficits / Details: Edema to hand. AROM of shoulder to ~70 degrees in chair. LUE Sensation: decreased light touch LUE Coordination: decreased fine motor   Lower Extremity Assessment Lower Extremity Assessment: Defer to PT evaluation        Vision   Vision Assessment?: No apparent visual deficits   Perception     Praxis      Cognition Arousal/Alertness: Awake/alert Behavior During Therapy: WFL for tasks assessed/performed Overall Cognitive Status: Impaired/Different from baseline Area of Impairment: Problem solving, Following commands, Safety/judgement, Awareness                       Following Commands: Follows one step commands with increased time Safety/Judgement: Decreased awareness of safety, Decreased awareness of deficits Awareness: Emergent Problem Solving: Slow processing, Requires verbal cues General  Comments: pt without visitors in room, very pleasant, participatory and eager to attempt tasks without assist        Exercises Exercises: Other exercises Other Exercises Other Exercises: yellow theraputty; rolling into ball, "snake" and pinching (provided handout but unable to locate today)    Shoulder Instructions       General Comments      Pertinent Vitals/ Pain       Pain Assessment Pain Assessment: Faces Faces Pain Scale: Hurts a little bit Pain Location: L foot Pain Descriptors / Indicators: Sore Pain Intervention(s): Monitored during session  Home Living                                          Prior Functioning/Environment              Frequency  Min 3X/week        Progress Toward Goals  OT Goals(current goals can now be found in the care plan section)  Progress towards OT goals: Progressing toward goals  Acute Rehab OT Goals Patient Stated Goal: regain independence, get back to church OT Goal Formulation: With patient/family Time For Goal Achievement: 02/17/22 Potential to Achieve Goals: Good ADL Goals Pt Will Perform Grooming: with set-up;standing Pt Will Perform Lower Body Bathing: with set-up;sit to/from stand;sitting/lateral leans Pt Will Transfer to Toilet: with supervision;ambulating  Plan Discharge plan remains appropriate    Co-evaluation                 AM-PAC OT "6 Clicks" Daily  Activity     Outcome Measure   Help from another person eating meals?: A Little Help from another person taking care of personal grooming?: A Little Help from another person toileting, which includes using toliet, bedpan, or urinal?: A Lot Help from another person bathing (including washing, rinsing, drying)?: A Lot Help from another person to put on and taking off regular upper body clothing?: A Little Help from another person to put on and taking off regular lower body clothing?: A Lot 6 Click Score: 15    End of Session  Equipment Utilized During Treatment: Gait belt;Rolling walker (2 wheels);Cervical collar  OT Visit Diagnosis: Unsteadiness on feet (R26.81);Other abnormalities of gait and mobility (R26.89);Muscle weakness (generalized) (M62.81)   Activity Tolerance Patient tolerated treatment well   Patient Left in chair;with call bell/phone within reach   Nurse Communication Mobility status        Time: 6063-0160 OT Time Calculation (min): 45 min  Charges: OT General Charges $OT Visit: 1 Visit OT Treatments $Self Care/Home Management : 23-37 mins $Therapeutic Activity: 8-22 mins  Malachy Chamber, OTR/L Acute Rehab Services Office: [phone]   Shelby Fleming 02/11/2022, 10:45 AM

## 2022-02-11 NOTE — Plan of Care (Signed)
  Problem: Health Behavior/Discharge Planning: Goal: Ability to manage health-related needs will improve Outcome: Completed/Met   Problem: Clinical Measurements: Goal: Ability to maintain clinical measurements within normal limits will improve Outcome: Completed/Met Goal: Will remain free from infection Outcome: Completed/Met Goal: Diagnostic test results will improve Outcome: Completed/Met Goal: Respiratory complications will improve Outcome: Completed/Met Goal: Cardiovascular complication will be avoided Outcome: Completed/Met   Problem: Activity: Goal: Risk for activity intolerance will decrease Outcome: Completed/Met   Problem: Nutrition: Goal: Adequate nutrition will be maintained Outcome: Completed/Met   Problem: Coping: Goal: Level of anxiety will decrease Outcome: Completed/Met   Problem: Elimination: Goal: Will not experience complications related to bowel motility Outcome: Completed/Met Goal: Will not experience complications related to urinary retention Outcome: Completed/Met   Problem: Pain Managment: Goal: General experience of comfort will improve Outcome: Completed/Met   Problem: Safety: Goal: Ability to remain free from injury will improve Outcome: Completed/Met   Problem: Skin Integrity: Goal: Risk for impaired skin integrity will decrease Outcome: Completed/Met   Problem: Acute Rehab OT Goals (only OT should resolve) Goal: Pt. Will Perform Grooming Outcome: Completed/Met Goal: Pt. Will Perform Lower Body Bathing Outcome: Completed/Met Goal: Pt. Will Transfer To Toilet Outcome: Completed/Met   Problem: Acute Rehab PT Goals(only PT should resolve) Goal: Pt Will Go Supine/Side To Sit Outcome: Completed/Met Goal: Patient Will Transfer Sit To/From Stand Outcome: Completed/Met Goal: Pt Will Ambulate Outcome: Completed/Met Goal: Pt Will Go Up/Down Stairs Outcome: Completed/Met

## 2022-02-11 NOTE — Progress Notes (Signed)
Mobility Specialist Progress Note   02/11/22 1300  Mobility  Activity Turned to back - supine  Level of Assistance Standby assist, set-up cues, supervision of patient - no hands on  Assistive Device None  Activity Response Tolerated well  $Mobility charge 1 Mobility   Received pt in bed c/o being in an uncomfortable position, assisted in repositioning pt and then participated in bed level UE exercises. RUE presenting w/ restricted movement and slight pain but otherwise performed exercises well. Left in supine w/ call bell in reach and needs met.  Holland Falling Mobility Specialist Phone Number [phone]

## 2022-02-11 NOTE — Care Management Important Message (Signed)
Important Message  Patient Details  Name: Shelby Fleming MRN: [id number] Date of Birth: 1946-07-28   Medicare Important Message Given:  Yes     Orbie Pyo 02/11/2022, 3:49 PM

## 2022-02-11 NOTE — Discharge Summary (Signed)
Physician Discharge Summary  Patient ID: Shelby Fleming MRN: [id number] DOB/AGE: 10-02-1945 76 y.o.  Admit date: 01/31/2022 Discharge date: 02/11/2022  Admission Diagnoses: C4 fracture with spinal cord injury  Discharge Diagnoses: C4 fracture with spinal cord injury Principal Problem:   C4 cervical fracture ([name] Active Problems:   Closed cervical spine fracture [name])   Discharged Condition: good  Hospital Course: The patient was admitted on 02/01/2022 after suffering a fall where she had temporary paresis and then severe dysesthesias in her bilateral upper extremities that was persistent.  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  She was taken to the operating room on 02/02/2022 where the patient underwent C4 corpectomy with C3-5 anterior fusion due to C4 fracture with spinal cord injury. The patient tolerated the procedure well and was taken to the recovery room and then to the floor in stable condition. The hospital course was routine. There were no complications. The wound remained clean dry and intact. Pt had appropriate upper back and incisional soreness. No complaints of leg pain or new N/T/W. The patient remained afebrile with stable vital signs, and tolerated a regular diet. The patient continued to increase activities, and pain was well controlled with oral pain medications. Therapy recommended CIR. She is now medically stable and ready to begin CIR.    Consults: None  Significant Diagnostic Studies: radiology: X-Ray: left hand, left knee, bilateral hips, intraoperative, MRI: cervical, and CT scan: head, cervical  Treatments: therapies: PT and OT and surgery:  1. C4 corpectomy, anterior interbody technique, including discectomies and bilateral foraminotomies  2. Placement of Corpectomy cage C4 3 . Placement of anterior instrumentation consisting of cervical plate and screws W4-0-9 6. Use of morselized  bone allograft  7. Use of intraoperative microscope  Discharge Exam: Blood pressure (!) 123/59, pulse 90, temperature 98.9 F (37.2 C), temperature source Oral, resp. rate 19, height '5\' 4"'$  (1.626 m), weight 89.1 kg, SpO2 95 %. Physical Exam: Patient is A/O X 4. Speech is fluent and appropriate. She is in good spirits and conversant. MAEW. BUE 4-/5 except for grip strength 3/5 on the left and 4-/5 on the right. BLE 4+/5 throughout. Sensation to light touch is intact. PERLA, EOMI. CNs grossly intact. Dressing is clean dry intact. Incision is well approximated with no drainage, erythema, or edema. Hard cervical collar in place.  Disposition: Discharge disposition: [place] Not Defined       Discharge Instructions     Incentive spirometry RT   Complete by: As directed       Allergies as of 02/11/2022       Reactions   Atorvastatin Other (See Comments)   Muscle weakness   Codeine Nausea Only   Ezetimibe Other (See Comments)   Muscle weakness   Pitavastatin Other (See Comments)   Muscle weakness   Pravastatin Other (See Comments)   Weakness    Rosuvastatin Other (See Comments)   Muscle weakness        Medication List     STOP taking these medications    ibuprofen 200 MG tablet Commonly known as: ADVIL       TAKE these medications    D 1000 25 MCG (1000 UT) capsule Generic drug: Cholecalciferol Take 1,000 Units by mouth daily.   lisinopril 40 MG tablet Commonly known as: ZESTRIL Take 40 mg by mouth daily.   loratadine 10 MG tablet Commonly known  as: CLARITIN Take 10 mg by mouth daily.   omega-3 acid ethyl esters 1 g capsule Commonly known as: LOVAZA Take 2 g by mouth 2 (two) times daily.   Otezla 30 MG Tabs Generic drug: Apremilast Take 30 mg by mouth 2 (two) times daily.         Signed: Marvis Moeller, DNP, AGNP-C Neurosurgery Nurse Practitioner  [facility] [place] [address]  [address], [place], [place] [address] P: [phone]    F: [phone]  02/11/2022 9:45 AM

## 2022-02-11 NOTE — Progress Notes (Signed)
Shelby Ribas, MD  Physician Physical Therapy PMR Pre-admission     Signed Date of Service:  02/10/2022 12:14 PM  Related encounter: ED to Hosp-Admission (Current) from 01/31/2022 in [place]      Show:Clear all _0 Written_1 Templated_2 Copied  Added by: _3 Cristina Gong, RN_4 Nelly Laurence, PT_5 Raulkar, Clide Deutscher, MD_6 Michel Santee, PT  _7 Hover for details                                                                                                                                                                                                                                                                                                                                                                                                                                              PMR Admission Coordinator Pre-Admission Assessment   Patient: Shelby Fleming is an 76 y.o., female MRN: [id number] DOB: [birth year] Height: _8  (162.6 cm) Weight: 89.1 kg   Insurance Information HMO: yes    PPO:      PCP:      IPA:      80/20:      OTHER:  PRIMARY: [employer] [facility]      Policy#: [id number]      Subscriber: patient CM Name: Verdie Drown      Phone#: [phone]     Fax#: [phone] Pre-Cert#: [id number] auth for CIR from  Jocelyn at [place] with updates due to fax listed above on 6/17      Employer:  Benefits:  Phone #: [phone]     Name:  Eff. Date: 09/07/2021     Deduct: $0      Out of Pocket Max: $3,600 ( $100 met)      Life Max:  CIR: $295/day days 1-5, $0/day for days 6+      SNF: $0 per day days 1-20 Outpatient: $20/visit     Co-Pay: $20 Home Health: 100%      Co-Pay:  DME: 80%     Co-Pay: 20%  SECONDARY:       Policy#:      Phone#:    Development worker, community:       Phone#:    The Therapist, occupational Information Summary" for patients in Inpatient Rehabilitation Facilities with attached "Privacy Act [place] Records" was provided and verbally reviewed with: Patient and Family   Emergency Contact Information Contact Information       Name Relation Home Work Mobile    [place],Cyrstal Daughter [phone]               Current Medical History  Patient Admitting Diagnosis: C4 central cord syndrome   History of Present Illness: Shelby Fleming is a 76 year old right-handed female with history of hypertension, psoriasis as well as prior history ACDF. Presented 01/31/2022 after mechanical fall.  By report, patient tripped and fell into her storm door on her left side with severe dysesthesias to all 4 extremities.  No loss of consciousness.  Denied any syncope associated with the fall.  Cranial CT scan negative.  Bilateral hip and pelvis films negative.  CT cervical spine showed fracture through the C4 vertebral body in the inferior anterior corner at C3.  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  MRI cervical spine again known C4 body and C3 anterior inferior corner fractures.  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  No visible injury to the posterior elements.  C4-5 cord flattening primarily from central disc protrusion.  Milder degenerative spinal stenosis C3-4.  By foraminal impingement at C3-4 and C4-5.  Left foraminal impingement of C7-T1 as well as C5-C7 ACDF.  Underwent C4 corpectomy, anterior interbody technique including discectomies and bilateral foraminotomies.  Placement of corpectomy cage at C4.  Placement of anterior instrumentation consisting of cervical plate and screws E9-9-3 02/02/2022 per Dr. Duffy Rhody.  She was placed in a cervical hard collar and was able to remove for showering.  She was cleared to begin Lovenox for DVT prophylaxis 02/03/2022.  Her diet has been advanced to a regular  consistency.  She did have some urinary retention, suspect neurogenic bladder, initially placed on Flomax since discontinued with lattest bladder scan 451 ml and refused intermittent catheters patient considering Foley.  Therapy evaluations completed and pt was recommended for a comprehensive rehab program.   Patient's medical record from [facility] has been reviewed by the rehabilitation admission coordinator and physician.   Past Medical History      Past Medical History:  Diagnosis Date   Hypertension     Psoriasis      Has the patient had major surgery during 100 days prior to admission? Yes   Family History   family history is not on file.   Current Medications   Current Facility-Administered Medications:    0.9 %  sodium chloride infusion, 250 mL, Intravenous, Continuous, Vallarie Mare,  MD   acetaminophen (TYLENOL) tablet 650 mg, 650 mg, Oral, Q4H PRN, 650 mg at 02/06/22 0809 **OR** acetaminophen (TYLENOL) suppository 650 mg, 650 mg, Rectal, Q4H PRN, Vallarie Mare, MD   bisacodyl (DULCOLAX) suppository 10 mg, 10 mg, Rectal, Daily PRN, Marvis Moeller, NP   Chlorhexidine Gluconate Cloth 2 % PADS 6 each, 6 each, Topical, Daily, Fenton Malling L, NP, 6 each at 02/09/22 1035   cyclobenzaprine (FLEXERIL) tablet 10 mg, 10 mg, Oral, TID PRN, Vallarie Mare, MD, 10 mg at 02/08/22 1559   diphenhydrAMINE (BENADRYL) capsule 25 mg, 25 mg, Oral, Q6H PRN, Vallarie Mare, MD, 25 mg at 02/03/22 0248   docusate sodium (COLACE) capsule 100 mg, 100 mg, Oral, BID, Vallarie Mare, MD, 100 mg at 02/06/22 0809   enoxaparin (LOVENOX) injection 40 mg, 40 mg, Subcutaneous, Q24H, Vallarie Mare, MD, 40 mg at 02/11/22 0737   gabapentin (NEURONTIN) capsule 100 mg, 100 mg, Oral, BID, Fenton Malling L, NP, 100 mg at 02/11/22 1062   HYDROmorphone (DILAUDID) injection 0.5 mg, 0.5 mg, Intravenous, Q2H PRN, Marvis Moeller, NP   lip balm (CARMEX) ointment, , Topical, PRN,  Judith Part, MD   menthol-cetylpyridinium (CEPACOL) lozenge 3 mg, 1 lozenge, Oral, PRN **OR** phenol (CHLORASEPTIC) mouth spray 1 spray, 1 spray, Mouth/Throat, PRN, Vallarie Mare, MD   ondansetron [facility]) tablet 4 mg, 4 mg, Oral, Q6H PRN **OR** ondansetron (ZOFRAN) injection 4 mg, 4 mg, Intravenous, Q6H PRN, Vallarie Mare, MD   oxyCODONE (Oxy IR/ROXICODONE) immediate release tablet 10 mg, 10 mg, Oral, Q3H PRN, Vallarie Mare, MD   polyethylene glycol (MIRALAX / GLYCOLAX) packet 17 g, 17 g, Oral, Daily, Fenton Malling L, NP, 17 g at 02/06/22 0809   sodium chloride flush (NS) 0.9 % injection 3 mL, 3 mL, Intravenous, Q12H, Vallarie Mare, MD, 3 mL at 02/10/22 0924   sodium chloride flush (NS) 0.9 % injection 3 mL, 3 mL, Intravenous, PRN, Vallarie Mare, MD   sodium phosphate (FLEET) 7-19 GM/118ML enema 1 enema, 1 enema, Rectal, Once PRN, Vallarie Mare, MD   traMADol Veatrice Bourbon) tablet 50 mg, 50 mg, Oral, Q6H, Fenton Malling L, NP, 50 mg at 02/11/22 6948   traMADol (ULTRAM) tablet 50-100 mg, 50-100 mg, Oral, Q6H PRN, Marvis Moeller, NP   Patients Current Diet:  Diet Order                  Diet regular Room service appropriate? Yes; Fluid consistency: Thin  Diet effective now                       Precautions / Restrictions Precautions Precautions: Fall, Cervical Precaution Booklet Issued: Yes (comment) Precaution Comments: Reviewed handout and pt was cued for precautions during functional mobility. Cervical Brace: Hard collar, At all times, Other (comment) Restrictions Weight Bearing Restrictions: No    Has the patient had 2 or more falls or a fall with injury in the past year? Yes   Prior Activity Level Community (5-7x/wk): Patient independent without AD, driving, independent with ADLs   Prior Functional Level Self Care: Did the patient need help bathing, dressing, using the toilet or eating? Independent   Indoor Mobility: Did the patient  need assistance with walking from room to room (with or without device)? Independent   Stairs: Did the patient need assistance with internal or external stairs (with or without device)? Independent   Functional Cognition: Did the patient need  help planning regular tasks such as shopping or remembering to take medications? Independent   Patient Information Are you of Hispanic, Latino/a,or Spanish origin?: A. No, not of Hispanic, Latino/a, or Spanish origin What is your race?: A. White Do you need or want an interpreter to communicate with a doctor or health care staff?: 0. No   Patient's Response To:  Health Literacy and Transportation Is the patient able to respond to health literacy and transportation needs?: Yes Health Literacy - How often do you need to have someone help you when you read instructions, pamphlets, or other written material from your doctor or pharmacy?: Never In the past 12 months, has lack of transportation kept you from medical appointments or from getting medications?: No In the past 12 months, has lack of transportation kept you from meetings, work, or from getting things needed for daily living?: No   Development worker, international aid / [place] Devices/Equipment: None Home Equipment: None   Prior Device Use: Indicate devices/aids used by the patient prior to current illness, exacerbation or injury? None of the above   Current Functional Level Cognition   Overall Cognitive Status: Impaired/Different from baseline Current Attention Level: Selective Orientation Level: Oriented X4 Following Commands: Follows one step commands with increased time Safety/Judgement: Decreased awareness of safety, Decreased awareness of deficits General Comments: Daughter and son in law present and very supportive of pt, but unable to get much a sense for pt's cognition this visit due to family being very involved and often answering for pt.    Extremity Assessment (includes  Sensation/Coordination)   Upper Extremity Assessment: RUE deficits/detail RUE Deficits / Details: Edema to hand. Limited shoulder AROM with upper trap compensation noted. See exercises. RUE Sensation: decreased light touch RUE Coordination: decreased fine motor LUE Deficits / Details: Edema to hand. AROM of shoulder to ~70 degrees in chair. LUE Sensation: decreased light touch LUE Coordination: decreased fine motor  Lower Extremity Assessment: Defer to PT evaluation RLE Sensation: WNL RLE Coordination: WNL LLE Sensation: WNL LLE Coordination: WNL     ADLs   Overall ADL's : Needs assistance/impaired Eating/Feeding: Set up, Sitting Grooming: Minimal assistance, Sitting, Oral care, Wash/dry face Grooming Details (indicate cue type and reason): able to hold regular toothbrush though did drop once during task due to sensation/coordination deficits. unable to open toothpaste without assist and place on toothbrush. daughter jumping in to assist with rinse/spit cups Upper Body Bathing: Moderate assistance, Sitting Lower Body Bathing: Maximal assistance, Sit to/from stand Upper Body Dressing : Moderate assistance, Sitting Lower Body Dressing: Maximal assistance, Sit to/from stand Lower Body Dressing Details (indicate cue type and reason): to don shoes Toilet Transfer: Minimal assistance, Stand-pivot, Rolling walker (2 wheels) Toilet Transfer Details (indicate cue type and reason): Pt stood from recliner with cues for hand placement and Min As. Pt pivoted to EOB taking ~5 lateral steps with RW. Min As and cues for UE reach back to control descent with Min As Toileting- Water quality scientist and Hygiene: Maximal assistance, Sit to/from stand Toileting - Clothing Manipulation Details (indicate cue type and reason): assist for peri care in standing Functional mobility during ADLs: Minimal assistance, Rolling walker (2 wheels) General ADL Comments: Continued emphasis on gait belt use, coordination  with smaller ADL items and when to use foam grips. educated on collar mgmt, removing pads for washing and placing paper towels/washcloths to minimize soilage on collar     Mobility   Overal bed mobility: Needs Assistance Bed Mobility: Rolling, Sidelying to Sit Rolling:  Mod assist Sidelying to sit: Min assist, HOB elevated Supine to sit: Mod assist Sit to supine: Min assist Sit to sidelying: Mod assist, +2 for physical assistance General bed mobility comments: pt OOB on arrival     Transfers   Overall transfer level: Needs assistance Equipment used: Rolling walker (2 wheels) Transfers: Sit to/from Stand Sit to Stand: Min assist, Min guard Bed to/from chair/wheelchair/BSC transfer type:: Step pivot Step pivot transfers: Min assist, From elevated surface General transfer comment: VCs for hand placement, min assist down to min guard with practice     Ambulation / Gait / Stairs / Wheelchair Mobility   Ambulation/Gait Ambulation/Gait assistance: Min assist, +2 safety/equipment Gait Distance (Feet): 200 Feet (x2) Assistive device: Rolling walker (2 wheels) Gait Pattern/deviations: Step-through pattern, Decreased stride length, Shuffle, Knee flexed in stance - right, Knee flexed in stance - left General Gait Details: slow guarded gait with cues fro RW proximity as pt getting very close to front rail, cues for posture and to maintain positiion in center of RW as pt with forward flexion and drfiting to L of center in RW Gait velocity: variable Gait velocity interpretation: 1.31 - 2.62 ft/sec, indicative of limited community ambulator Pre-gait activities: stepping forward at bedside with focus on heel strike for gait. Pt with difficulty understanding and completing task.     Posture / Balance Dynamic Sitting Balance Sitting balance - Comments: posterior lean with fatigue. VCs to maintain upright posture during seated therex Balance Overall balance assessment: Needs assistance Sitting-balance  support: Bilateral upper extremity supported, Feet supported Sitting balance-Leahy Scale: Good Sitting balance - Comments: posterior lean with fatigue. VCs to maintain upright posture during seated therex Postural control: Posterior lean Standing balance support: Bilateral upper extremity supported, During functional activity, Reliant on assistive device for balance Standing balance-Leahy Scale: Fair Standing balance comment: reliance on RW     Special needs/care consideration Skin patient has neck surgical incision    Previous Home Environment  Living Arrangements: Alone Available Help at Discharge: Family, Available 24 hours/day Type of Home: House Home Layout: One level Home Access: Stairs to enter Entrance Stairs-Rails: None Entrance Stairs-Number of Steps: 2 at front without rails; 4 at deck that has rails Bathroom Shower/Tub: Multimedia programmer: Standard Home Care Services: No Additional Comments: daughter plans to stay with pt   Discharge Living Setting Plans for Discharge Living Setting: Patient's home, Lives with (comment) (daughter planning to provide assistance when patient returns home) Type of Home at Discharge: House Discharge Home Layout: One level Discharge Home Access: Stairs to enter Entrance Stairs-Rails: None Entrance Stairs-Number of Steps: 2 Discharge Bathroom Shower/Tub: Walk-in shower Discharge Bathroom Toilet: Standard Discharge Bathroom Accessibility: Yes Does the patient have any problems obtaining your medications?: No   Social/Family/Support Systems Anticipated Caregiver: Daughter, Teacher, music [place] Anticipated Ambulance person Information: [phone] Caregiver Availability: 24/7 Discharge Plan Discussed with Primary Caregiver: Yes Is Caregiver In Agreement with Plan?: Yes Does Caregiver/Family have Issues with Lodging/Transportation while Pt is in Rehab?: No   Goals Patient/Family Goal for Rehab: PT/OT/SLP supervision to  mod I Expected length of stay: 6-9 days Pt/Family Agrees to Admission and willing to participate: Yes Program Orientation Provided & Reviewed with Pt/Caregiver Including Roles  & Responsibilities: Yes   Decrease burden of Care through IP rehab admission: N/A   Possible need for SNF placement upon discharge: not anticipated   Patient Condition: I have reviewed medical records from [facility], spoken with  [facility] team , and patient and daughter. I met  with patient at the bedside for inpatient rehabilitation assessment.  Patient will benefit from ongoing PT and OT, can actively participate in 3 hours of therapy a day 5 days of the week, and can make measurable gains during the admission.  Patient will also benefit from the coordinated team approach during an Inpatient Acute Rehabilitation admission.  The patient will receive intensive therapy as well as Rehabilitation physician, nursing, social worker, and care management interventions.  Due to bladder management, bowel management, safety, skin/wound care, disease management, medication administration, pain management, and patient education the patient requires 24 hour a day rehabilitation nursing.  The patient is currently min to mod assist overall with mobility and basic ADLs.  Discharge setting and therapy post discharge at home with home health is anticipated.  Patient has agreed to participate in the Acute Inpatient Rehabilitation Program and will admit today.   Preadmission Screen Completed By: Shann Medal, PT, DPT and Cleatrice Burke, 02/11/2022 10:21 AM ______________________________________________________________________   Discussed status with Dr. Ranell Patrick on 02/11/22 at 1021 and received approval for admission today.   Admission Coordinator: Shann Medal , PT, DPT with updates by Cleatrice Burke, RN, time  1021 Date 02/11/22    Assessment/Plan: Diagnosis: s/p C4 corpectomy Does the need for close, 24 hr/day Medical supervision  in concert with the patient's rehab needs make it unreasonable for this patient to be served in a less intensive setting? Yes Co-Morbidities requiring supervision/potential complications: HTN, psoriasis, arthritis, history of ACDF, neurogenic bladder Due to bladder management, bowel management, safety, skin/wound care, disease management, medication administration, pain management, and patient education, does the patient require 24 hr/day rehab nursing? Yes Does the patient require coordinated care of a physician, rehab nurse, PT, OT, and SLP to address physical and functional deficits in the context of the above medical diagnosis(es)? Yes Addressing deficits in the following areas: balance, endurance, locomotion, strength, transferring, bowel/bladder control, bathing, dressing, feeding, grooming, toileting, and psychosocial support Can the patient actively participate in an intensive therapy program of at least 3 hrs of therapy 5 days a week? Yes The potential for patient to make measurable gains while on inpatient rehab is excellent Anticipated functional outcomes upon discharge from inpatient rehab: modified independent PT, modified independent OT, independent SLP Estimated rehab length of stay to reach the above functional goals is: 5-7 days Anticipated discharge destination: Home 10. Overall Rehab/Functional Prognosis: excellent     MD Signature: Leeroy Cha, MD         Revision History                                    Note Details  Author Shelby Ribas, MD File Time 02/11/2022 10:25 AM  Author Type Physician Status Signed  Last Editor Shelby Ribas, MD Service Physical Therapy

## 2022-02-11 NOTE — H&P (Signed)
Physical Medicine and Rehabilitation Admission H&P  CC: C4 corpectomy and cervical plate and screws O8-4-1 02/02/2022   HPI: Shelby Fleming is a 76 year old right-handed female with history of hypertension, psoriasis as well as prior history ACDF.  Per chart review patient lives alone.  1 level home with 2 steps to entry.  Independent prior to admission.  Daughter plans to provide assistance as needed on discharge.  Presented 01/31/2022 after mechanical fall.  By report patient tripped and fell into her storm door on her left side with severe dysesthesias to all 4 extremities.  No loss of consciousness.  Denied any syncope associated with the fall.  Cranial CT scan negative.  Bilateral hip and pelvis films negative.  CT cervical spine showed fracture through the C4 vertebral body in the inferior anterior corner at C3.  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  MRI cervical spine again known C4 body and C3 anterior inferior corner fractures.  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  No visible injury to the posterior elements.  C4-5 cord flattening primarily from central disc protrusion.  Milder degenerative spinal stenosis C3-4.  By foraminal impingement at C3-4 and C4-5.  Left foraminal impingement of C7-T1 as well as C5-C7 ACDF.  Underwent C4 corpectomy, anterior interbody technique including discectomies and bilateral foraminotomies.  Placement of corpectomy cage at C4.  Placement of anterior instrumentation consisting of cervical plate and screws Y6-0-6 02/02/2022 per Dr. Duffy Rhody.  She was placed in a cervical hard collar and was able to remove for showering.  She was cleared to begin Lovenox for DVT prophylaxis 02/03/2022.  Her diet has been advanced to a regular consistency.  She did have some urinary retention suspect neurogenic bladder initially placed on Flomax since discontinued with lattest bladder scan 451 ml  and refused  intermittent catheters patient considering Foley to.  Therapy evaluations completed due to patient decreased functional mobility was admitted for a comprehensive rehab program. Currently complains of pain in hand joints.   Review of Systems  Constitutional:  Negative for chills and fever.  HENT:  Negative for hearing loss.   Eyes:  Negative for blurred vision and double vision.  Respiratory:  Negative for cough and shortness of breath.   Cardiovascular:  Negative for chest pain, palpitations and leg swelling.  Gastrointestinal:  Positive for constipation. Negative for heartburn, nausea and vomiting.  Genitourinary:  Negative for dysuria, flank pain and hematuria.  Musculoskeletal:  Positive for joint pain and myalgias.  Skin:  Negative for rash.  Neurological:  Positive for sensory change and weakness.  All other systems reviewed and are negative. Past Medical History:  Diagnosis Date   Hypertension    Psoriasis    Past Surgical History:  Procedure Laterality Date   ABDOMINAL HYSTERECTOMY     ANTERIOR CERVICAL DECOMP/DISCECTOMY FUSION N/A 02/02/2022   Procedure: CERVICAL FOUR CORPECTOMY WITH PLATE REMOVAL;  Surgeon: Vallarie Mare, MD;  Location: [facility];  Service: Neurosurgery;  Laterality: N/A;   BACK SURGERY     No family history on file. Social History:  reports that she has never smoked. She has never used smokeless tobacco. She reports that she does not drink alcohol and does not use drugs. Allergies:  Allergies  Allergen Reactions   Atorvastatin Other (See Comments)    Muscle weakness   Codeine Nausea Only   Ezetimibe Other (See Comments)    Muscle weakness   Pitavastatin Other (See  Comments)    Muscle weakness   Pravastatin Other (See Comments)    Weakness    Rosuvastatin Other (See Comments)    Muscle weakness   Medications Prior to Admission  Medication Sig Dispense Refill   Apremilast (OTEZLA) 30 MG TABS Take 30 mg by mouth 2 (two) times daily.     D 1000 25  MCG (1000 UT) capsule Take 1,000 Units by mouth daily.     ibuprofen (ADVIL) 200 MG tablet Take 200 mg by mouth every 6 (six) hours as needed for mild pain.     lisinopril (PRINIVIL,ZESTRIL) 40 MG tablet Take 40 mg by mouth daily.     loratadine (CLARITIN) 10 MG tablet Take 10 mg by mouth daily.     omega-3 acid ethyl esters (LOVAZA) 1 g capsule Take 2 g by mouth 2 (two) times daily.       Physical Exam: There were no vitals taken for this visit. Gen: no distress, normal appearing HEENT: oral mucosa pink and moist, NCAT, hard cervical collar in place Cardio: Reg rate Chest: normal effort, normal rate of breathing Abd: soft, non-distended Ext: no edema Psych: pleasant, normal affect Skin: Incision site clean and dry.  Neuro:  Patient is alert.  Oriented x3 and follows commands.  Musculoskeletal: Hard cervical collar in place, arthritis of small hand joints. RUE 4/5, LUE 3/5, 4/5 in lower extremities b/l  No results found for this or any previous visit (from the past 48 hour(s)). No results found.    There were no vitals taken for this visit.  Medical Problem List and Plan: 1. 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  Status post C4 corpectomy including discectomies and bilateral foraminotomies with placement of corpectomy cage C4 02/02/2022.  Cervical collar as directed  -patient may shower  -ELOS/Goals: 5-7 days S  -Admit to CIR 2.  Antithrombotics: -DVT/anticoagulation:  Pharmaceutical: Lovenox check vascular study  -antiplatelet therapy: N/A 3. Postoperative pain: continue Tramadol 50 mg every 6 hours, Neurontin 100 mg twice daily, Flexeril 10 mg 3 times daily as needed, oxycodone as needed 4. Mood: Provide emotional support  -antipsychotic agents: N/A 5. Neuropsych: This patient is capable of making decisions on her own behalf. 6. Skin/Wound Care: Routine skin checks 7. Fluids/Electrolytes/Nutrition: Routine in and outs with follow-up chemistries 8.   Neurogenic bowel and bladder.  Check PVR.  Patient had initially refused intermittent catheterization.  Establish bowel program 9.  Dysphagia.  Improved.  Diet advanced to regular 10.  Hypertension.  Patient on lisinopril 40 mg daily prior to admission.  Resume as needed 11.  Hyperlipidemia.  Restart Lovaza 2 g twice daily 12.  Psoriasis.  Restart Oteleza 30 mg twice daily 13. Bilateral hand OA: add voltaren gel.   I have personally performed a face to face diagnostic evaluation, including, but not limited to relevant history and physical exam findings, of this patient and developed relevant assessment and plan.  Additionally, I have reviewed and concur with the physician assistant's documentation above.  Leeroy Cha, MD, ABPMR   Lavon Paganini Angiulli, PA-C

## 2022-02-12 ENCOUNTER — Inpatient Hospital Stay (HOSPITAL_COMMUNITY): Payer: Medicare Other

## 2022-02-12 DIAGNOSIS — L8990 Pressure ulcer of unspecified site, unspecified stage: Secondary | ICD-10-CM | POA: Insufficient documentation

## 2022-02-12 DIAGNOSIS — M7989 Other specified soft tissue disorders: Secondary | ICD-10-CM

## 2022-02-12 DIAGNOSIS — S14129A Central cord syndrome at unspecified level of cervical spinal cord, initial encounter: Secondary | ICD-10-CM | POA: Diagnosis not present

## 2022-02-12 LAB — COMPREHENSIVE METABOLIC PANEL
ALT: 14 U/L (ref 0–44)
AST: 16 U/L (ref 15–41)
Albumin: 2.1 g/dL — ABNORMAL LOW (ref 3.5–5.0)
Alkaline Phosphatase: 42 U/L (ref 38–126)
Anion gap: 11 (ref 5–15)
BUN: 7 mg/dL — ABNORMAL LOW (ref 8–23)
CO2: 26 mmol/L (ref 22–32)
Calcium: 8.5 mg/dL — ABNORMAL LOW (ref 8.9–10.3)
Chloride: 99 mmol/L (ref 98–111)
Creatinine, Ser: 0.73 mg/dL (ref 0.44–1.00)
GFR, Estimated: >60 mL/min (ref >60)
Glucose, Bld: 100 mg/dL — ABNORMAL HIGH (ref 70–99)
Potassium: 2.9 mmol/L — ABNORMAL LOW (ref 3.5–5.1)
Sodium: 136 mmol/L (ref 135–145)
Total Bilirubin: 0.7 mg/dL (ref 0.3–1.2)
Total Protein: 5.6 g/dL — ABNORMAL LOW (ref 6.5–8.1)

## 2022-02-12 LAB — CBC WITH DIFFERENTIAL/PLATELET
Abs Immature Granulocytes: 0.17 10*3/uL — ABNORMAL HIGH (ref 0.00–0.07)
Basophils Absolute: 0 10*3/uL (ref 0.0–0.1)
Basophils Relative: 1 %
Eosinophils Absolute: 0.3 10*3/uL (ref 0.0–0.5)
Eosinophils Relative: 4 %
HCT: 27.8 % — ABNORMAL LOW (ref 36.0–46.0)
Hemoglobin: 8.7 g/dL — ABNORMAL LOW (ref 12.0–15.0)
Immature Granulocytes: 3 %
Lymphocytes Relative: 18 %
Lymphs Abs: 1.1 10*3/uL (ref 0.7–4.0)
MCH: 27.5 pg (ref 26.0–34.0)
MCHC: 31.3 g/dL (ref 30.0–36.0)
MCV: 88 fL (ref 80.0–100.0)
Monocytes Absolute: 0.6 10*3/uL (ref 0.1–1.0)
Monocytes Relative: 11 %
Neutro Abs: 3.9 10*3/uL (ref 1.7–7.7)
Neutrophils Relative %: 63 %
Platelets: 341 10*3/uL (ref 150–400)
RBC: 3.16 MIL/uL — ABNORMAL LOW (ref 3.87–5.11)
RDW: 13.5 % (ref 11.5–15.5)
WBC: 6.1 10*3/uL (ref 4.0–10.5)
nRBC: 0 % (ref 0.0–0.2)

## 2022-02-12 MED ORDER — POTASSIUM CHLORIDE CRYS ER 20 MEQ PO TBCR
40.0000 meq | EXTENDED_RELEASE_TABLET | Freq: Three times a day (TID) | ORAL | Status: AC
  Administered 2022-02-12 (×3): 40 meq via ORAL
  Filled 2022-02-12 (×3): qty 2

## 2022-02-12 MED ORDER — APREMILAST 30 MG PO TABS
30.0000 mg | ORAL_TABLET | Freq: Two times a day (BID) | ORAL | Status: DC
  Administered 2022-02-12 – 2022-02-25 (×27): 30 mg via ORAL
  Filled 2022-02-12 (×26): qty 1

## 2022-02-12 MED ORDER — ENOXAPARIN SODIUM 100 MG/ML IJ SOSY
90.0000 mg | PREFILLED_SYRINGE | Freq: Two times a day (BID) | INTRAMUSCULAR | Status: DC
  Administered 2022-02-12 – 2022-02-23 (×22): 90 mg via SUBCUTANEOUS
  Filled 2022-02-12 (×25): qty 0.9

## 2022-02-12 NOTE — Discharge Instructions (Addendum)
Inpatient Rehab Discharge Instructions  Shelby Fleming Discharge date and time: No discharge date for patient encounter.   Activities/Precautions/ Functional Status: Activity: Cervical collar as directed Diet: regular diet Wound Care: Routine skin checks Functional status:  ___ No restrictions     ___ Walk up steps independently ___ 24/7 supervision/assistance   ___ Walk up steps with assistance ___ Intermittent supervision/assistance  ___ Bathe/dress independently ___ Walk with walker     _x__ Bathe/dress with assistance ___ Walk Independently    ___ Shower independently ___ Walk with assistance    ___ Shower with assistance ___ No alcohol     ___ Return to work/school ________   COMMUNITY REFERRALS UPON DISCHARGE:    Outpatient: PT     OT                Agency: [facility]  Phone:  [phone]             Appointment Date/Time: *Please expect follow-up within 7-10 business days to schedule your appointment. If you have not received follow-up, be sure to contact the sitedirectly.*  Medical Equipment/Items Ordered: rolling walker and 3in1 bedside commode                                                  Agency/Supplier: [employer] [phone]    Special Instructions:  No driving smoking or alcohol  My questions have been answered and I understand these instructions. I will adhere to these goals and the provided educational materials after my discharge from the hospital.  Patient/Caregiver Signature _______________________________ Date __________  Clinician Signature _______________________________________ Date __________  Please bring this form and your medication list with you to all your follow-up doctor's appointments.    _______________________________________________________________________________________________________ Information on my medicine - ELIQUIS (apixaban)  This medication education was reviewed with me or my healthcare representative as  part of my discharge preparation.  Why was Eliquis prescribed for you? Eliquis was prescribed for you to reduce the risk of blood clots forming after orthopedic surgery.    What do You need to know about Eliquis? Take your Eliquis TWICE DAILY - one tablet in the morning and one tablet in the evening with or without food.  It would be best to take the dose about the same time each day.  If you have difficulty swallowing the tablet whole please discuss with your pharmacist how to take the medication safely.  Take Eliquis exactly as prescribed by your doctor and DO NOT stop taking Eliquis without talking to the doctor who prescribed the medication.  Stopping without other medication to take the place of Eliquis may increase your risk of developing a clot.  After discharge, you should have regular check-up appointments with your healthcare provider that is prescribing your Eliquis.  What do you do if you miss a dose? If a dose of ELIQUIS is not taken at the scheduled time, take it as soon as possible on the same day and twice-daily administration should be resumed.  The dose should not be doubled to make up for a missed dose.  Do not take more than one tablet of ELIQUIS at the same time.  Important Safety Information A possible side effect of Eliquis is bleeding. You should call your healthcare provider right away if you experience any of the following: Bleeding from an injury or your nose  that does not stop. Unusual colored urine (red or dark brown) or unusual colored stools (red or black). Unusual bruising for unknown reasons. A serious fall or if you hit your head (even if there is no bleeding).  Some medicines may interact with Eliquis and might increase your risk of bleeding or clotting while on Eliquis. To help avoid this, consult your healthcare provider or pharmacist prior to using any new prescription or non-prescription medications, including herbals, vitamins, non-steroidal  anti-inflammatory drugs (NSAIDs) and supplements.  This website has more information on Eliquis (apixaban): http://www.eliquis.com/eliquis/home

## 2022-02-12 NOTE — Evaluation (Signed)
Physical Therapy Assessment and Plan  Patient Details  Name: Kellyann Ordway MRN: [id number] Date of Birth: May 24, 1946  PT Diagnosis: Abnormality of gait, Difficulty walking, Muscle weakness, and Osteoarthritis Rehab Potential: Good ELOS: 10-14 days   Today's Date: 02/12/2022 PT Individual Time: 0846-1000 PT Individual Time Calculation (min): 74 min    Hospital Problem: Principal Problem:   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[place]) Active Problems:   Pressure injury of skin   Past Medical History:  Past Medical History:  Diagnosis Date   Hypertension    Psoriasis    Past Surgical History:  Past Surgical History:  Procedure Laterality Date   ABDOMINAL HYSTERECTOMY     ANTERIOR CERVICAL DECOMP/DISCECTOMY FUSION N/A 02/02/2022   Procedure: CERVICAL FOUR CORPECTOMY WITH PLATE REMOVAL;  Surgeon: Vallarie Mare, MD;  Location: [facility];  Service: Neurosurgery;  Laterality: N/A;   BACK SURGERY      Assessment & Plan Clinical Impression: Ozie Dimaria is a 76 year old right-handed female with history of hypertension, psoriasis as well as prior history ACDF.  Per chart review patient lives alone.  1 level home with 2 steps to entry.  Independent prior to admission.  Daughter plans to provide assistance as needed on discharge.  Presented 01/31/2022 after mechanical fall.  By report patient tripped and fell into her storm door on her left side with severe dysesthesias to all 4 extremities.  No loss of consciousness.  Denied any syncope associated with the fall.  Cranial CT scan negative.  Bilateral hip and pelvis films negative.  CT cervical spine showed fracture through the C4 vertebral body in the inferior anterior corner at C3.  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  MRI cervical spine again known C4 body and C3 anterior inferior corner fractures.  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  No visible injury to the posterior elements.  C4-5 cord  flattening primarily from central disc protrusion.  Milder degenerative spinal stenosis C3-4.  By foraminal impingement at C3-4 and C4-5.  Left foraminal impingement of C7-T1 as well as C5-C7 ACDF.  Underwent C4 corpectomy, anterior interbody technique including discectomies and bilateral foraminotomies.  Placement of corpectomy cage at C4.  Placement of anterior instrumentation consisting of cervical plate and screws Y8-6-5 02/02/2022 per Dr. Duffy Rhody.  She was placed in a cervical hard collar and was able to remove for showering.  She was cleared to begin Lovenox for DVT prophylaxis 02/03/2022.  Her diet has been advanced to a regular consistency.  She did have some urinary retention suspect neurogenic bladder initially placed on Flomax since discontinued with lattest bladder scan 451 ml  and refused intermittent catheters patient considering Foley to.  Therapy evaluations completed due to patient decreased functional mobility was admitted for a comprehensive rehab program. Currently complains of pain in hand joints.     Patient currently requires mod with mobility secondary to muscle weakness and decreased coordination.  Prior to hospitalization, patient was independent  with mobility and lived with Alone in a House home.  Home access is 2 w/o rails.Stairs to enter.  Patient will benefit from skilled PT intervention to maximize safe functional mobility, minimize fall risk, and decrease caregiver burden for planned discharge home with 24 hour supervision.  Anticipate patient will benefit from follow up OP at discharge.  PT - End of Session Activity Tolerance: Tolerates 30+ min activity with multiple rests Endurance Deficit: Yes PT Assessment Rehab Potential (ACUTE/IP ONLY): Good PT Barriers to  Discharge: Home environment access/layout PT Patient demonstrates impairments in the following area(s): Balance;Endurance;Motor PT Transfers Functional Problem(s): Bed Mobility;Bed to  Chair;Car;Furniture PT Locomotion Functional Problem(s): Ambulation;Stairs PT Plan PT Intensity: Minimum of 1-2 x/day ,45 to 90 minutes PT Frequency: 5 out of 7 days PT Duration Estimated Length of Stay: 10-14 days PT Treatment/Interventions: Ambulation/gait training;Community reintegration;Stair training;UE/LE Strength taining/ROM;Discharge planning;Balance/vestibular training;Therapeutic Activities;UE/LE Coordination activities;Functional mobility training;Patient/family education;Therapeutic Exercise PT Transfers Anticipated Outcome(s): supervision PT Locomotion Anticipated Outcome(s): supervision w/ LRAD PT Recommendation Follow Up Recommendations: Outpatient PT Patient destination: Home Equipment Recommended: To be determined Equipment Details: most likely RW   PT Evaluation Precautions/Restrictions Precautions Precautions: Fall;Cervical Required Braces or Orthoses: Cervical Brace Cervical Brace: Hard collar;At all times;Other (comment) Restrictions Weight Bearing Restrictions: No General Chart Reviewed: Yes Family/Caregiver Present: Yes Vital Signs Pain Pain Assessment Pain Scale: 0-10 Pain Score: 0-No pain (generalized soreness) Pain Intervention(s): Medication (See eMAR) Pain Interference Pain Interference Pain Effect on Sleep: 2. Occasionally Pain Interference with Therapy Activities: 2. Occasionally Pain Interference with Day-to-Day Activities: 2. Occasionally Home Living/Prior Functioning Home Living Available Help at Discharge: Family;Available 24 hours/day Type of Home: House Home Access: Stairs to enter [employer] of Steps: 2 w/o rails. Entrance Stairs-Rails: None Home Layout: One level Bathroom Shower/Tub: Walk-in shower Additional Comments: daughter plans to stay with pt  Lives With: Alone Prior Function Level of Independence: Independent with transfers;Independent with gait  Able to Take Stairs?: Yes Driving: Yes Vision/Perception      Cognition Overall Cognitive Status: Within Functional Limits for tasks assessed Arousal/Alertness: Awake/alert Orientation Level: Oriented X4 Safety/Judgment: Appears intact Sensation Sensation Light Touch: Appears Intact Coordination Gross Motor Movements are Fluid and Coordinated: No Fine Motor Movements are Fluid and Coordinated: No Motor  Motor Motor: Within Functional Limits Motor - Skilled Clinical Observations: R UE sore   Trunk/Postural Assessment  Lumbar Assessment Lumbar Assessment: Exceptions to [facility] (posterior pelvic tilt.)  Balance Balance Balance Assessed: Yes Dynamic Sitting Balance Sitting balance - Comments: able to maintain seated balance w/o UE support Extremity Assessment      RLE Assessment RLE Assessment: Within Functional Limits General Strength Comments: grossly 4/5 LLE Assessment LLE Assessment: Within Functional Limits General Strength Comments: grosly WFL, not formally addressed 2/2 generalized pain/soreness throughout and arthritic flare L ankle.  Care Tool Care Tool Bed Mobility Roll left and right activity        Sit to lying activity        Lying to sitting on side of bed activity   Lying to sitting on side of bed assist level: the ability to move from lying on the back to sitting on the side of the bed with no back support.: Moderate Assistance - Patient 50 - 74%     Care Tool Transfers Sit to stand transfer   Sit to stand assist level: Minimal Assistance - Patient > 75%    Chair/bed transfer   Chair/bed transfer assist level: Minimal Assistance - Patient > 75%     Physiological scientist transfer assist level: Moderate Assistance - Patient 50 - 74%      Care Tool Locomotion Ambulation   Assist level: Minimal Assistance - Patient > 75% Assistive device: Walker-rolling Max distance: 65  Walk 10 feet activity   Assist level: Minimal Assistance - Patient > 75% Assistive device: Walker-rolling   Walk  50 feet with 2 turns activity Walk 50 feet with 2 turns activity did not occur: Safety/medical  concerns      Walk 150 feet activity Walk 150 feet activity did not occur: Safety/medical concerns      Walk 10 feet on uneven surfaces activity Walk 10 feet on uneven surfaces activity did not occur: Safety/medical concerns      Stairs Stair activity did not occur: Safety/medical concerns        Walk up/down 1 step activity Walk up/down 1 step or curb (drop down) activity did not occur: Safety/medical concerns      Walk up/down 4 steps activity Walk up/down 4 steps activity did not occur: Safety/medical concerns      Walk up/down 12 steps activity Walk up/down 12 steps activity did not occur: Safety/medical concerns      Pick up small objects from floor Pick up small object from the floor (from standing position) activity did not occur: Safety/medical concerns      Wheelchair Is the patient using a wheelchair?: No Type of Wheelchair: Manual   Wheelchair assist level: Dependent - Patient 0%    Wheel 50 feet with 2 turns activity   Assist Level: Dependent - Patient 0%  Wheel 150 feet activity   Assist Level: Dependent - Patient 0%    Refer to Care Plan for Long Term Goals  SHORT TERM GOAL WEEK 1 PT Short Term Goal 1 (Week 1): Pt will transfer sup to sit w/ log roll w/ min A. PT Short Term Goal 2 (Week 1): Pt will transfer sit to stand w/ CGA. PT Short Term Goal 3 (Week 1): Pt will amb 100' w/ RW and CGA. PT Short Term Goal 4 (Week 1): Address stairs  Recommendations for other services: None   Skilled Therapeutic Intervention Evaluation completed (see details above and below) with education on PT POC and goals and individual treatment initiated with focus on  strengthening, endurance, gait, transfers.  Pt presents semi-reclined in bed and agreeable to therapy.  Pt states generalized soreness 2/2 arthritis and increased pain L ankle.  Pt wearing hard C-collar at all times.  Pt  performed log roll to L w/ min to mod A to transfer to sitting EOB.  Pt sat EOB for threading of pants over feet and then able to pull up to hips.  Pt stood at EOB w/ RW and able to pull pants over hips w/ mod A.  Pt sat EOB and donned bra and overhead shirt w/ mod A of dtr or PT.  Pt transferred sit to stand and then SPT w/ RW to w/c w/ min A and verbal cues. Swiss cheese pad placed on w/c for comfort.  RW adjusted for height.  Pt amb x 10' to simulated car transfer of small SUV.  Pt required mod A for transfer into car, but CGA and verbal cues for turn to get feet out of car.  Pt able to amb 5' w/ RW and min A, w/ 1 standing rest break.  Pt returned to room and remained sitting in w/c.  Dtr present and all needs in reach.   Mobility Bed Mobility Bed Mobility: Left Sidelying to Sit Left Sidelying to Sit: Moderate Assistance - Patient 50-74%;Minimal Assistance - Patient >75% Transfers Transfers: Sit to Stand;Stand to Sit;Stand Pivot Transfers Sit to Stand: Minimal Assistance - Patient > 75% Stand to Sit: Minimal Assistance - Patient > 75% Stand Pivot Transfers: Minimal Assistance - Patient > 75% Stand Pivot Transfer Details: Verbal cues for precautions/safety;Verbal cues for safe use of DME/AE Transfer (Assistive device): Rolling walker Locomotion  Gait  Gait Distance (Feet): 65 Feet Gait Gait: Yes Gait Pattern: Decreased step length - left;Decreased step length - right;Step-through pattern Stairs / Additional Locomotion Stairs: No Wheelchair Mobility Wheelchair Mobility: No   Discharge Criteria: Patient will be discharged from PT if patient refuses treatment 3 consecutive times without medical reason, if treatment goals not met, if there is a change in medical status, if patient makes no progress towards goals or if patient is discharged from hospital.  The above assessment, treatment plan, treatment alternatives and goals were discussed and mutually agreed upon: by patient and by  family  Ladoris Gene 02/12/2022, 12:42 PM

## 2022-02-12 NOTE — Evaluation (Signed)
Occupational Therapy Assessment and Plan  Patient Details  Name: Shelby Fleming MRN: [id number] Date of Birth: 1946/03/05  OT Diagnosis: acute pain and muscle weakness (generalized) Rehab Potential: Rehab Potential (ACUTE ONLY): Excellent ELOS: 10-14 days   Today's Date: 02/12/2022 OT Individual Time: 1300-1420 OT Individual Time Calculation (min): 80 min     Hospital Problem: Principal Problem:   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[place]) Active Problems:   Pressure injury of skin   Past Medical History:  Past Medical History:  Diagnosis Date   Hypertension    Psoriasis    Past Surgical History:  Past Surgical History:  Procedure Laterality Date   ABDOMINAL HYSTERECTOMY     ANTERIOR CERVICAL DECOMP/DISCECTOMY FUSION N/A 02/02/2022   Procedure: CERVICAL FOUR CORPECTOMY WITH PLATE REMOVAL;  Surgeon: Vallarie Mare, MD;  Location: [facility];  Service: Neurosurgery;  Laterality: N/A;   BACK SURGERY      Assessment & Plan Clinical Impression: Patient is a 76 y.o. year old female with history of hypertension, psoriasis as well as prior history ACDF.  Per chart review patient lives alone.  1 level home with 2 steps to entry.  Independent prior to admission.  Daughter plans to provide assistance as needed on discharge.  Presented 01/31/2022 after mechanical fall.  By report patient tripped and fell into her storm door on her left side with severe dysesthesias to all 4 extremities.  No loss of consciousness.  Denied any syncope associated with the fall.  Cranial CT scan negative.  Bilateral hip and pelvis films negative.  CT cervical spine showed fracture through the C4 vertebral body in the inferior anterior corner at C3.  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  MRI cervical spine again known C4 body and C3 anterior inferior corner fractures.  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  No visible injury to the posterior elements.  C4-5 cord  flattening primarily from central disc protrusion.  Milder degenerative spinal stenosis C3-4.  By foraminal impingement at C3-4 and C4-5.  Left foraminal impingement of C7-T1 as well as C5-C7 ACDF.  Underwent C4 corpectomy, anterior interbody technique including discectomies and bilateral foraminotomies.  Placement of corpectomy cage at C4.  Placement of anterior instrumentation consisting of cervical plate and screws Q6-5-7 02/02/2022 per Dr. Duffy Rhody. Patient transferred to CIR on 02/11/2022 .    Patient currently requires  Max assist overall  with basic self-care skills secondary to muscle weakness, unbalanced muscle activation and decreased coordination, and decreased postural control.  Prior to hospitalization, patient could complete ADL and IADL tasks with independent .  Patient will benefit from skilled intervention to decrease level of assist with basic self-care skills and increase independence with basic self-care skills prior to discharge home with care partner.  Anticipate patient will require intermittent supervision and minimal physical assistance and follow up home health.  OT - End of Session Activity Tolerance: Tolerates 10 - 20 min activity with multiple rests Endurance Deficit: Yes Endurance Deficit Description: Fatigue noted during sink level ADL OT Assessment Rehab Potential (ACUTE ONLY): Excellent OT Patient demonstrates impairments in the following area(s): Motor;Pain OT Basic ADL's Functional Problem(s): Eating;Grooming;Bathing;Dressing;Toileting OT Advanced ADL's Functional Problem(s): Simple Meal Preparation OT Transfers Functional Problem(s): Toilet;Tub/Shower OT Additional Impairment(s): Fuctional Use of Upper Extremity OT Plan OT Intensity: Minimum of 1-2 x/day, 45 to 90 minutes OT Frequency: 5 out of 7 days OT Duration/Estimated Length of Stay: 10-14 days OT Treatment/Interventions: Neuromuscular re-education;Self Care/advanced ADL  retraining;Therapeutic  Exercise;UE/LE Strength taining/ROM;Therapeutic Activities;Discharge planning;Patient/family education;UE/LE Coordination activities;Pain management;DME/adaptive equipment instruction;Community reintegration;Functional mobility training;Balance/vestibular training OT Self Feeding Anticipated Outcome(s): Independent OT Basic Self-Care Anticipated Outcome(s): SBA upper body, Min A lower body OT Toileting Anticipated Outcome(s): SBA OT Bathroom Transfers Anticipated Outcome(s): SBA OT Recommendation Recommendations for Other Services: Therapeutic Recreation consult Therapeutic Recreation Interventions: Clinical cytogeneticist;Outing/community reintergration Patient destination: Home Follow Up Recommendations: Home health OT Equipment Recommended: To be determined Equipment Details: Pt reports that her Daughter has purchased a shower chair already. She owns a Secondary school teacher.   OT Evaluation Precautions/Restrictions  Precautions Precautions: Fall;Cervical Required Braces or Orthoses: Cervical Brace Cervical Brace: Hard collar;At all times Restrictions Weight Bearing Restrictions: No General   Vital Signs Therapy Vitals Temp: 98.6 F (37 C) Temp Source: Oral Pulse Rate: 91 Resp: 16 BP: 134/70 Patient Position (if appropriate): Sitting Oxygen Therapy SpO2: 99 % O2 Device: Room Air Pain Pain Assessment Pain Scale: 0-10 Pain Score: 6  Pain Type: Acute pain Pain Location: Generalized Home Living/Prior Functioning Home Living Family/patient expects to be discharged to:: Private residence Living Arrangements: Alone Available Help at Discharge: Family, Available 24 hours/day (Daughter is planning to move in temporarily to assist.) Type of Home: House Home Access: Stairs to enter Technical brewer of Steps: 2 Entrance Stairs-Rails: None Home Layout: One level Bathroom Shower/Tub: Multimedia programmer: Standard Additional Comments: Patient own a Secondary school teacher. Reports  that her Daughter has purchased her a shower chair.  Lives With: Alone IADL History Homemaking Responsibilities: Yes Meal Prep Responsibility: Primary Laundry Responsibility: Primary Cleaning Responsibility: Primary Bill Paying/Finance Responsibility: Primary Shopping Responsibility: Primary Current License: Yes Mode of Transportation: Car Prior Function Level of Independence: Independent with transfers, Independent with gait  Able to Take Stairs?: Yes Driving: Yes Vision Baseline Vision/History: 1 Wears glasses Ability to See in Adequate Light: 1 Impaired Patient Visual Report: No change from baseline Vision Assessment?: No apparent visual deficits Perception  Perception: Within Functional Limits Praxis Praxis: Intact Cognition Cognition Overall Cognitive Status: Within Functional Limits for tasks assessed Arousal/Alertness: Awake/alert Memory: Appears intact Awareness: Appears intact Safety/Judgment: Appears intact Brief Interview for Mental Status (BIMS) Repetition of Three Words (First Attempt): 3 Temporal Orientation: Year: Correct Temporal Orientation: Month: Accurate within 5 days Temporal Orientation: Day: Correct Recall: "Sock": No, could not recall Recall: "Blue": Yes, after cueing ("a color") Recall: "Bed": Yes, after cueing ("a piece of furniture") BIMS Summary Score: 11 Sensation Sensation Light Touch: Not tested Hot/Cold: Not tested Proprioception: Not tested Stereognosis: Not tested Additional Comments: Sensation to be tested at a later session Coordination Gross Motor Movements are Fluid and Coordinated: No Fine Motor Movements are Fluid and Coordinated: No Coordination and Movement Description: slow motor movements with left slower than right. Finger Nose Finger Test: NT 9 Hole Peg Test: NT Motor  Motor Motor: Within Functional Limits  Trunk/Postural Assessment  Cervical Assessment Cervical Assessment: Exceptions to [facility] (Unable to assess due  to cervical precautions) Postural Control Postural Control: Deficits on evaluation Postural Limitations: Pt with forward head and rounded shoulders  Balance Balance Balance Assessed: Yes (No concerns noted during static standing and sitting activities.) Extremity/Trunk Assessment RUE Assessment RUE Assessment: Exceptions to [facility] Active Range of Motion (AROM) Comments: Patient able to demonstrate shoulder 50% flexion and abduction, impaired er, functional IR. RUE AROM (degrees) Overall AROM Right Upper Extremity: Deficits RUE Strength RUE Overall Strength: Deficits Right Shoulder Flexion: 3-/5 Right Shoulder ABduction: 3-/5 Right Shoulder Internal Rotation: 3/5 Right Shoulder External Rotation: 3-/5 Right Shoulder Horizontal ABduction:  3-/5 Right Shoulder Horizontal ADduction: 3-/5 Right Hand Gross Grasp: Impaired LUE Assessment LUE Assessment: Exceptions to [facility] Active Range of Motion (AROM) Comments: Pt demonstrates slightly less than functional A/ROM due to muscle weakness and lack of activity during hospitalization. LUE Strength LUE Overall Strength: Deficits Left Shoulder Flexion: 3-/5 Left Shoulder ABduction: 3-/5 Left Shoulder Internal Rotation: 3/5 Left Shoulder External Rotation: 3-/5 Left Shoulder Horizontal ABduction: 3-/5 Left Shoulder Horizontal ADduction: 3-/5 Left Hand Gross Grasp: Impaired  Care Tool Care Tool Self Care Eating   Eating Assist Level: Set up assist    Oral Care    Oral Care Assist Level: Minimal Assistance - Patient > 75%    Bathing   Body parts bathed by patient: Right arm;Left arm;Chest;Abdomen;Front perineal area;Right upper leg;Left upper leg;Face Body parts bathed by helper: Left lower leg;Right lower leg;Buttocks   Assist Level: Maximal Assistance - Patient 24 - 49%    Upper Body Dressing(including orthotics)   What is the patient wearing?: Bra;Pull over shirt;Orthosis Orthosis activity level: Performed by helper Assist Level:  Maximal Assistance - Patient 25 - 49%    Lower Body Dressing (excluding footwear)   What is the patient wearing?: Underwear/pull up;Pants Assist for lower body dressing: Maximal Assistance - Patient 25 - 49%    Putting on/Taking off footwear   What is the patient wearing?: Non-skid slipper socks Assist for footwear: Total Assistance - Patient < 25%       Care Tool Toileting Toileting activity   Assist for toileting: Maximal Assistance - Patient 25 - 49%       Care Tool Transfers Sit to stand transfer   Sit to stand assist level: Moderate Assistance - Patient 50 - 74%    Chair/bed transfer   Chair/bed transfer assist level: Moderate Assistance - Patient 50 - 74%     Toilet transfer   Assist Level: Moderate Assistance - Patient 50 - 74%     Care Tool Cognition  Expression of Ideas and Wants Expression of Ideas and Wants: 4. Without difficulty (complex and basic) - expresses complex messages without difficulty and with speech that is clear and easy to understand  Understanding Verbal and Non-Verbal Content Understanding Verbal and Non-Verbal Content: 4. Understands (complex and basic) - clear comprehension without cues or repetitions   Memory/Recall Ability Memory/Recall Ability : Staff names and faces;That he or she is in a hospital/hospital unit;Current season   Refer to Care Plan for Long Term Goals  SHORT TERM GOAL WEEK 1 OT Short Term Goal 1 (Week 1): Patient will complete toilet transfer using RW with Min A OT Short Term Goal 2 (Week 1): Patient will increase BUE A/ROM to [facility] in order to increase her ability to complete upper body bathing and dressing with less difficulty. OT Short Term Goal 3 (Week 1): Patient will increase her ability to comolete upper and lower body bathing with Mod assist with use of AE as needed. OT Short Term Goal 4 (Week 1): Patient will demonstrate improved activity tolerance while completing morning self care routine for 60 minutes requiring only  2-3 rest breaks.  Recommendations for other services: Surveyor, mining group, Stress management, and Outing/community reintegration   Skilled Therapeutic Intervention  Patient completed self care ADL at sink level requiring increased physical assist to complete due to cervical precautions, cervical mobility limitations, BUE weakness, and decreased activity tolerance. Patient was provided with VC throughout session for form and technique including activity modifications in order to maximize functional performance.  Reviewed  goals for therapy, safety belt use, and general structure of therapy schedule.    ADL ADL Eating: Minimal assistance Where Assessed-Eating: Bed level Grooming: Minimal assistance Where Assessed-Grooming: Sitting at sink Upper Body Bathing: Maximal assistance Where Assessed-Upper Body Bathing: Sitting at sink Lower Body Bathing: Maximal assistance Where Assessed-Lower Body Bathing: Sitting at sink Upper Body Dressing: Dependent Where Assessed-Upper Body Dressing: Sitting at sink Lower Body Dressing: Maximal assistance Where Assessed-Lower Body Dressing: Standing at sink;Sitting at sink Toileting: Minimal assistance Where Assessed-Toileting: Bedside Commode Toilet Transfer: Moderate assistance Toilet Transfer Method: Stand pivot Social research officer, government: Moderate assistance Social research officer, government Method: Ambulating Mobility  Bed Mobility Bed Mobility: Sitting - Scoot to Edge of Bed Sitting - Scoot to [employer] of Bed: Supervision/Verbal cueing Transfers Sit to Stand: Moderate Assistance - Patient 50-74% Stand to Sit: Moderate Assistance - Patient 50-74%   Discharge Criteria: Patient will be discharged from OT if patient refuses treatment 3 consecutive times without medical reason, if treatment goals not met, if there is a change in medical status, if patient makes no progress towards goals or if patient is discharged from hospital.  The above  assessment, treatment plan, treatment alternatives and goals were discussed and mutually agreed upon: by patient  Ailene Ravel, OTR/L,CBIS  Supplemental OT - [facility]  02/12/2022, 4:59 PM

## 2022-02-12 NOTE — Progress Notes (Signed)
Inpatient Rehabilitation Care Coordinator Assessment and Plan Patient Details  Name: Shelby Fleming MRN: [id number] Date of Birth: 11-May-1946  Today's Date: 02/12/2022  Hospital Problems: Principal Problem:   Central cord syndrome [facility]) Active Problems:   Pressure injury of skin  Past Medical History:  Past Medical History:  Diagnosis Date   Hypertension    Psoriasis    Past Surgical History:  Past Surgical History:  Procedure Laterality Date   ABDOMINAL HYSTERECTOMY     ANTERIOR CERVICAL DECOMP/DISCECTOMY FUSION N/A 02/02/2022   Procedure: CERVICAL FOUR CORPECTOMY WITH PLATE REMOVAL;  Surgeon: Vallarie Mare, MD;  Location: [place];  Service: Neurosurgery;  Laterality: N/A;   BACK SURGERY     Social History:  reports that she has never smoked. She has never used smokeless tobacco. She reports that she does not drink alcohol and does not use drugs.  Family / Support Systems Marital Status: Widow/Widower How Long?: 29 years (Anniversary in June) Patient Roles: Parent Spouse/Significant Other: Widow Children: 1 adult dtr- Shelby Fleming/HCPOA [phone] Other Supports: None reported Anticipated Caregiver: Dtr Shelby Fleming Ability/Limitations of Caregiver: None reported Caregiver Availability: 24/7 Family Dynamics: Pt to d/c to home with support from her dtr  Social History Preferred language: English Religion: Baptist Cultural Background: Pt worked at [employer] for [employer] for 28 yrs until lay off. 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 Education: GED (completed at age 52 yrs old) Health Literacy - How often do you need to have someone help you when you read instructions, pamphlets, or other written material from your doctor or pharmacy?: Never Writes: Yes Employment Status: Retired Date Retired/Disabled/Unemployed: 2020 Age Retired: 73 Public relations account executive Issues:  Denies Guardian/Conservator: N/A   Abuse/Neglect Abuse/Neglect Assessment Can Be Completed: Yes Physical Abuse: Denies Verbal Abuse: Denies Sexual Abuse: Denies Exploitation of patient/patient's resources: Denies Self-Neglect: Denies  Patient response to: Social Isolation - How often do you feel lonely or isolated from those around you?: Never  Emotional Status Pt's affect, behavior and adjustment status: Pt in good spirits at time of visit. Pt remains optimistic about recovery. Recent Psychosocial Issues: Denies Psychiatric History: Denies Substance Abuse History: Denies  Patient / Family Perceptions, Expectations & Goals Pt/Family understanding of illness & functional limitations: Pt and dtr have a general understanding of pt care needs. Premorbid pt/family roles/activities: Independent Anticipated changes in roles/activities/participation: Assistance with ADLs/IADLs Pt/family expectations/goals: Pt goal is to work on R arm since unable to lift and also R handed. Pt reports she is happy about being able to use her legs and feet.  Community Resources [employer] Scripts: None Premorbid Home Care/DME Agencies: None Transportation available at discharge: Dtr Shelby Fleming Is the patient able to respond to transportation needs?: Yes In the past 12 months, has lack of transportation kept you from medical appointments or from getting medications?: No In the past 12 months, has lack of transportation kept you from meetings, work, or from getting things needed for daily living?: No Resource referrals recommended: Neuropsychology  Discharge Planning Living Arrangements: Alone Support Systems: Children, Other relatives, Friends/neighbors Type of Residence: Private residence Insurance Resources: Multimedia programmer (specify) ([place] Medicare) Financial Resources: Radio broadcast assistant Screen Referred: No Living Expenses: Own Money Management: Patient Does the patient have any problems  obtaining your medications?: No Home Management: Pt managed homecare needs Patient/Family Preliminary Plans: TBD Care Coordinator Barriers to Discharge: Decreased caregiver support, Lack of/limited family support Care Coordinator Anticipated Follow Up  Needs: HH/OP  Clinical Impression SW met with pt and pt dtr Shelby Fleming in room to introduce self, explain role, and discuss discharge process. Pt is not a English as a second language teacher. HCPOA-dtr Shelby Fleming. Has walk in shower and 2 STE home. DME: grabber; and pt dtr reports she ordered a shower seat and a temporary hand rail for bed that has already been delivered to the home.   Shelby Fleming A Shelby Fleming 02/12/2022, 3:58 PM

## 2022-02-12 NOTE — Progress Notes (Signed)
PROGRESS NOTE   Subjective/Complaints:  Pt reports sore all over- esp back of head from laying in bed so long.  Can tell when she needs to have BM, but was already on [facility] when went, so don't know if can hold it well.  No BM yesterday- LBM 2 days ago- Needs Otezla and creams for psoriasis- daughter brought in but needs nursing to send to pharmacy to use.   K+ 2.9 this AM  ROS:  Pt denies SOB, abd pain, CP, N/V/C/D, and vision changes   Objective:   No results found. Recent Labs    02/11/22 1939 02/12/22 0514  WBC 6.6 6.1  HGB 9.5* 8.7*  HCT 31.2* 27.8*  PLT 325 341   Recent Labs    02/11/22 1939 02/12/22 0514  NA  --  136  K  --  2.9*  CL  --  99  CO2  --  26  GLUCOSE  --  100*  BUN  --  7*  CREATININE 0.77 0.73  CALCIUM  --  8.5*   No intake or output data in the 24 hours ending 02/12/22 0747   Pressure Injury 02/11/22 Buttocks Right Stage 1 -  Intact skin with non-blanchable redness of a localized area usually over a bony prominence. stage 1 right side along bony prominence (Active)  02/11/22 1830  Location: Buttocks  Location Orientation: Right  Staging: Stage 1 -  Intact skin with non-blanchable redness of a localized area usually over a bony prominence.  Wound Description (Comments): stage 1 right side along bony prominence  Present on Admission: Yes    Physical Exam: Vital Signs Blood pressure (!) 121/55, pulse 87, temperature 97.8 F (36.6 C), temperature source Oral, resp. rate 17, height '5\' 4"'$  (1.626 m), weight 87.2 kg, SpO2 94 %.   General: awake, alert, appropriate, sitting up in bed eating breakfast- not well; NAD HENT: conjugate gaze; oropharynx moist- wearing cervical collar- anterior neck incision covered with honeycomb dressing CV: regular rate and rhythm; no JVD Pulmonary: CTA B/L; no W/R/R- good air movement GI: soft, NT, ND, (+)BS Psychiatric: appropriate-  interactive Neurological: Ox3 Skin: Incision site clean and dry.has some psoriasis Neuro:  Patient is alert.  Oriented x3 and follows commands.  Musculoskeletal: Hard cervical collar in place, arthritis of small hand joints. RUE 4/5, LUE 3/5, 4/5 in lower extremities b/l  Assessment/Plan: 1. Functional deficits which require 3+ hours per day of interdisciplinary therapy in a comprehensive inpatient rehab setting. Physiatrist is providing close team supervision and 24 hour management of active medical problems listed below. Physiatrist and rehab team continue to assess barriers to discharge/monitor patient progress toward functional and medical goals  Care Tool:  Bathing              Bathing assist       Upper Body Dressing/Undressing Upper body dressing        Upper body assist      Lower Body Dressing/Undressing Lower body dressing            Lower body assist       Toileting Toileting    Toileting assist       Transfers Chair/bed  transfer  Transfers assist           Locomotion Ambulation   Ambulation assist              Walk 10 feet activity   Assist           Walk 50 feet activity   Assist           Walk 150 feet activity   Assist           Walk 10 feet on uneven surface  activity   Assist           Wheelchair     Assist               Wheelchair 50 feet with 2 turns activity    Assist            Wheelchair 150 feet activity     Assist          Blood pressure (!) 121/55, pulse 87, temperature 97.8 F (36.6 C), temperature source Oral, resp. rate 17, height '5\' 4"'$  (1.626 m), weight 87.2 kg, SpO2 94 %.  Medical Problem List and Plan: 1. 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  Cervical  collar as directed             -patient may shower             -ELOS/Goals: 5-7 days S             -First day of evaluations- con't PT and OT- might need longer than 5-7 days- will see how does.  2.  Antithrombotics: -DVT/anticoagulation:  Pharmaceutical: Lovenox check vascular study             -antiplatelet therapy: N/A 3. Postoperative pain: continue Tramadol 50 mg every 6 hours, Neurontin 100 mg twice daily, Flexeril 10 mg 3 times daily as needed, oxycodone as needed  6/8- pain controlled with scheduled tramadol 4. Mood: Provide emotional support             -antipsychotic agents: N/A 5. Neuropsych: This patient is capable of making decisions on her own behalf. 6. Skin/Wound Care: Routine skin checks 7. Fluids/Electrolytes/Nutrition: Routine in and outs with follow-up chemistries 8.  Neurogenic bowel and bladder.  Check PVR.  Patient had initially refused intermittent catheterization.  Establish bowel program  6/8- will order bladder scans to see if retaining still- not clear if needs bowel program quite yet.  9.  Dysphagia.  Improved.  Diet advanced to regular 10.  Hypertension.  Patient on lisinopril 40 mg daily prior to admission.  Resume as needed  6/8- BP well controlled off Lisinopril 11.  Hyperlipidemia.  Restart Lovaza 2 g twice daily 12.  Psoriasis.  Restart Oteleza 30 mg twice daily  6/8- will send Otezla and cream to pharmacy so can use.  13. 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 Hypokalemia  6/8- K+ 2.9- will give KCL 40 mEq x3 and recheck in AM- was last 3.7  I spent a total of 35   minutes on total care today- >50% coordination of care- due to d/w nursing about psoriasis and bladder scans.        LOS: 1 days A FACE TO FACE EVALUATION WAS PERFORMED  Ismael Treptow 02/12/2022, 7:47 AM

## 2022-02-12 NOTE — Progress Notes (Signed)
Bilateral lower extremity venous duplex has been completed. Preliminary results can be found in CV Proc through chart review.  Results were given to Lauraine Rinne PA.  02/12/22 12:50 PM Shelby Fleming RVT

## 2022-02-12 NOTE — Plan of Care (Signed)
  Problem: RH Balance Goal: LTG Patient will maintain dynamic standing with ADLs (OT) Description: LTG:  Patient will maintain dynamic standing balance with assist during activities of daily living (OT)  Flowsheets (Taken 02/12/2022 1704) LTG: Pt will maintain dynamic standing balance during ADLs with: Supervision/Verbal cueing   Problem: RH Eating Goal: LTG Patient will perform eating w/assist, cues/equip (OT) Description: LTG: Patient will perform eating with assist, with/without cues using equipment (OT) Flowsheets (Taken 02/12/2022 1704) LTG: Pt will perform eating with assistance level of: Independent   Problem: RH Grooming Goal: LTG Patient will perform grooming w/assist,cues/equip (OT) Description: LTG: Patient will perform grooming with assist, with/without cues using equipment (OT) Flowsheets (Taken 02/12/2022 1704) LTG: Pt will perform grooming with assistance level of: Independent with assistive device    Problem: RH Bathing Goal: LTG Patient will bathe all body parts with assist levels (OT) Description: LTG: Patient will bathe all body parts with assist levels (OT) Flowsheets (Taken 02/12/2022 1704) LTG: Pt will perform bathing with assistance level/cueing: Minimal Assistance - Patient > 75% LTG: Position pt will perform bathing: Shower   Problem: RH Dressing Goal: LTG Patient will perform upper body dressing (OT) Description: LTG Patient will perform upper body dressing with assist, with/without cues (OT). Flowsheets (Taken 02/12/2022 1704) LTG: Pt will perform upper body dressing with assistance level of:  Supervision/Verbal cueing  Not including orthosis Goal: LTG Patient will perform lower body dressing w/assist (OT) Description: LTG: Patient will perform lower body dressing with assist, with/without cues in positioning using equipment (OT) Flowsheets (Taken 02/12/2022 1704) LTG: Pt will perform lower body dressing with assistance level of: Minimal Assistance - Patient > 75%    Problem: RH Toileting Goal: LTG Patient will perform toileting task (3/3 steps) with assistance level (OT) Description: LTG: Patient will perform toileting task (3/3 steps) with assistance level (OT)  Flowsheets (Taken 02/12/2022 1704) LTG: Pt will perform toileting task (3/3 steps) with assistance level: Supervision/Verbal cueing   Problem: RH Functional Use of Upper Extremity Goal: LTG Patient will use RT/LT upper extremity as a (OT) Description: LTG: Patient will use right/left upper extremity as a stabilizer/gross assist/diminished/nondominant/dominant level with assist, with/without cues during functional activity (OT) Flowsheets (Taken 02/12/2022 1704) LTG: Use of upper extremity in functional activities:  RUE as dominant level  LUE as dominant level LTG: Pt will use upper extremity in functional activity with assistance level of: Supervision/Verbal cueing   Problem: RH Simple Meal Prep Goal: LTG Patient will perform simple meal prep w/assist (OT) Description: LTG: Patient will perform simple meal prep with assistance, with/without cues (OT). Flowsheets (Taken 02/12/2022 1704) LTG: Pt will perform simple meal prep with assistance level of: Minimal Assistance - Patient > 75% LTG: Pt will perform simple meal prep w/level of: Ambulate with device   Problem: RH Toilet Transfers Goal: LTG Patient will perform toilet transfers w/assist (OT) Description: LTG: Patient will perform toilet transfers with assist, with/without cues using equipment (OT) Flowsheets (Taken 02/12/2022 1704) LTG: Pt will perform toilet transfers with assistance level of: Supervision/Verbal cueing   Problem: RH Tub/Shower Transfers Goal: LTG Patient will perform tub/shower transfers w/assist (OT) Description: LTG: Patient will perform tub/shower transfers with assist, with/without cues using equipment (OT) Flowsheets (Taken 02/12/2022 1704) LTG: Pt will perform tub/shower stall transfers with assistance level of:  Supervision/Verbal cueing LTG: Pt will perform tub/shower transfers from: Walk in shower

## 2022-02-12 NOTE — Progress Notes (Signed)
[place] for Enoxaparin Indication:  VTE  Allergies  Allergen Reactions   Atorvastatin Other (See Comments)    Muscle weakness   Codeine Nausea Only   Ezetimibe Other (See Comments)    Muscle weakness   Pitavastatin Other (See Comments)    Muscle weakness   Pravastatin Other (See Comments)    Weakness    Rosuvastatin Other (See Comments)    Muscle weakness    Patient Measurements: Height: '5\' 4"'$  (162.6 cm) Weight: 87.2 kg (192 lb 3.9 oz) IBW/kg (Calculated) : 54.7   Vital Signs: Temp: 97.7 F (36.5 C) (06/08 1424) Temp Source: Oral (06/08 1424) BP: 142/76 (06/08 1424) Pulse Rate: 91 (06/08 1424)  Labs: Recent Labs    02/11/22 1939 02/12/22 0514  HGB 9.5* 8.7*  HCT 31.2* 27.8*  PLT 325 341  CREATININE 0.77 0.73    Estimated Creatinine Clearance: 62.9 mL/min (by C-G formula based on SCr of 0.73 mg/dL).   Medical History: Past Medical History:  Diagnosis Date   Hypertension    Psoriasis       Assessment: 76 year old right-handed female with medical history significant for hypertension, and psoriasis who was admitted following a fall. US doppler from 6/08 shows new  acute vein thrombosis in the right small saphenous vein. Pharmacy consulted to manage therapeutic enoxaparin.  No AC prior to admission. Patient has remained on prophylactic enoxaparin dosing ('40mg'$ ) during inpatient admission. Will now transition to therapeutic dosing. CBC stable, no issues with bleeding reported.   Goal of Therapy:  Monitor platelets by anticoagulation protocol: Yes   Plan:   Stop enoxaparin '40mg'$  SQ QD  Start enoxaparin '90mg'$  SQ BID Continue to monitor H&H and platelets    Thank you for allowing pharmacy to be a part of this patient's care.  Ardyth Harps, PharmD Clinical Pharmacist

## 2022-02-12 NOTE — Progress Notes (Signed)
Inpatient Rehabilitation  Patient information reviewed and entered into eRehab system by Mykala Mccready M. Hailie Searight, M.A., CCC/SLP, PPS Coordinator.  Information including medical coding, functional ability and quality indicators will be reviewed and updated through discharge.    

## 2022-02-12 NOTE — Evaluation (Signed)
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[place]) Active Problems:   Pressure injury of skin  Past Medical History:  Past Medical History:  Diagnosis Date   Hypertension    Psoriasis    Past Surgical History:  Past Surgical History:  Procedure Laterality Date   ABDOMINAL HYSTERECTOMY     ANTERIOR CERVICAL DECOMP/DISCECTOMY FUSION N/A 02/02/2022   Procedure: CERVICAL FOUR CORPECTOMY WITH PLATE REMOVAL;  Surgeon: Vallarie Mare, MD;  Location: [facility];  Service: Neurosurgery;  Laterality: N/A;   BACK SURGERY      Assessment / Plan / Recommendation Clinical Impression  Shelby Fleming is a 76 year old right-handed female with history of hypertension, psoriasis as well as prior history ACDF.  Per chart review patient lives alone.  1 level home with 2 steps to entry.  Independent prior to admission.  Daughter plans to provide assistance as needed on discharge.  Presented 01/31/2022 after mechanical fall.  By report patient tripped and fell into her storm door on her left side with severe dysesthesias to all 4 extremities.  No loss of consciousness.  Denied any syncope associated with the fall.  Cranial CT scan negative.  Bilateral hip and pelvis films negative.  CT cervical spine showed fracture through the C4 vertebral body in the inferior anterior corner at C3.  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  MRI cervical spine again known C4 body and C3 anterior inferior corner fractures.  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  No visible injury to the posterior elements.  C4-5 cord flattening primarily from central  disc protrusion.  Milder degenerative spinal stenosis C3-4.  By foraminal impingement at C3-4 and C4-5.  Left foraminal impingement of C7-T1 as well as C5-C7 ACDF.  Underwent C4 corpectomy, anterior interbody technique including discectomies and bilateral foraminotomies.  Placement of corpectomy cage at C4.  Placement of anterior instrumentation consisting of cervical plate and screws I4-3-3 02/02/2022 per Dr. Duffy Rhody.  She was placed in a cervical hard collar and was able to remove for showering.  She was cleared to begin Lovenox for DVT prophylaxis 02/03/2022.  Her diet has been advanced to a regular consistency.  She did have some urinary retention suspect neurogenic bladder initially placed on Flomax since discontinued with lattest bladder scan 451 ml  and refused intermittent catheters patient considering Foley to.  Therapy evaluations completed due to patient decreased functional mobility was admitted for a comprehensive rehab program. Currently complains of pain in hand joints.   Pt presents with mild dysphagia due to recent neck surgery and cervical precautions. Oral motor exam indicated no acute findings. Pt expressed fatigue masticating and swallowing regular textures for an entire meal. Pt consumed regular textures, dys 3 textures and thin liquids via straw during BSE. Pt naturally consumed small bites due to caution, slight increased mastication time, good oral clearance, swallow appeared timely and no overt s/s aspiration. Pt requested dys 3 texture diet verse regular diet at this time due to energy conversation. Pt also supports difficulties with large pills. SLP recommends dys 3 textures and thin liquid diet with medication whole with thin liquids, large pills whole in puree with  intermittent supervision A. Pt is allowed to have regular textures snack and SLP will monitor tolerance for a short period of time. Pt and pt's daughter support, cognitive and speech baseline. Pt would benefit from  skilled ST services in order to maximize functional independence and reduce burden of care, no further ST services at discharge.    Skilled Therapeutic Interventions          Skilled ST services focused on swallow skills. SLP provided dys 3 texture handout and swallow strategies to converse energy. SLP and pt collaborated to set goals for swallow needs during length of stay. All questions answered to satisfaction. Pt was left in room with daughter, call bell within reach and c hair alarm set. SLP recommends to continue skilled services.    SLP Assessment  Patient will need skilled Speech Lanaguage Pathology Services during CIR admission    Recommendations  SLP Diet Recommendations: Dysphagia 3 (Mech soft);Thin Liquid Administration via: Straw;Cup Medication Administration: Whole meds with liquid (large pills whole in puree) Supervision: Intermittent supervision to cue for compensatory strategies;Patient able to self feed Compensations: Slow rate;Small sips/bites;Minimize environmental distractions Postural Changes and/or Swallow Maneuvers: Seated upright 90 degrees Oral Care Recommendations: Oral care BID Patient destination: Home Follow up Recommendations: None Equipment Recommended: None recommended by SLP    SLP Frequency 1 to 3 out of 7 days   SLP Duration  SLP Intensity  SLP Treatment/Interventions 7-10 days  Minumum of 1-2 x/day, 30 to 90 minutes  Dysphagia/aspiration precaution training;Patient/family education    Pain Pain Assessment Pain Scale: 0-10 Pain Score: 6  Pain Type: Acute pain Pain Location: Generalized  Prior Functioning Cognitive/Linguistic Baseline: Within functional limits Type of Home: House  Lives With: Alone Available Help at Discharge: Family;Available 24 hours/day (Daughter is planning to move in temporarily to assist.)  SLP Evaluation Cognition Overall Cognitive Status: Within Functional Limits for tasks assessed (daughter supports baseline  cognition) Arousal/Alertness: Awake/alert Orientation Level: Oriented X4 Memory: Appears intact Awareness: Appears intact Safety/Judgment: Appears intact  Comprehension Auditory Comprehension Overall Auditory Comprehension: Appears within functional limits for tasks assessed Expression Expression Primary Mode of Expression: Verbal Verbal Expression Overall Verbal Expression: Appears within functional limits for tasks assessed Written Expression Dominant Hand: Right Oral Motor Oral Motor/Sensory Function Overall Oral Motor/Sensory Function: Within functional limits Motor Speech Overall Motor Speech: Appears within functional limits for tasks assessed  Care Tool Care Tool Cognition Ability to hear (with hearing aid or hearing appliances if normally used Ability to hear (with hearing aid or hearing appliances if normally used): 1. Minimal difficulty - difficulty in some environments (e.g. when person speaks softly or setting is noisy)   Expression of Ideas and Wants Expression of Ideas and Wants: 4. Without difficulty (complex and basic) - expresses complex messages without difficulty and with speech that is clear and easy to understand   Understanding Verbal and Non-Verbal Content Understanding Verbal and Non-Verbal Content: 4. Understands (complex and basic) - clear comprehension without cues or repetitions  Memory/Recall Ability Memory/Recall Ability : Staff names and faces;That he or she is in a hospital/hospital unit;Current season   PMSV Assessment  PMSV Trial    Bedside Swallowing Assessment General Diet Prior to this Study: Regular;Thin liquids Behavior/Cognition: Alert;Cooperative;Pleasant mood Oral Cavity - Dentition: Adequate natural dentition Self-Feeding Abilities: Able to feed self Patient Positioning: Upright in bed Baseline Vocal Quality: Normal Volitional Cough: Strong Volitional Swallow: Able to elicit  Oral Care Assessment Does patient have any of the  following "high(er) risk" factors?: None  of the above Does patient have any of the following "at risk" factors?: None of the above Patient is LOW RISK: Follow universal precautions (see row information) Ice Chips Ice chips: Not tested Thin Liquid Thin Liquid: Within functional limits Presentation: Self Fed;Straw Nectar Thick Nectar Thick Liquid: Not tested Honey Thick Honey Thick Liquid: Not tested Puree Puree: Not tested Solid Solid: Within functional limits BSE Assessment Suspected Esophageal Findings Suspected Esophageal Findings: Belching Risk for Aspiration Impact on safety and function: Mild aspiration risk  Short Term Goals: Week 1: SLP Short Term Goal 1 (Week 1): STG=LTG due to short ELOS  Refer to Care Plan for Long Term Goals  Recommendations for other services: None   Discharge Criteria: Patient will be discharged from SLP if patient refuses treatment 3 consecutive times without medical reason, if treatment goals not met, if there is a change in medical status, if patient makes no progress towards goals or if patient is discharged from hospital.  The above assessment, treatment plan, treatment alternatives and goals were discussed and mutually agreed upon: by patient and by family  Shelby Fleming  [facility] 02/12/2022, 4:34 PM

## 2022-02-13 DIAGNOSIS — S14129A Central cord syndrome at unspecified level of cervical spinal cord, initial encounter: Secondary | ICD-10-CM | POA: Diagnosis not present

## 2022-02-13 LAB — BASIC METABOLIC PANEL
Anion gap: 7 (ref 5–15)
BUN: <5 mg/dL — ABNORMAL LOW (ref 8–23)
CO2: 27 mmol/L (ref 22–32)
Calcium: 8.8 mg/dL — ABNORMAL LOW (ref 8.9–10.3)
Chloride: 103 mmol/L (ref 98–111)
Creatinine, Ser: 0.75 mg/dL (ref 0.44–1.00)
GFR, Estimated: >60 mL/min (ref >60)
Glucose, Bld: 104 mg/dL — ABNORMAL HIGH (ref 70–99)
Potassium: 4.2 mmol/L (ref 3.5–5.1)
Sodium: 137 mmol/L (ref 135–145)

## 2022-02-13 MED ORDER — SORBITOL 70 % SOLN
30.0000 mL | Freq: Once | Status: AC
  Administered 2022-02-13: 30 mL via ORAL
  Filled 2022-02-13: qty 30

## 2022-02-13 MED ORDER — ENSURE ENLIVE PO LIQD
237.0000 mL | Freq: Two times a day (BID) | ORAL | Status: DC
  Administered 2022-02-13 – 2022-02-19 (×6): 237 mL via ORAL

## 2022-02-13 MED ORDER — BIOTENE DRY MOUTH MT LIQD: 15.0000 mL | OROMUCOSAL | Status: DC | PRN

## 2022-02-13 NOTE — Consult Note (Addendum)
[facility] Nurse Consult Note: Assistance requested for bilat breast skin folds.  Performed remotely after review of progress notes. Skin is described as red, moist and macerated. Appearance is consistent with intertrigo.  Interdry silver-impregnated fabric ordered for use by bedside nurses and instructions provided.  This product should remain in place for 5 days for optimal plan of care to provide antimicrobial benefits and wick moisture away from skin.   ICD-10 CM Codes for Irritant Dermatitis L24A0 - Due to friction or contact with body fluids; unspecified  Dressing procedure/placement/frequency: Topical treatment orders provided for bedside nurses to perform as follows: Interdry silver-impregnated fabric ordered for use by bedside nurses and instructions provided.  This product should remain in place for 5 days for optimal plan of care to provide antimicrobial benefits and wick moisture away from skin.  Please re-consult if further assistance is needed.  Thank-you,  Julien Girt MSN, [place], [place], [place], [place]

## 2022-02-13 NOTE — Progress Notes (Signed)
PROGRESS NOTE   Subjective/Complaints:  Had a productive day-  LBM 2-3 days ago.  Peeing well.  No cathing required.  Throat dry- No thrush.  Asking about if needs imaging of RUE or neck since RUE still weak.   ROS:  Pt denies SOB, abd pain, CP, N/V/C/D, and vision changes Has rash underneath breasts.  Objective:   VAS [place] LOWER EXTREMITY VENOUS (DVT)  Result Date: 02/12/2022  Lower Venous DVT Study Patient Name:  JAWANDA PASSEY  Date of Exam:   02/12/2022 Medical Rec #: [id number]    Accession #:    [id number] Date of Birth: Jan 26, 1946     Patient Gender: F Patient Age:   76 years Exam Location:  [facility] Procedure:      VAS [place] LOWER EXTREMITY VENOUS (DVT) Referring Phys: Lauraine Rinne --------------------------------------------------------------------------------  Indications: Swelling.  Risk Factors: None identified. Limitations: Poor ultrasound/tissue interface and patient positioning. Comparison Study: No prior studies. Performing Technologist: Oliver Hum RVT  Examination Guidelines: A complete evaluation includes B-mode imaging, spectral Doppler, color Doppler, and power Doppler as needed of all accessible portions of each vessel. Bilateral testing is considered an integral part of a complete examination. Limited examinations for reoccurring indications may be performed as noted. The reflux portion of the exam is performed with the patient in reverse Trendelenburg.  +---------+---------------+---------+-----------+----------+--------------+ RIGHT    CompressibilityPhasicitySpontaneityPropertiesThrombus Aging +---------+---------------+---------+-----------+----------+--------------+ CFV      Full           Yes      Yes                                 +---------+---------------+---------+-----------+----------+--------------+ SFJ      Full                                                         +---------+---------------+---------+-----------+----------+--------------+ FV Prox  Full                                                        +---------+---------------+---------+-----------+----------+--------------+ FV Mid   Full                                                        +---------+---------------+---------+-----------+----------+--------------+ FV Distal               Yes      Yes                                 +---------+---------------+---------+-----------+----------+--------------+ PFV      Full                                                        +---------+---------------+---------+-----------+----------+--------------+  POP      Full           Yes      Yes                                 +---------+---------------+---------+-----------+----------+--------------+ PTV      Full                                                        +---------+---------------+---------+-----------+----------+--------------+ PERO     Full                                                        +---------+---------------+---------+-----------+----------+--------------+ SSV      None                                         Acute          +---------+---------------+---------+-----------+----------+--------------+ Thrombus detected in the short saphenous vein is noted to be approximately 1 cm from the confluence with the popliteal vein.  +---------+---------------+---------+-----------+----------+--------------+ LEFT     CompressibilityPhasicitySpontaneityPropertiesThrombus Aging +---------+---------------+---------+-----------+----------+--------------+ CFV      Full           Yes      Yes                                 +---------+---------------+---------+-----------+----------+--------------+ SFJ      Full                                                        +---------+---------------+---------+-----------+----------+--------------+  FV Prox  Full                                                        +---------+---------------+---------+-----------+----------+--------------+ FV Mid   Full                                                        +---------+---------------+---------+-----------+----------+--------------+ FV DistalFull                                                        +---------+---------------+---------+-----------+----------+--------------+ PFV      Full                                                        +---------+---------------+---------+-----------+----------+--------------+  POP      Full           Yes      Yes                                 +---------+---------------+---------+-----------+----------+--------------+ PTV      Full                                                        +---------+---------------+---------+-----------+----------+--------------+ PERO     Full                                                        +---------+---------------+---------+-----------+----------+--------------+     Summary: RIGHT: - Findings consistent with acute superficial vein thrombosis involving the right small saphenous vein. - There is no evidence of deep vein thrombosis in the lower extremity.  - No cystic structure found in the popliteal fossa. - Thrombus detected in the short saphenous vein is noted to be approximately 1 cm from the confluence with the popliteal vein.  LEFT: - There is no evidence of deep vein thrombosis in the lower extremity.  - No cystic structure found in the popliteal fossa.  *See table(s) above for measurements and observations. Electronically signed by [name] MD on 02/12/2022 at 4:45:02 PM.    Final    Recent Labs    02/11/22 1939 02/12/22 0514  WBC 6.6 6.1  HGB 9.5* 8.7*  HCT 31.2* 27.8*  PLT 325 341   Recent Labs    02/12/22 0514 02/13/22 0554  NA 136 137  K 2.9* 4.2  CL 99 103  CO2 26 27  GLUCOSE 100* 104*  BUN 7*  <5*  CREATININE 0.73 0.75  CALCIUM 8.5* 8.8*    Intake/Output Summary (Last 24 hours) at 02/13/2022 1244 Last data filed at 02/13/2022 7893 Gross per 24 hour  Intake 597 ml  Output --  Net 597 ml     Pressure Injury 02/11/22 Buttocks Right Stage 2 -  Partial thickness loss of dermis presenting as a shallow open injury with a red, pink wound bed without slough. stage 2 right side along bony prominence - verified by Eyvonne Mechanic, RN (Active)  02/11/22 1830  Location: Buttocks  Location Orientation: Right  Staging: Stage 2 -  Partial thickness loss of dermis presenting as a shallow open injury with a red, pink wound bed without slough.  Wound Description (Comments): stage 2 right side along bony prominence - verified by Eyvonne Mechanic, RN  Present on Admission: Yes    Physical Exam: Vital Signs Blood pressure 136/60, pulse 91, temperature 99.1 F (37.3 C), resp. rate 16, height '5\' 4"'$  (1.626 m), weight 87.2 kg, SpO2 94 %.    General: awake, alert, appropriate, sitting up in bed; daughter at bedside; NAD HENT: conjugate gaze; oropharynx moist- wearing cervical collar- no thrush CV: regular rate; no JVD Pulmonary: CTA B/L; no W/R/R- good air movement GI: soft, NT, ND, (+)BS Psychiatric: appropriate Neurological: Ox3 Skin: Incision site clean and dry.has some psoriasis Bad: MASD- is open under breasts Neuro:  Patient is alert.  Oriented x3  and follows commands.  Musculoskeletal: Hard cervical collar in place, arthritis of small hand joints. RUE 4/5, LUE 3/5, 4/5 in lower extremities b/l  Assessment/Plan: 1. Functional deficits which require 3+ hours per day of interdisciplinary therapy in a comprehensive inpatient rehab setting. Physiatrist is providing close team supervision and 24 hour management of active medical problems listed below. Physiatrist and rehab team continue to assess barriers to discharge/monitor patient progress toward functional and medical goals  Care Tool:  Bathing     Body parts bathed by patient: Right arm, Left arm, Chest, Abdomen, Front perineal area, Right upper leg, Left upper leg, Face   Body parts bathed by helper: Left lower leg, Right lower leg, Buttocks     Bathing assist Assist Level: Maximal Assistance - Patient 24 - 49%     Upper Body Dressing/Undressing Upper body dressing   What is the patient wearing?: Bra, Pull over shirt, Orthosis Orthosis activity level: Performed by helper  Upper body assist Assist Level: Maximal Assistance - Patient 25 - 49%    Lower Body Dressing/Undressing Lower body dressing      What is the patient wearing?: Underwear/pull up, Pants     Lower body assist Assist for lower body dressing: Maximal Assistance - Patient 25 - 49%     Toileting Toileting    Toileting assist Assist for toileting: Maximal Assistance - Patient 25 - 49%     Transfers Chair/bed transfer  Transfers assist     Chair/bed transfer assist level: Minimal Assistance - Patient > 75%     Locomotion Ambulation   Ambulation assist      Assist level: Contact Guard/Touching assist Assistive device: Walker-rolling Max distance: 200 ft   Walk 10 feet activity   Assist     Assist level: Contact Guard/Touching assist Assistive device: Walker-rolling   Walk 50 feet activity   Assist Walk 50 feet with 2 turns activity did not occur: Safety/medical concerns  Assist level: Contact Guard/Touching assist Assistive device: Walker-rolling    Walk 150 feet activity   Assist Walk 150 feet activity did not occur: Safety/medical concerns  Assist level: Contact Guard/Touching assist Assistive device: Walker-rolling    Walk 10 feet on uneven surface  activity   Assist Walk 10 feet on uneven surfaces activity did not occur: Safety/medical concerns         Wheelchair     Assist Is the patient using a wheelchair?: No Type of Wheelchair: Manual    Wheelchair assist level: Dependent - Patient 0%       Wheelchair 50 feet with 2 turns activity    Assist        Assist Level: Dependent - Patient 0%   Wheelchair 150 feet activity     Assist      Assist Level: Dependent - Patient 0%   Blood pressure 136/60, pulse 91, temperature 99.1 F (37.3 C), resp. rate 16, height '5\' 4"'$  (1.626 m), weight 87.2 kg, SpO2 94 %.  Medical Problem List and Plan: 1. 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  Cervical collar as directed             -patient may shower             -ELOS/Goals: 5-7 days S             -Con't CIR- PT and OT- educated pt RUE  weakness is due to SCI- and I don't think needs additional work up right now 2.  Antithrombotics: -DVT/anticoagulation:  Pharmaceutical: Lovenox check vascular study             -antiplatelet therapy: N/A 3. Postoperative pain: continue Tramadol 50 mg every 6 hours, Neurontin 100 mg twice daily, Flexeril 10 mg 3 times daily as needed, oxycodone as needed  6/8- pain controlled with scheduled tramadol 4. Mood: Provide emotional support             -antipsychotic agents: N/A 5. Neuropsych: This patient is capable of making decisions on her own behalf. 6. Skin/Wound Care: Routine skin checks 7. Fluids/Electrolytes/Nutrition: Routine in and outs with follow-up chemistries 8.  Neurogenic bowel and bladder.  Check PVR.  Patient had initially refused intermittent catheterization.  Establish bowel program  6/8- will order bladder scans to see if retaining still- not clear if needs bowel program quite yet.   6/9- is voiding- will con't to monitor to see if needs caths 9.  Dysphagia.  Improved.  Diet advanced to regular 10.  Hypertension.  Patient on lisinopril 40 mg daily prior to admission.  Resume as needed  6/8- BP well controlled off Lisinopril 11.   Hyperlipidemia.  Restart Lovaza 2 g twice daily 12.  Psoriasis.  Restart Oteleza 30 mg twice daily  6/8- will send Otezla and cream to pharmacy so can use.  13. 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 Hypokalemia  6/8- K+ 2.9- will give KCL 40 mEq x3 and recheck in AM- was last 3.7  6/9- K+ 4.2 this AM- will recheck Monday 15. MASD under breasts  6/9- will consult WOC for recommendations- is severe. With open skin 16. Constipation  6/9- Will order sorbitol after therapy.  17. Moderate malnutrition  6/9- will add Ensure BID between meals 18. Dry mouth  6/9- doesn't have thrush on exam- will order Biotene mouthwash  I spent a total of  38  minutes on total care today- >50% coordination of care- due to 2 visits to pt- d/w daughter and d/w nursing   LOS: 2 days A FACE TO FACE EVALUATION WAS PERFORMED  Clare Fennimore 02/13/2022, 12:44 PM

## 2022-02-13 NOTE — Progress Notes (Signed)
Patient ID: Shelby Fleming, female   DOB: 23-Jul-1946, 76 y.o.   MRN: [id number]  SW spoke with pt dtr [name] to inform on ELOS and will f/u after team conference on Tuesday with more updates.   [name], MSW, [place] Office: [phone] Cell: [phone] Fax: [phone]

## 2022-02-13 NOTE — Care Management (Signed)
[place] Individual Statement of Services  Patient Name:  Shelby Fleming  Date:  02/13/2022  Welcome to the [facility].  Our goal is to provide you with an individualized program based on your diagnosis and situation, designed to meet your specific needs.  With this comprehensive rehabilitation program, you will be expected to participate in at least 3 hours of rehabilitation therapies Monday-Friday, with modified therapy programming on the weekends.  Your rehabilitation program will include the following services:  Physical Therapy (PT), Occupational Therapy (OT), Speech Therapy (ST), 24 hour per day rehabilitation nursing, Therapeutic Recreaction (TR), Psychology, Neuropsychology, Care Coordinator, Rehabilitation Medicine, [place], and Other  Weekly team conferences will be held on Tuesdays to discuss your progress.  Your Inpatient Rehabilitation Care Coordinator will talk with you frequently to get your input and to update you on team discussions.  Team conferences with you and your family in attendance may also be held.  Expected length of stay: 10-14 days   Overall anticipated outcome: Supervision   Depending on your progress and recovery, your program may change. Your Inpatient Rehabilitation Care Coordinator will coordinate services and will keep you informed of any changes. Your Inpatient Rehabilitation Care Coordinator's name and contact numbers are listed  below.  The following services may also be recommended but are not provided by the [facility] will be made to provide these services after discharge if needed.  Arrangements include referral to agencies that provide these services.  Your insurance has been verified to be:  [facility] [employer]  Your primary doctor  is:  London Pepper  Pertinent information will be shared with your doctor and your insurance company.  Inpatient Rehabilitation Care Coordinator:  Cathleen Corti [phone] or ([phone]  Information discussed with and copy given to patient by: Rana Snare, 02/13/2022, 9:12 AM

## 2022-02-13 NOTE — Progress Notes (Signed)
Speech Language Pathology Daily Session Note  Patient Details  Name: Shelby Fleming MRN: [id number] Date of Birth: Feb 01, 1946  Today's Date: 02/13/2022 SLP Individual Time: 1300-1400 SLP Individual Time Calculation (min): 60 min  Short Term Goals: Week 1: SLP Short Term Goal 1 (Week 1): STG=LTG due to short ELOS  Skilled Therapeutic Interventions: Pt seen for skilled ST with focus on swallowing goals, pt sitting EOB after using BSC with NT and requesting to transfer to wheelchair for increased back support. SLP facilitating transfer by providing min A cues for safety and recall. Pt consumed <25% of lunch meal this date, continues to endorse mastication fatigue and limited appetite however reports decreased pain in throat with swallow. Pt's biggest complaint at this time is dry mouth and throat impacting swallow function, MD aware and pt reports someone bringing her Biotene to trial. Throughout session, pt consuming ~4 oz water via straw with no overt s/s aspiration. Pt reports increased tolerance with swallowing large pills at this time, is excited about her daily progress. Discussed ongoing swallow strategies and reviewed Dys 3 handout posted in room. Pt agreeable to remain Dys 3/thin at this time. Pt left in wheelchair with NT present for vitals, cont ST POC.  Pain Pain Assessment Pain Scale: 0-10 Pain Score: 0-No pain  Therapy/Group: Individual Therapy  Dewaine Conger 02/13/2022, 2:32 PM

## 2022-02-13 NOTE — Progress Notes (Signed)
Physical Therapy Session Note  Patient Details  Name: Shelby Fleming MRN: [id number] Date of Birth: [birth year]  Today's Date: 02/13/2022 PT Individual Time: 0800-0915 PT Individual Time Calculation (min): 75 min   Short Term Goals: Week 1:  PT Short Term Goal 1 (Week 1): Pt will transfer sup to sit w/ log roll w/ min A. PT Short Term Goal 2 (Week 1): Pt will transfer sit to stand w/ CGA. PT Short Term Goal 3 (Week 1): Pt will amb 100' w/ RW and CGA. PT Short Term Goal 4 (Week 1): Address stairs  Skilled Therapeutic Interventions/Progress Updates:    Pt sitting EOB upon arrival finishing breakfast. Transfers: sit<>stand min assist and rw with cues for hand placement. Performed from EOB and w/c level. Gait: 200 ft x2, 75 ft X1 - decreased stride length but no LOB - CGA for safety. Distance limited by fatigue. TFA: sit/stand from w/c with incorporation of UE tasks  - including bean bag toss and reaching for clothes pins at different distances and heights (assist with Rt UE as needed). Pt c/o buttock discomfort, changed w/c cushion to Roho with reports of improvement. Pt up in w/c after session with seatbelt alarm on and activated. Daughter present and all need in reach.   Therapy Documentation Precautions:  Precautions Precautions: Fall, Cervical Required Braces or Orthoses: Cervical Brace Cervical Brace: Hard collar, At all times Restrictions Weight Bearing Restrictions: No  Pain:  Pain 7/10 declines intervention, monitored throughout session.     Therapy/Group: Individual Therapy  [name] 02/13/2022, 11:10 AM

## 2022-02-13 NOTE — IPOC Note (Signed)
Overall Plan of Care [facility]) Patient Details Name: Shelby Fleming MRN: [id number] DOB: 1946/02/06  Admitting Diagnosis: Central cord syndrome [facility])  Hospital Problems: Principal Problem:   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[place]) Active Problems:   Pressure injury of skin     Functional Problem List: Nursing Edema, Endurance, Medication Management, Motor, Pain, Safety, Skin Integrity, Bladder, Bowel  PT Balance, Endurance, Motor  OT Motor, Pain  SLP    TR         Basic ADL's: OT Eating, Grooming, Bathing, Dressing, Toileting     Advanced  ADL's: OT Simple Meal Preparation     Transfers: PT Bed Mobility, Bed to Chair, Car, Manufacturing systems engineer, Metallurgist: PT Ambulation, Stairs     Additional Impairments: OT Fuctional Use of Upper Extremity  SLP Swallowing      TR      Anticipated Outcomes Item Anticipated Outcome  Self Feeding Independent  Swallowing  Mod I   Basic self-care  SBA upper body, Min A lower body  Toileting  SBA   Bathroom Transfers SBA  Bowel/Bladder  supervision  Transfers  supervision  Locomotion  supervision w/ LRAD  Communication     Cognition     Pain  < 3  Safety/Judgment  supervision   Therapy Plan: PT Intensity: Minimum of 1-2 x/day ,45 to 90 minutes PT Frequency: 5 out of 7 days PT Duration Estimated Length of Stay: 10-14 days OT Intensity: Minimum of 1-2 x/day, 45 to 90 minutes OT Frequency: 5 out of 7 days OT Duration/Estimated Length of Stay: 10-14 days SLP Intensity: Minumum of 1-2 x/day, 30 to 90 minutes SLP Frequency: 1 to 3 out of 7 days SLP Duration/Estimated Length of Stay: 7-10 days   Team Interventions: Nursing Interventions Patient/Family Education, Disease Management/Prevention, Pain Management, Medication Management, Skin Care/Wound Management, Discharge Planning  PT interventions Ambulation/gait training, Community reintegration, Stair training, UE/LE Strength taining/ROM, Discharge planning,  Training and development officer, Therapeutic Activities, UE/LE Coordination activities, Functional mobility training, Patient/family education, Therapeutic Exercise  OT Interventions Neuromuscular re-education, Self Care/advanced ADL retraining, Therapeutic Exercise, UE/LE Strength taining/ROM, Therapeutic Activities, Discharge planning, Patient/family education, UE/LE Coordination activities, Pain management, DME/adaptive equipment instruction, Community reintegration, Functional mobility training, Training and development officer  SLP Interventions Dysphagia/aspiration precaution training, Patient/family education  TR Interventions    SW/CM Interventions Discharge Planning, Psychosocial Support, Patient/Family Education   Barriers to Discharge MD  Medical stability, Home enviroment access/loayout, Neurogenic bowel and bladder, Wound care, Weight, and Weight bearing restrictions  Nursing Decreased caregiver support, Home environment access/layout, Wound Care, Lack of/limited family support, Weight bearing restrictions 1 level, 2 steps, no rails. Daughter to assist patient at discharge.  PT Home environment access/layout    OT      SLP      SW Decreased caregiver support, Lack of/limited family support     Team Discharge Planning: Destination: PT-Home ,OT- Home , SLP-Home Projected Follow-up: PT-Outpatient PT, OT-  Home health OT, SLP-None Projected Equipment Needs: PT-To be determined, OT- To be determined, SLP-None recommended by SLP Equipment Details: PT-most likely RW, OT-Pt reports that her Daughter has purchased a shower chair already. She owns a Secondary school teacher. Patient/family involved in discharge planning: PT- Patient, Family member/caregiver,  OT-Patient, SLP-Patient, Family member/caregiver  MD ELOS: `10-14 days Medical Rehab Prognosis:  Good Assessment: The patient has been admitted for CIR therapies with the diagnosis of incomplete quadriplegia/central cord syndrome. The team will be  addressing functional mobility, strength, stamina, balance, safety, adaptive techniques and equipment, self-care, bowel  and bladder mgt, patient and caregiver education, skin care/MASD and help with malnutrition. Goals have been set at [place]. Anticipated discharge destination is home.        See Team Conference Notes for weekly updates to the plan of care

## 2022-02-13 NOTE — Progress Notes (Signed)
Occupational Therapy Session Note  Patient Details  Name: Shelby Fleming MRN: [id number] Date of Birth: 1946-07-14  Today's Date: 02/13/2022 OT Individual Time: 1445-1535 OT Individual Time Calculation (min): 50 min    Short Term Goals: Week 1:  OT Short Term Goal 1 (Week 1): Patient will complete toilet transfer using RW with Min A OT Short Term Goal 2 (Week 1): Patient will increase BUE A/ROM to [facility] in order to increase her ability to complete upper body bathing and dressing with less difficulty. OT Short Term Goal 3 (Week 1): Patient will increase her ability to comolete upper and lower body bathing with Mod assist with use of AE as needed. OT Short Term Goal 4 (Week 1): Patient will demonstrate improved activity tolerance while completing morning self care routine for 60 minutes requiring only 2-3 rest breaks.  Skilled Therapeutic Interventions/Progress Updates:    S: Pt in bed upon therapy arrival. Reports that she has no pain at the moment. Requesting to use bathroom first and brush her teeth when finished with therapy session.   O: - Bed mobility: completed with HOB elevated, Min Assist provided  - Sit to stand performed using RW from seated on EOB with Mod assist.  - Pt ambulated into bathroom to transfer to toilet with Mod Assist. Pt's depends were changed and therapist provided Max assist, while patient attempted to assist with placing feet into pants and depends as able. Dependent for toilet hygiene due to BM.  - Pt ambulated with RW and Min Assist to sink and stood to wash hands. VC for sequencing tasks, active assist provided to reach and extend right arm towards soap dispenser.  - Pt then completed seated therapy ball stretches; shoulder flexion, 5X with min assist for form and technique. - AA/ROM: shoulder, seated, protraction, flexion (all with limited range and 1-2 finger min assist on the right for proper form and technique. - AA/ROM using wash cloth on table seated; RUE; pt  formed 5 rectangles in both directions.    A: Skilled OT session focused on ADL re-training while toileting, functional transfer in room environment, and grooming task at sink prior to completing BUE strengthening and ROM activities facilitating increased functional use of BUE during ADL tasks. Patient demonstrating decreased UB ROM and strength, decreased activity tolerance and endurance while following cervical precautions and required increased VC for form, technique, and sequencing. Pt very receptive of education during session and completed all activities requested. Rest breaks were provided as needed and activity tolerance was monitored during session.     P: Continue with BUE ROM. Attempt AA/ROM in a semi reclined position (recliner in room) for greater ROM.    Therapy Documentation Precautions:  Precautions Precautions: Fall, Cervical Required Braces or Orthoses: Cervical Brace Cervical Brace: Hard collar, At all times Restrictions Weight Bearing Restrictions: No  Pain: Pain Assessment Pain Scale: 0-10 Pain Score: 0-No pain  Therapy/Group: Individual Therapy  Ailene Ravel, OTR/L,CBIS  Supplemental OT - [facility] and [facility]  02/13/2022, 4:30 PM

## 2022-02-14 DIAGNOSIS — S14129A Central cord syndrome at unspecified level of cervical spinal cord, initial encounter: Secondary | ICD-10-CM | POA: Diagnosis not present

## 2022-02-14 NOTE — Progress Notes (Signed)
PROGRESS NOTE   Subjective/Complaints: Saw patient today at bedside with daughter.  Concerns today about swelling in ankles bilaterally left greater than right minimal swelling in the dorsum of the foot bilaterally no pain reported.  Very slight redness on dorsum of left foot skin intact without significant warmth or erythema.  Patient also reports that her her fish oil dose is not at home regimen as she takes 1 g twice a day.  ROS: Patient denies SOB, abdominal pain, chest pain, and/V/C/D, and vision changes patient does still have rash underneath breasts which she has been seen by [facility] nurse about.    Objective:   VAS [place] LOWER EXTREMITY VENOUS (DVT)  Result Date: 02/12/2022  Lower Venous DVT Study Patient Name:  Shelby Fleming  Date of Exam:   02/12/2022 Medical Rec #: [id number]    Accession #:    [id number] Date of Birth: 08/02/1946     Patient Gender: F Patient Age:   76 years Exam Location:  [facility] Procedure:      VAS [place] LOWER EXTREMITY VENOUS (DVT) Referring Phys: Lauraine Rinne --------------------------------------------------------------------------------  Indications: Swelling.  Risk Factors: None identified. Limitations: Poor ultrasound/tissue interface and patient positioning. Comparison Study: No prior studies. Performing Technologist: Oliver Hum RVT  Examination Guidelines: A complete evaluation includes B-mode imaging, spectral Doppler, color Doppler, and power Doppler as needed of all accessible portions of each vessel. Bilateral testing is considered an integral part of a complete examination. Limited examinations for reoccurring indications may be performed as noted. The reflux portion of the exam is performed with the patient in reverse Trendelenburg.  +---------+---------------+---------+-----------+----------+--------------+ RIGHT    CompressibilityPhasicitySpontaneityPropertiesThrombus Aging  +---------+---------------+---------+-----------+----------+--------------+ CFV      Full           Yes      Yes                                 +---------+---------------+---------+-----------+----------+--------------+ SFJ      Full                                                        +---------+---------------+---------+-----------+----------+--------------+ FV Prox  Full                                                        +---------+---------------+---------+-----------+----------+--------------+ FV Mid   Full                                                        +---------+---------------+---------+-----------+----------+--------------+ FV Distal               Yes  Yes                                 +---------+---------------+---------+-----------+----------+--------------+ PFV      Full                                                        +---------+---------------+---------+-----------+----------+--------------+ POP      Full           Yes      Yes                                 +---------+---------------+---------+-----------+----------+--------------+ PTV      Full                                                        +---------+---------------+---------+-----------+----------+--------------+ PERO     Full                                                        +---------+---------------+---------+-----------+----------+--------------+ SSV      None                                         Acute          +---------+---------------+---------+-----------+----------+--------------+ Thrombus detected in the short saphenous vein is noted to be approximately 1 cm from the confluence with the popliteal vein.  +---------+---------------+---------+-----------+----------+--------------+ LEFT     CompressibilityPhasicitySpontaneityPropertiesThrombus Aging +---------+---------------+---------+-----------+----------+--------------+  CFV      Full           Yes      Yes                                 +---------+---------------+---------+-----------+----------+--------------+ SFJ      Full                                                        +---------+---------------+---------+-----------+----------+--------------+ FV Prox  Full                                                        +---------+---------------+---------+-----------+----------+--------------+ FV Mid   Full                                                        +---------+---------------+---------+-----------+----------+--------------+  FV DistalFull                                                        +---------+---------------+---------+-----------+----------+--------------+ PFV      Full                                                        +---------+---------------+---------+-----------+----------+--------------+ POP      Full           Yes      Yes                                 +---------+---------------+---------+-----------+----------+--------------+ PTV      Full                                                        +---------+---------------+---------+-----------+----------+--------------+ PERO     Full                                                        +---------+---------------+---------+-----------+----------+--------------+     Summary: RIGHT: - Findings consistent with acute superficial vein thrombosis involving the right small saphenous vein. - There is no evidence of deep vein thrombosis in the lower extremity.  - No cystic structure found in the popliteal fossa. - Thrombus detected in the short saphenous vein is noted to be approximately 1 cm from the confluence with the popliteal vein.  LEFT: - There is no evidence of deep vein thrombosis in the lower extremity.  - No cystic structure found in the popliteal fossa.  *See table(s) above for measurements and observations. Electronically  signed by [name] MD on 02/12/2022 at 4:45:02 PM.    Final    Recent Labs    02/11/22 1939 02/12/22 0514  WBC 6.6 6.1  HGB 9.5* 8.7*  HCT 31.2* 27.8*  PLT 325 341   Recent Labs    02/12/22 0514 02/13/22 0554  NA 136 137  K 2.9* 4.2  CL 99 103  CO2 26 27  GLUCOSE 100* 104*  BUN 7* <5*  CREATININE 0.73 0.75  CALCIUM 8.5* 8.8*    Intake/Output Summary (Last 24 hours) at 02/14/2022 1148 Last data filed at 02/13/2022 1427 Gross per 24 hour  Intake 117 ml  Output --  Net 117 ml     Pressure Injury 02/11/22 Buttocks Right Stage 2 -  Partial thickness loss of dermis presenting as a shallow open injury with a red, pink wound bed without slough. stage 2 right side along bony prominence - verified by Eyvonne Mechanic, RN (Active)  02/11/22 1830  Location: Buttocks  Location Orientation: Right  Staging: Stage 2 -  Partial thickness loss of dermis presenting as a shallow open injury with a red, pink wound bed without slough.  Wound Description (Comments): stage 2 right side along bony prominence - verified by Eyvonne Mechanic, RN  Present on Admission: Yes    Physical Exam: Vital Signs Blood pressure (!) 123/49, pulse 93, temperature 97.7 F (36.5 C), temperature source Oral, resp. rate 16, height '5\' 4"'$  (1.626 m), weight 87.2 kg, SpO2 93 %.   General: Alert and oriented x 3, No apparent distress HEENT: Head is normocephalic, atraumatic, PERRLA, EOMI, sclera anicteric, oral mucosa pink and moist, dentition intact, ext ear canals clear,  Neck: Supple without JVD or lymphadenopathy Note: patient has cervical collar in place.  No issues mouth was examined absent for thrush.  Patient is using Biotene for dry mouth. Heart: Reg rate and rhythm. No murmurs rubs or gallops Chest: CTA bilaterally without wheezes, rales, or rhonchi; no distress Abdomen: Soft, non-tender, non-distended, bowel sounds positive. Extremities: No clubbing, cyanosis, or edema. Pulses are 2+ Psych: Pt's affect is  appropriate. Pt is cooperative Skin: Incision site is clean dry and intact does have areas of psoriasis. Note: 1) Patient does have red moist macerated area under her bilateral breasts.  She has been seen by [facility] nurse with recommendations for silver impregnated fabric to be used for 5 days at a time.   2) patient also has +2 edema nonpitting bilateral ankles, with left greater than right, +1 nonpitting on dorsum both feet.  May be dependent edema. Neuro: Patient is alert.  Oriented x3 and follows commands well Musculoskeletal: She has hard cervical collar in place, arthritis of small hand joints.  RUE 4/5, LUE 3/5, 4/5 in lower extremities bilaterally.  Assessment/Plan: 1. Functional deficits which require 3+ hours per day of interdisciplinary therapy in a comprehensive inpatient rehab setting. Physiatrist is providing close team supervision and 24 hour management of active medical problems listed below. Physiatrist and rehab team continue to assess barriers to discharge/monitor patient progress toward functional and medical goals  Care Tool:  Bathing    Body parts bathed by patient: Right arm, Left arm, Chest, Abdomen, Front perineal area, Right upper leg, Left upper leg, Face   Body parts bathed by helper: Left lower leg, Right lower leg, Buttocks     Bathing assist Assist Level: Maximal Assistance - Patient 24 - 49%     Upper Body Dressing/Undressing Upper body dressing   What is the patient wearing?: Bra, Pull over shirt, Orthosis Orthosis activity level: Performed by helper  Upper body assist Assist Level: Maximal Assistance - Patient 25 - 49%    Lower Body Dressing/Undressing Lower body dressing      What is the patient wearing?: Underwear/pull up, Pants     Lower body assist Assist for lower body dressing: Maximal Assistance - Patient 25 - 49%     Toileting Toileting    Toileting assist Assist for toileting: Maximal Assistance - Patient 25 - 49%      Transfers Chair/bed transfer  Transfers assist     Chair/bed transfer assist level: Minimal Assistance - Patient > 75%     Locomotion Ambulation   Ambulation assist      Assist level: Contact Guard/Touching assist Assistive device: Walker-rolling Max distance: 200 ft   Walk 10 feet activity   Assist     Assist level: Contact Guard/Touching assist Assistive device: Walker-rolling   Walk 50 feet activity   Assist Walk 50 feet with 2 turns activity did not occur: Safety/medical concerns  Assist level: Contact Guard/Touching assist Assistive device: Walker-rolling    Walk 150 feet activity   Assist Walk  150 feet activity did not occur: Safety/medical concerns  Assist level: Contact Guard/Touching assist Assistive device: Walker-rolling    Walk 10 feet on uneven surface  activity   Assist Walk 10 feet on uneven surfaces activity did not occur: Safety/medical concerns         Wheelchair     Assist Is the patient using a wheelchair?: No Type of Wheelchair: Manual    Wheelchair assist level: Dependent - Patient 0%      Wheelchair 50 feet with 2 turns activity    Assist        Assist Level: Dependent - Patient 0%   Wheelchair 150 feet activity     Assist      Assist Level: Dependent - Patient 0%   Blood pressure (!) 123/49, pulse 93, temperature 97.7 F (36.5 C), temperature source Oral, resp. rate 16, height '5\' 4"'$  (1.626 m), weight 87.2 kg, SpO2 93 %.    Medical Problem List and Plan: 1. 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  Cervical collar as directed             -patient may shower             -ELOS/Goals: 5-7 days S             -Con't CIR- PT and OT- educated pt RUE weakness is due to SCI- and I don't think needs additional  work up right now 2.  Antithrombotics: -DVT/anticoagulation:  Pharmaceutical: Lovenox check vascular study             -antiplatelet therapy: N/A 3. Postoperative pain: continue Tramadol 50 mg every 6 hours, Neurontin 100 mg twice daily, Flexeril 10 mg 3 times daily as needed, oxycodone as needed             6/8- pain controlled with scheduled tramadol 6/10 pain is well controlled with current as needed's of tramadol. 4. Mood: Provide emotional support             -antipsychotic agents: N/A 5. Neuropsych: This patient is capable of making decisions on her own behalf. 6. Skin/Wound Care: Routine skin checks 7. Fluids/Electrolytes/Nutrition: Routine in and outs with follow-up chemistries 8.  Neurogenic bowel and bladder.  Check PVR.  Patient had initially refused intermittent catheterization.  Establish bowel program             6/8- will order bladder scans to see if retaining still- not clear if needs bowel program quite yet.              6/9- is voiding- will con't to monitor to see if needs caths 6/10 Patient is voiding okay without needing caths 9.  Dysphagia.  Improved.  Diet advanced to regular 10.  Hypertension.  Patient on lisinopril 40 mg daily prior to admission.  Resume as needed             6/8- BP well controlled off Lisinopril 6/10 no issues with blood pressure with patient being off lisinopril    02/14/2022    1:36 PM 02/14/2022    3:49 AM 02/13/2022    8:29 PM  Vitals with BMI  Systolic 540 086 761  Diastolic 53 49 58  Pulse 950 93 98    11.  Hyperlipidemia.  Restart Lovaza 2 g twice daily 12.  Psoriasis.  Restart Oteleza 30 mg twice daily  6/8- will send Otezla and cream to pharmacy so can use.  6/10 patient to continue Oteleza 30 mg BID 13. 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 Hypokalemia             6/8- K+ 2.9- will give KCL 40 mEq x3 and recheck in AM- was last 3.7             6/9- K+ 4.2 this AM- will recheck Monday            6/10 pending  Monday labs 15. MASD under breasts             6/9- will consult WOC for recommendations- is severe. With open skin 16. Constipation             6/9- Will order sorbitol after therapy.  6/10 Patient's last BM was today sorbitol worked well. 17. Moderate malnutrition             6/9- will add Ensure BID between meals 18. Dry mouth             6/9- doesn't have thrush on exam- will order Biotene mouthwash   6/10 continue use of Biotene 19.  Bilateral lower ankle edema 6/10 patient says that she has had edema that started on Thursday.  She says that it is mostly in the ankles wonders if it could be due to her psoriatic arthritis.  Some of the swelling on the anterior right ankle has subsided overall there is greater edema in the left ankle versus right there is no edema of the legs and +1 nonpitting edema the dorsal surface of both feet.  Edema is +2 nonpitting for the ankles.  Encourage use of elevation for suspected dependent edema if this does not work may also add on use of Ace wrap.     LOS: 3 days A FACE TO FACE EVALUATION WAS PERFORMED  Luetta Nutting 02/14/2022, 11:48 AM

## 2022-02-14 NOTE — Plan of Care (Signed)
  Problem: Consults Goal: RH SPINAL CORD INJURY PATIENT EDUCATION Description:  See Patient Education module for education specifics.  Outcome: Progressing Goal: Skin Care Protocol Initiated - if Braden Score 18 or less Description: If consults are not indicated, leave blank or document N/A Outcome: Progressing   Problem: SCI BOWEL ELIMINATION Goal: RH STG MANAGE BOWEL WITH ASSISTANCE Description: STG Manage Bowel with Supervision Assistance. Outcome: Progressing Goal: RH STG SCI MANAGE BOWEL WITH MEDICATION WITH ASSISTANCE Description: STG SCI Manage bowel with medication with supervision assistance. Outcome: Progressing   Problem: SCI BLADDER ELIMINATION Goal: RH STG MANAGE BLADDER WITH ASSISTANCE Description: STG Manage Bladder With Supervision Assistance Outcome: Progressing Goal: RH STG MANAGE BLADDER WITH MEDICATION WITH ASSISTANCE Description: STG Manage Bladder With Medication With Supervision Assistance. Outcome: Progressing   Problem: RH SKIN INTEGRITY Goal: RH STG MAINTAIN SKIN INTEGRITY WITH ASSISTANCE Description: STG Maintain Skin Integrity With Supervision Assistance. Outcome: Progressing Goal: RH STG ABLE TO PERFORM INCISION/WOUND CARE W/ASSISTANCE Description: STG Able To Perform Incision/Wound Care With Supervision Assistance. Outcome: Progressing   Problem: RH SAFETY Goal: RH STG ADHERE TO SAFETY PRECAUTIONS W/ASSISTANCE/DEVICE Description: STG Adhere to Safety Precautions With Cues and Reminders. Outcome: Not Progressing Note: Family transferring patient. Alarm to bed or chair off after being placed on. Goal: RH STG DECREASED RISK OF FALL WITH ASSISTANCE Description: STG Decreased Risk of Fall With Supervision Assistance. Outcome: Progressing   Problem: RH PAIN MANAGEMENT Goal: RH STG PAIN MANAGED AT OR BELOW PT'S PAIN GOAL Description: < 3 on a 0-10 pain scale. Outcome: Progressing   Problem: RH KNOWLEDGE DEFICIT SCI Goal: RH STG INCREASE KNOWLEDGE  OF SELF CARE AFTER SCI Description: Patient will demonstrate knowledge of self-care, medication/pain management, bowel/bladder management, skin/wound care with educational materials and handouts provided by staff independently at discharge. Outcome: Progressing

## 2022-02-15 DIAGNOSIS — S14129A Central cord syndrome at unspecified level of cervical spinal cord, initial encounter: Secondary | ICD-10-CM | POA: Diagnosis not present

## 2022-02-15 NOTE — Progress Notes (Signed)
Physical Therapy Session Note  Patient Details  Name: Shelby Fleming MRN: [id number] Date of Birth: 1946-06-07  Today's Date: 02/15/2022 PT Individual Time: 0800-0900 PT Individual Time Calculation (min): 60 min   Short Term Goals: Week 1:  PT Short Term Goal 1 (Week 1): Pt will transfer sup to sit w/ log roll w/ min A. PT Short Term Goal 2 (Week 1): Pt will transfer sit to stand w/ CGA. PT Short Term Goal 3 (Week 1): Pt will amb 100' w/ RW and CGA. PT Short Term Goal 4 (Week 1): Address stairs  Skilled Therapeutic Interventions/Progress Updates:     Patient in the w/c with her feet elevate on a stool and pillows with her daughter in the room upon PT arrival. Patient alert and agreeable to PT session. Patient denied pain during session.  Patient reported increased lower extremity edema over the past 2 days, noted to have ACE wraps from met heads to mid shin with increased edema above th wraps, patient reports the wraps were applied and worn over night. Patient with orders for TED hose, discussed use of TEDs with ACE wraps from the met heads to the tibial plateau for improved edema control, as patient did have reduced edema at the ankle, with LPN, who was in agreement. Applied B TEDs and 1x4" and 1x6" ACE wrap with placement described above with decreasing pressure distal to proximal to promote fluid flow. Educated patient and her daughter on purpose and technique for TED hose and ACE wrap application and appreciative of intervention. [name], NP rounded during session and observed and agreed with plan for edema management.   Retrieved B ELRs for her w/c to improved lower extremity elevation in sitting and fit them to patient during session.   Donned B tennis shoes with total A for energy conservation/time management, used her daughter's shoes for 0.5 size larger to fit over ACE wraps.  Therapeutic Activity: Transfers: Patient performed sit to/from stand from the w/c and toilet with min A-CGA using  RW. Provided verbal cues for hand placement and forward weight shift. Patient was continent of bladder during toileting, performed anterior peri-care with set-up assist and posterior peri-care with total A and lower body clothing management with min-mod A for pulling her brief up in the back. Patient stood without upper extremity support with clothing management with CGA for balance for safety.   Gait Training:  Patient ambulated ~15 feet to the bathroom and 340 feet using RW with CGA. Ambulated with decreased gait speed, decreased step length and height, narrow BOS, forward trunk lean, and downward head gaze. Provided verbal cues for erect posture and looking ahead, safe proximity to RW, and increased BOS and step height for safety and reduced fall risk.  Patient in w/c with B lower extremities elevated with ELRs and her daughter in the room at end of session with breaks locked and all needs within reach.   Therapy Documentation Precautions:  Precautions Precautions: Fall, Cervical Required Braces or Orthoses: Cervical Brace Cervical Brace: Hard collar, At all times Restrictions Weight Bearing Restrictions: No    Therapy/Group: Individual Therapy  Wonda Goodgame L Maggy Wyble PT, DPT  02/15/2022, 5:27 PM

## 2022-02-15 NOTE — Progress Notes (Signed)
Occupational Therapy Session Note  Patient Details  Name: Shelby Fleming MRN: [id number] Date of Birth: 1946-08-08  Today's Date: 02/15/2022 OT Individual Time: 4008-6761 OT Individual Time Calculation (min): 60 min    Short Term Goals: Week 1:  OT Short Term Goal 1 (Week 1): Patient will complete toilet transfer using RW with Min A OT Short Term Goal 2 (Week 1): Patient will increase BUE A/ROM to [facility] in order to increase her ability to complete upper body bathing and dressing with less difficulty. OT Short Term Goal 3 (Week 1): Patient will increase her ability to comolete upper and lower body bathing with Mod assist with use of AE as needed. OT Short Term Goal 4 (Week 1): Patient will demonstrate improved activity tolerance while completing morning self care routine for 60 minutes requiring only 2-3 rest breaks. Week 2:     Skilled Therapeutic Interventions/Progress Updates:    Patient seen this day for OT services to improve functional outcome for safe and independent transition to home. The pt completed bathing exercise seated at w/c LOF, the pt was able remove her over head top and bra with ModA and she was able to wash her UB with s/u assist. Patient indicated that she needed to void, she was able to transfer to the [facility] using the RW for the completion of a stand pivot transfer. The pt resume bathing exercise and was able to complete LB task in bathing  at [facility] and initial  verbal cues for how she approached the task with greater independence and safety.  The pt was able to donn her clothing items at the same LOF for overhead top, brief and shorts.  The pt ambulated to the sink area for completion of  grooming task with the w/c in close proximity, she was able to complete the grooming exercise with s/u assist and  additional time.  The pt returned to w/c LOF with her bedside table in place, her call light within reach and her alarm activated. The pt had no c/o pain this treatment session, her  familty was present throughout the session and received instruction on pt relaxation breathing and toilet transfers to improve safety and independence  for transition to home.    Therapy Documentation Precautions:  Precautions Precautions: Fall, Cervical Required Braces or Orthoses: Cervical Brace Cervical Brace: Hard collar, At all times Restrictions Weight Bearing Restrictions: No Therapy/Group: Individual Therapy  Yvonne Kendall 02/15/2022, 5:07 PM

## 2022-02-15 NOTE — Progress Notes (Addendum)
Physical Therapy Session Note  Patient Details  Name: Shelby Fleming MRN: [id number] Date of Birth: 1945-09-24  Today's Date: 02/15/2022 PT Individual Time: 1400 - 1450, 50 min     Short Term Goals: Week 1:  PT Short Term Goal 1 (Week 1): Pt will transfer sup to sit w/ log roll w/ min A. PT Short Term Goal 2 (Week 1): Pt will transfer sit to stand w/ CGA. PT Short Term Goal 3 (Week 1): Pt will amb 100' w/ RW and CGA. PT Short Term Goal 4 (Week 1): Address stairs  Skilled Therapeutic Interventions/Progress Updates:  Pt resting in bed.  She denied pain at rest, but stated that her R upper arm is tender to touch.  Bed mobility training, with HOB raised for pt comfort: rolling R/L x 3 each, log rolling. In sitting, L/R lateral leans, pushing up with ipsilateral hand, x 5 each. Use of bil LEs to help push body toward HOB.   Strengthening exs in R/L side lying: 5 x 1 L/R hip abduction with flexed knees and hips. Pt did not feel activation in gluteus medius, but in HS.   L side lying> sitting with min assist, plus bed features.  Sit> stand from raised bed, pushing up on bed bil, CGA.  Wt shifting R><L in standing with RW, to increase R/LLE stance control.   Gait training on level tile, x 210' with RW, CGA, multiple turns. Stand pivot transfer with CGa, RW from wc> bed.  At end of session, sit> supine with supervision and extra time.  Bed alarm set and needs at hand. Pillows placed per pt for comfort in bed. HOB elevated to 40 degrees.       Therapy Documentation Precautions:  Precautions Precautions: Fall, Cervical Required Braces or Orthoses: Cervical Brace Cervical Brace: Hard collar, At all times Restrictions Weight Bearing Restrictions: No       Therapy/Group: Individual Therapy  Summers Buendia 02/15/2022, 12:17 PM

## 2022-02-15 NOTE — Progress Notes (Signed)
Occupational Therapy Session Note  Patient Details  Name: Shelby Fleming MRN: [id number] Date of Birth: 27-Jul-1946  Today's Date: 02/15/2022 OT Individual Time: 1130-1200 OT Individual Time Calculation (min): 30 min    Short Term Goals: Week 1:  OT Short Term Goal 1 (Week 1): Patient will complete toilet transfer using RW with Min A OT Short Term Goal 2 (Week 1): Patient will increase BUE A/ROM to [facility] in order to increase her ability to complete upper body bathing and dressing with less difficulty. OT Short Term Goal 3 (Week 1): Patient will increase her ability to comolete upper and lower body bathing with Mod assist with use of AE as needed. OT Short Term Goal 4 (Week 1): Patient will demonstrate improved activity tolerance while completing morning self care routine for 60 minutes requiring only 2-3 rest breaks.  Therapy Documentation Precautions:  Precautions Precautions: Fall, Cervical Required Braces or Orthoses: Cervical Brace Cervical Brace: Hard collar, At all times Restrictions Weight Bearing Restrictions: No General:  Upon OT arrival, pt seated in w.c and reports no pain but soreness to the R upper arm. Pt agreeable to OT session. Therapist transported pt to therapy gym for time conservation. Pt sat at tabletop to retrieve letter magnets located to the R of pt with the R UE and placed them into at bucket located in front of pt one at a time to improve ROM and strength to the R UE. Pt with Min difficulty and requires increased time and 2 rest breaks to complete. Pt then completed sit>stand transfer with Supervision and stood with R UE support and crossed midline to retrieve large plastic pegs from bucket to R of pt and placed into foam peg board one at a time. Pt tolerates standing for ~10 minutes with Supervision and was able to remove pegs with the R UE one at a time to return to the bucket before task was ended.  No LOB observed. Pt completes stand>sit transfer with Supervision. Pt  limited by decreased balance, R UE strength and endurance, and overall weakness. Pt continues to benefit from OT services to maximize independence during functional tasks. Pt was transported back to her room via w/c and left in w/c with all needs met.   Therapy/Group: Individual Therapy  [name] 02/15/2022, 12:29 PM

## 2022-02-15 NOTE — Progress Notes (Signed)
PROGRESS NOTE   Subjective/Complaints: Saw patient today at bedside with daughter.  Swelling and ankles have improved with use of elevation and ACE.  Physical therapy in room and has suggested the use of TED hose along with ACE and ensuring the TED hose and ACE are to knee length.  No redness on dorsal aspect of right foot. Swelling bilateral medial and lateral malleolus mostly.  ROS: Patient denies SOB, abdominal pain, chest pain, and/V/C/D, and vision changes patient does still have rash underneath breasts which she has been seen by [facility] nurse about which is improving.   Objective:   No results found. No results for input(s): "WBC", "HGB", "HCT", "PLT" in the last 72 hours.  Recent Labs    02/13/22 0554  NA 137  K 4.2  CL 103  CO2 27  GLUCOSE 104*  BUN <5*  CREATININE 0.75  CALCIUM 8.8*    Intake/Output Summary (Last 24 hours) at 02/15/2022 1035 Last data filed at 02/14/2022 1308 Gross per 24 hour  Intake 236 ml  Output --  Net 236 ml     Pressure Injury 02/11/22 Buttocks Right Stage 2 -  Partial thickness loss of dermis presenting as a shallow open injury with a red, pink wound bed without slough. stage 2 right side along bony prominence - verified by Eyvonne Mechanic, RN (Active)  02/11/22 1830  Location: Buttocks  Location Orientation: Right  Staging: Stage 2 -  Partial thickness loss of dermis presenting as a shallow open injury with a red, pink wound bed without slough.  Wound Description (Comments): stage 2 right side along bony prominence - verified by Eyvonne Mechanic, RN  Present on Admission: Yes    Physical Exam: Vital Signs Blood pressure 134/60, pulse 97, temperature 98.6 F (37 C), temperature source Oral, resp. rate 18, height '5\' 4"'$  (1.626 m), weight 87.2 kg, SpO2 91 %.   General: Alert and oriented x 3, No apparent distress HEENT: Head is normocephalic, atraumatic, PERRLA, EOMI, sclera anicteric, oral mucosa pink  and moist, dentition intact, ext ear canals clear,  Neck: Supple without JVD or lymphadenopathy Note: patient has cervical collar in place.  No issues mouth was examined absent for thrush.  Patient is using Biotene for dry mouth. Heart: Reg rate and rhythm. No murmurs rubs or gallops Chest: CTA bilaterally without wheezes, rales, or rhonchi; no distress Abdomen: Soft, non-tender, non-distended, bowel sounds positive. Extremities: No clubbing, cyanosis, or edema. Pulses are 2+ Psych: Pt's affect is appropriate. Pt is cooperative Skin: Incision site is clean dry and intact does have areas of psoriasis. Note: 1) Patient does have red moist macerated area under her bilateral breasts, which is improving. She was seen by [facility] nurse with recommendations for silver impregnated fabric to be used for 5 days at a time.   2) patient also has 1-+2 edema nonpitting bilateral ankles, medial malleolus and lateral maleollus, 1-2+ lower leg edema from ankle to ~4 cm above ankle bilateral.  Likely dependent edema.  Has TED hose and ACE wrap in place, during day. Neuro: Patient is alert.  Oriented x3 and follows commands well Musculoskeletal: She has hard cervical collar in place, arthritis of small hand joints.  RUE  4/5, LUE 3/5, 4/5 in lower extremities bilaterally.  Assessment/Plan: 1. Functional deficits which require 3+ hours per day of interdisciplinary therapy in a comprehensive inpatient rehab setting. Physiatrist is providing close team supervision and 24 hour management of active medical problems listed below. Physiatrist and rehab team continue to assess barriers to discharge/monitor patient progress toward functional and medical goals  Care Tool:  Bathing    Body parts bathed by patient: Right arm, Left arm, Chest, Abdomen, Front perineal area, Right upper leg, Left upper leg, Face   Body parts bathed by helper: Left lower leg, Right lower leg, Buttocks     Bathing assist Assist Level: Maximal  Assistance - Patient 24 - 49%     Upper Body Dressing/Undressing Upper body dressing   What is the patient wearing?: Bra, Pull over shirt, Orthosis Orthosis activity level: Performed by helper  Upper body assist Assist Level: Maximal Assistance - Patient 25 - 49%    Lower Body Dressing/Undressing Lower body dressing      What is the patient wearing?: Underwear/pull up, Pants     Lower body assist Assist for lower body dressing: Maximal Assistance - Patient 25 - 49%     Toileting Toileting    Toileting assist Assist for toileting: Maximal Assistance - Patient 25 - 49%     Transfers Chair/bed transfer  Transfers assist     Chair/bed transfer assist level: Minimal Assistance - Patient > 75%     Locomotion Ambulation   Ambulation assist      Assist level: Contact Guard/Touching assist Assistive device: Walker-rolling Max distance: 200 ft   Walk 10 feet activity   Assist     Assist level: Contact Guard/Touching assist Assistive device: Walker-rolling   Walk 50 feet activity   Assist Walk 50 feet with 2 turns activity did not occur: Safety/medical concerns  Assist level: Contact Guard/Touching assist Assistive device: Walker-rolling    Walk 150 feet activity   Assist Walk 150 feet activity did not occur: Safety/medical concerns  Assist level: Contact Guard/Touching assist Assistive device: Walker-rolling    Walk 10 feet on uneven surface  activity   Assist Walk 10 feet on uneven surfaces activity did not occur: Safety/medical concerns         Wheelchair     Assist Is the patient using a wheelchair?: No Type of Wheelchair: Manual    Wheelchair assist level: Dependent - Patient 0%      Wheelchair 50 feet with 2 turns activity    Assist        Assist Level: Dependent - Patient 0%   Wheelchair 150 feet activity     Assist      Assist Level: Dependent - Patient 0%   Blood pressure 134/60, pulse 97, temperature  98.6 F (37 C), temperature source Oral, resp. rate 18, height '5\' 4"'$  (1.626 m), weight 87.2 kg, SpO2 91 %.    Medical Problem List and Plan: 1. 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  Cervical collar as directed             -patient may shower             -ELOS/Goals: 5-7 days S             -Con't CIR- PT and OT- educated pt RUE weakness is due to SCI- and I don't think needs additional  work up right now 2.  Antithrombotics: -DVT/anticoagulation:  Pharmaceutical: Lovenox check vascular study             -antiplatelet therapy: N/A 3. Postoperative pain: continue Tramadol 50 mg every 6 hours, Neurontin 100 mg twice daily, Flexeril 10 mg 3 times daily as needed, oxycodone as needed             6/8- pain controlled with scheduled tramadol 6/10-6/11 pain is well controlled with current as needed's of tramadol. 4. Mood: Provide emotional support             -antipsychotic agents: N/A 5. Neuropsych: This patient is capable of making decisions on her own behalf. 6. Skin/Wound Care: Routine skin checks 7. Fluids/Electrolytes/Nutrition: Routine in and outs with follow-up chemistries 8.  Neurogenic bowel and bladder.  Check PVR.  Patient had initially refused intermittent catheterization.  Establish bowel program             6/8- will order bladder scans to see if retaining still- not clear if needs bowel program quite yet.              6/9- is voiding- will con't to monitor to see if needs caths 6/11 Patient is voiding okay without needing caths 9.  Dysphagia.  Improved.  Diet advanced to regular 10.  Hypertension.  Patient on lisinopril 40 mg daily prior to admission.  Resume as needed             6/8- BP well controlled off Lisinopril 6/11 SBP ranging from 120's-140's off lisinopril, may need a few more  readings to trend and decide on resuming home lisinopril.    02/15/2022    5:18 AM 02/14/2022    8:50 PM 02/14/2022    1:36 PM  Vitals with BMI  Systolic 295 621 308  Diastolic 60 57 53  Pulse 97 98 100    11.  Hyperlipidemia.  Restart Lovaza 2 g twice daily 12.  Psoriasis.  Restart Oteleza 30 mg twice daily             6/8- will send Otezla and cream to pharmacy so can use.  6/11 patient to continue Oteleza 30 mg BID 13. 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 Hypokalemia             6/8- K+ 2.9- will give KCL 40 mEq x3 and recheck in AM- was last 3.7             6/9- K+ 4.2 this AM- will recheck Monday            6/11 pending Monday labs 15. MASD under breasts             6/9- will consult WOC for recommendations- is severe. With open skin 16. Constipation             6/9- Will order sorbitol after therapy.  6/10 Patient's last BM was today sorbitol worked well. 6/11 BM x2 yesterday. 17. Moderate malnutrition             6/9- will add Ensure BID between meals 18. Dry mouth             6/9- doesn't have thrush on exam- will order Biotene mouthwash   6/10 continue use of Biotene 19.  Bilateral lower ankle edema 6/10 patient says that she has had edema that started on Thursday.  She says that it is mostly in the ankles wonders if it could be due to  her psoriatic arthritis.  Some of the swelling on the anterior right ankle has subsided overall there is greater edema in the left ankle versus right there is no edema of the legs and +1 nonpitting edema the dorsal surface of both feet.  Edema is +2 nonpitting for the ankles.  Encourage use of elevation for suspected dependent edema if this does not work may also add on use of Ace wrap. 6/11 PT placed TED hose (ankle high) on pt then placed ACE wrap over. Swelling of lower legs and ankles less today than yesterday.  Continue elevation of legs with pillows.     LOS: 4 days A FACE TO FACE EVALUATION WAS PERFORMED  Luetta Nutting 02/15/2022, 10:35 AM

## 2022-02-15 NOTE — Plan of Care (Signed)
  Problem: Consults Goal: RH SPINAL CORD INJURY PATIENT EDUCATION Description:  See Patient Education module for education specifics.  Outcome: Progressing Goal: Skin Care Protocol Initiated - if Braden Score 18 or less Description: If consults are not indicated, leave blank or document N/A Outcome: Progressing   Problem: SCI BOWEL ELIMINATION Goal: RH STG MANAGE BOWEL WITH ASSISTANCE Description: STG Manage Bowel with Supervision Assistance. Outcome: Progressing   Problem: SCI BOWEL ELIMINATION Goal: RH STG MANAGE BOWEL WITH ASSISTANCE Description: STG Manage Bowel with Supervision Assistance. Outcome: Progressing Goal: RH STG SCI MANAGE BOWEL WITH MEDICATION WITH ASSISTANCE Description: STG SCI Manage bowel with medication with supervision assistance. Outcome: Progressing   Problem: SCI BLADDER ELIMINATION Goal: RH STG MANAGE BLADDER WITH ASSISTANCE Description: STG Manage Bladder With Supervision Assistance Outcome: Progressing Goal: RH STG MANAGE BLADDER WITH MEDICATION WITH ASSISTANCE Description: STG Manage Bladder With Medication With Supervision Assistance. Outcome: Progressing   Problem: RH SKIN INTEGRITY Goal: RH STG MAINTAIN SKIN INTEGRITY WITH ASSISTANCE Description: STG Maintain Skin Integrity With Supervision Assistance. Outcome: Progressing Goal: RH STG ABLE TO PERFORM INCISION/WOUND CARE W/ASSISTANCE Description: STG Able To Perform Incision/Wound Care With Supervision Assistance. Outcome: Progressing   Problem: RH SAFETY Goal: RH STG ADHERE TO SAFETY PRECAUTIONS W/ASSISTANCE/DEVICE Description: STG Adhere to Safety Precautions With Cues and Reminders. Outcome: Progressing Goal: RH STG DECREASED RISK OF FALL WITH ASSISTANCE Description: STG Decreased Risk of Fall With Supervision Assistance. Outcome: Progressing   Problem: RH PAIN MANAGEMENT Goal: RH STG PAIN MANAGED AT OR BELOW PT'S PAIN GOAL Description: < 3 on a 0-10 pain scale. Outcome:  Progressing   Problem: RH KNOWLEDGE DEFICIT SCI Goal: RH STG INCREASE KNOWLEDGE OF SELF CARE AFTER SCI Description: Patient will demonstrate knowledge of self-care, medication/pain management, bowel/bladder management, skin/wound care with educational materials and handouts provided by staff independently at discharge. Outcome: Progressing

## 2022-02-15 NOTE — Progress Notes (Signed)
Spoke with therapy this morning regarding patient swelling in B/L LE. Suggested that patient may benefit from different leg rest to elevate legs while in wheelchair. Therapy has implemented new elevating foot rest to help decrease swelling. Patient is to continue with TED hose and ace wraps during the day. Daughter and patient informed. Sanda Linger, LPN

## 2022-02-16 ENCOUNTER — Inpatient Hospital Stay (HOSPITAL_COMMUNITY): Payer: Medicare Other

## 2022-02-16 DIAGNOSIS — M7989 Other specified soft tissue disorders: Secondary | ICD-10-CM

## 2022-02-16 LAB — CBC WITH DIFFERENTIAL/PLATELET
Abs Immature Granulocytes: 0.16 10*3/uL — ABNORMAL HIGH (ref 0.00–0.07)
Basophils Absolute: 0.1 10*3/uL (ref 0.0–0.1)
Basophils Relative: 1 %
Eosinophils Absolute: 0.3 10*3/uL (ref 0.0–0.5)
Eosinophils Relative: 4 %
HCT: 28.8 % — ABNORMAL LOW (ref 36.0–46.0)
Hemoglobin: 9 g/dL — ABNORMAL LOW (ref 12.0–15.0)
Immature Granulocytes: 2 %
Lymphocytes Relative: 20 %
Lymphs Abs: 1.5 10*3/uL (ref 0.7–4.0)
MCH: 27.4 pg (ref 26.0–34.0)
MCHC: 31.3 g/dL (ref 30.0–36.0)
MCV: 87.8 fL (ref 80.0–100.0)
Monocytes Absolute: 0.7 10*3/uL (ref 0.1–1.0)
Monocytes Relative: 10 %
Neutro Abs: 5 10*3/uL (ref 1.7–7.7)
Neutrophils Relative %: 63 %
Platelets: 403 10*3/uL — ABNORMAL HIGH (ref 150–400)
RBC: 3.28 MIL/uL — ABNORMAL LOW (ref 3.87–5.11)
RDW: 13.7 % (ref 11.5–15.5)
WBC: 7.7 10*3/uL (ref 4.0–10.5)
nRBC: 0 % (ref 0.0–0.2)

## 2022-02-16 LAB — COMPREHENSIVE METABOLIC PANEL
ALT: 11 U/L (ref 0–44)
AST: 16 U/L (ref 15–41)
Albumin: 2.2 g/dL — ABNORMAL LOW (ref 3.5–5.0)
Alkaline Phosphatase: 45 U/L (ref 38–126)
Anion gap: 10 (ref 5–15)
BUN: <5 mg/dL — ABNORMAL LOW (ref 8–23)
CO2: 30 mmol/L (ref 22–32)
Calcium: 8.8 mg/dL — ABNORMAL LOW (ref 8.9–10.3)
Chloride: 98 mmol/L (ref 98–111)
Creatinine, Ser: 0.73 mg/dL (ref 0.44–1.00)
GFR, Estimated: >60 mL/min (ref >60)
Glucose, Bld: 111 mg/dL — ABNORMAL HIGH (ref 70–99)
Potassium: 3.5 mmol/L (ref 3.5–5.1)
Sodium: 138 mmol/L (ref 135–145)
Total Bilirubin: 0.7 mg/dL (ref 0.3–1.2)
Total Protein: 5.7 g/dL — ABNORMAL LOW (ref 6.5–8.1)

## 2022-02-16 MED ORDER — POTASSIUM CHLORIDE CRYS ER 10 MEQ PO TBCR
10.0000 meq | EXTENDED_RELEASE_TABLET | Freq: Every day | ORAL | Status: DC
  Administered 2022-02-16 – 2022-02-25 (×10): 10 meq via ORAL
  Filled 2022-02-16 (×10): qty 1

## 2022-02-16 NOTE — Progress Notes (Signed)
Physical Therapy Session Note  Patient Details  Name: Shelby Fleming MRN: [id number] Date of Birth: Feb 03, 1946  Today's Date: 02/16/2022 PT Individual Time: 0100-7121 PT Individual Time Calculation (min): 68 min   Short Term Goals: Week 1:  PT Short Term Goal 1 (Week 1): Pt will transfer sup to sit w/ log roll w/ min A. PT Short Term Goal 2 (Week 1): Pt will transfer sit to stand w/ CGA. PT Short Term Goal 3 (Week 1): Pt will amb 100' w/ RW and CGA. PT Short Term Goal 4 (Week 1): Address stairs  Skilled Therapeutic Interventions/Progress Updates:    Pt received seated in w/c in room, agreeable to PT session. No complaints of pain. Pt wearing THT and using ELR for BLE edema management. Sit to stand with CGA to RW during session. Ambulation 2 x 200 ft with RW and CGA for balance, focus on upright posture. Ascend/descend 8 x 3" stairs with 2 handrails and min A for balance, step-to gait pattern. Standing alt L/R 3" step-ups with BUE support and CGA for balance, cues for sequencing. Sidesteps L/R 3 x 10 ft with handrail in hallway and CGA for balance, cues for correct body positioning. Toilet transfer with RW and CGA, some assist needed for clothing management for thoroughness of pericare. Pt returns to bed at end of session for BLE elevation. Sit to supine Supervision. Pt left seated in bed with needs in reach, bed alarm in place.  Therapy Documentation Precautions:  Precautions Precautions: Fall, Cervical Required Braces or Orthoses: Cervical Brace Cervical Brace: Hard collar, At all times Restrictions Weight Bearing Restrictions: No      Therapy/Group: Individual Therapy   Excell Seltzer, PT, DPT, CSRS 02/16/2022, 3:45 PM

## 2022-02-16 NOTE — Plan of Care (Signed)
  Problem: Consults Goal: RH SPINAL CORD INJURY PATIENT EDUCATION Description:  See Patient Education module for education specifics.  Outcome: Progressing Goal: Skin Care Protocol Initiated - if Braden Score 18 or less Description: If consults are not indicated, leave blank or document N/A Outcome: Progressing   Problem: SCI BOWEL ELIMINATION Goal: RH STG MANAGE BOWEL WITH ASSISTANCE Description: STG Manage Bowel with Supervision Assistance. Outcome: Progressing Goal: RH STG SCI MANAGE BOWEL WITH MEDICATION WITH ASSISTANCE Description: STG SCI Manage bowel with medication with supervision assistance. Outcome: Progressing   Problem: SCI BLADDER ELIMINATION Goal: RH STG MANAGE BLADDER WITH ASSISTANCE Description: STG Manage Bladder With Supervision Assistance Outcome: Progressing Goal: RH STG MANAGE BLADDER WITH MEDICATION WITH ASSISTANCE Description: STG Manage Bladder With Medication With Supervision Assistance. Outcome: Progressing   Problem: RH SKIN INTEGRITY Goal: RH STG MAINTAIN SKIN INTEGRITY WITH ASSISTANCE Description: STG Maintain Skin Integrity With Supervision Assistance. Outcome: Progressing Goal: RH STG ABLE TO PERFORM INCISION/WOUND CARE W/ASSISTANCE Description: STG Able To Perform Incision/Wound Care With Supervision Assistance. Outcome: Progressing   Problem: RH SAFETY Goal: RH STG ADHERE TO SAFETY PRECAUTIONS W/ASSISTANCE/DEVICE Description: STG Adhere to Safety Precautions With Cues and Reminders. Outcome: Progressing Goal: RH STG DECREASED RISK OF FALL WITH ASSISTANCE Description: STG Decreased Risk of Fall With Supervision Assistance. Outcome: Progressing   Problem: RH PAIN MANAGEMENT Goal: RH STG PAIN MANAGED AT OR BELOW PT'S PAIN GOAL Description: < 3 on a 0-10 pain scale. Outcome: Progressing   Problem: RH KNOWLEDGE DEFICIT SCI Goal: RH STG INCREASE KNOWLEDGE OF SELF CARE AFTER SCI Description: Patient will demonstrate knowledge of self-care,  medication/pain management, bowel/bladder management, skin/wound care with educational materials and handouts provided by staff independently at discharge. Outcome: Progressing

## 2022-02-16 NOTE — Progress Notes (Signed)
Right lower extremity venous duplex study completed. Please see CV Proc for preliminary results.  Kerrington Greenhalgh BS, RVT 02/16/2022 3:45 PM

## 2022-02-16 NOTE — Progress Notes (Signed)
Occupational Therapy Session Note  Patient Details  Name: Shelby Fleming MRN: [id number] Date of Birth: 1945-12-31  Today's Date: 02/16/2022 OT Individual Time: 5374-8270 OT Individual Time Calculation (min): 75 min    Short Term Goals: Week 1:  OT Short Term Goal 1 (Week 1): Patient will complete toilet transfer using RW with Min A OT Short Term Goal 2 (Week 1): Patient will increase BUE A/ROM to [facility] in order to increase her ability to complete upper body bathing and dressing with less difficulty. OT Short Term Goal 3 (Week 1): Patient will increase her ability to comolete upper and lower body bathing with Mod assist with use of AE as needed. OT Short Term Goal 4 (Week 1): Patient will demonstrate improved activity tolerance while completing morning self care routine for 60 minutes requiring only 2-3 rest breaks.  Skilled Therapeutic Interventions/Progress Updates:    Pt resting in w/c upon arrival. OT intervention with focus on grooming at sink, furniture transfers, simulated walk in shower tranfsers, bed transfers, discharge planning, and activity tolerance to increase independence with BADLs. Pt completed grooming tasks with supervision. W/c<>sofa transfers X 3 with CGA. W/c<>EOB tranfsers X 2 with CGA. Walk in shower tranfsers X 2 with CGA. Pt returned to room and remained in w/c with belt alarm activated. Pts son-in-law present.   Therapy Documentation Precautions:  Precautions Precautions: Fall, Cervical Required Braces or Orthoses: Cervical Brace Cervical Brace: Hard collar, At all times Restrictions Weight Bearing Restrictions: No  Pain: Pt reports tenderness RUE (upper arm medial); MD aware, emotional support   Therapy/Group: Individual Therapy  Leroy Libman 02/16/2022, 10:15 AM

## 2022-02-16 NOTE — Progress Notes (Signed)
PROGRESS NOTE   Subjective/Complaints:   Pt reports busy weekend- Has some swelling in her legs- but thinks it's better.  Most trouble with pain is from R elbow to R shoulder- which is pretty stable.  LBM yesterday.  Underneath breasts rawness feeling somewhat better   ROS:  Pt denies SOB, abd pain, CP, N/V/C/D, and vision changes  Objective:   No results found. Recent Labs    02/16/22 0517  WBC 7.7  HGB 9.0*  HCT 28.8*  PLT 403*    Recent Labs    02/16/22 0517  NA 138  K 3.5  CL 98  CO2 30  GLUCOSE 111*  BUN <5*  CREATININE 0.73  CALCIUM 8.8*    Intake/Output Summary (Last 24 hours) at 02/16/2022 0953 Last data filed at 02/16/2022 0100 Gross per 24 hour  Intake 537 ml  Output 23 ml  Net 514 ml     Pressure Injury 02/11/22 Buttocks Right Stage 2 -  Partial thickness loss of dermis presenting as a shallow open injury with a red, pink wound bed without slough. stage 2 right side along bony prominence - verified by Eyvonne Mechanic, RN (Active)  02/11/22 1830  Location: Buttocks  Location Orientation: Right  Staging: Stage 2 -  Partial thickness loss of dermis presenting as a shallow open injury with a red, pink wound bed without slough.  Wound Description (Comments): stage 2 right side along bony prominence - verified by Eyvonne Mechanic, RN  Present on Admission: Yes    Physical Exam: Vital Signs Blood pressure (!) 123/53, pulse 95, temperature 98.9 F (37.2 C), temperature source Oral, resp. rate 18, height '5\' 4"'$  (1.626 m), weight 87.2 kg, SpO2 90 %.     General: awake, alert, appropriate, eating breakfast; NAD HENT: conjugate gaze; oropharynx moist- wearing cervical collar-  CV: regular rate; no JVD Pulmonary: CTA B/L; no W/R/R- good air movement GI: soft, NT, ND, (+)BS Psychiatric: appropriate- calm; sitting up slightly in bed Neurological: Ox3  Extremities: No clubbing, cyanosis, or edema. Pulses are  2+ Psych: Pt's affect is appropriate. Pt is cooperative Skin: Incision site is clean dry and intact does have areas of psoriasis. Honeycomb on cervical dressing Note: 1) Patient does have red moist macerated area under her bilateral breasts, which is improving. She was seen by [facility] nurse with recommendations for silver impregnated fabric to be used for 5 days at a time.   2) patient also has 1-+2 edema nonpitting bilateral ankles, medial malleolus and lateral maleollus,  1+ LE edema- looks better  Has TED hose and ACE wrap in place, during day. Neuro: Patient is alert.  Oriented x3 and follows commands well Musculoskeletal: She has hard cervical collar in place, arthritis of small hand joints.  RUE 4/5, LUE 3/5, 4/5 in lower extremities bilaterally.  Assessment/Plan: 1. Functional deficits which require 3+ hours per day of interdisciplinary therapy in a comprehensive inpatient rehab setting. Physiatrist is providing close team supervision and 24 hour management of active medical problems listed below. Physiatrist and rehab team continue to assess barriers to discharge/monitor patient progress toward functional and medical goals  Care Tool:  Bathing    Body parts bathed by patient: Right  arm, Left arm, Chest, Abdomen, Front perineal area, Right upper leg, Left upper leg, Face   Body parts bathed by helper: Left lower leg, Right lower leg, Buttocks     Bathing assist Assist Level: Maximal Assistance - Patient 24 - 49%     Upper Body Dressing/Undressing Upper body dressing   What is the patient wearing?: Bra, Pull over shirt, Orthosis Orthosis activity level: Performed by helper  Upper body assist Assist Level: Maximal Assistance - Patient 25 - 49%    Lower Body Dressing/Undressing Lower body dressing      What is the patient wearing?: Underwear/pull up, Pants     Lower body assist Assist for lower body dressing: Maximal Assistance - Patient 25 - 49%     Toileting Toileting     Toileting assist Assist for toileting: Maximal Assistance - Patient 25 - 49%     Transfers Chair/bed transfer  Transfers assist     Chair/bed transfer assist level: Contact Guard/Touching assist     Locomotion Ambulation   Ambulation assist      Assist level: Contact Guard/Touching assist Assistive device: Walker-rolling Max distance: 210   Walk 10 feet activity   Assist     Assist level: Contact Guard/Touching assist Assistive device: Walker-rolling   Walk 50 feet activity   Assist Walk 50 feet with 2 turns activity did not occur: Safety/medical concerns  Assist level: Contact Guard/Touching assist Assistive device: Walker-rolling    Walk 150 feet activity   Assist Walk 150 feet activity did not occur: Safety/medical concerns  Assist level: Contact Guard/Touching assist Assistive device: Walker-rolling    Walk 10 feet on uneven surface  activity   Assist Walk 10 feet on uneven surfaces activity did not occur: Safety/medical concerns         Wheelchair     Assist Is the patient using a wheelchair?: No Type of Wheelchair: Manual    Wheelchair assist level: Dependent - Patient 0%      Wheelchair 50 feet with 2 turns activity    Assist        Assist Level: Dependent - Patient 0%   Wheelchair 150 feet activity     Assist      Assist Level: Dependent - Patient 0%   Blood pressure (!) 123/53, pulse 95, temperature 98.9 F (37.2 C), temperature source Oral, resp. rate 18, height '5\' 4"'$  (1.626 m), weight 87.2 kg, SpO2 90 %.    Medical Problem List and Plan: 1. 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  Cervical collar as directed             -patient may shower             -ELOS/Goals: 5-7 days S             -Cn't CIR_ PT and OT-  team conference tomorrow ot determine length of stay 2.  Antithrombotics: -DVT/anticoagulation:  Pharmaceutical: Lovenox check vascular study             -antiplatelet therapy: N/A 3. Postoperative pain: continue Tramadol 50 mg every 6 hours, Neurontin 100 mg twice daily, Flexeril 10 mg 3 times daily as needed, oxycodone as needed             6/8- pain controlled with scheduled tramadol  6/12- pain controlled, but still bothersome in R upper arm still 4. Mood: Provide  emotional support             -antipsychotic agents: N/A 5. Neuropsych: This patient is capable of making decisions on her own behalf. 6. Skin/Wound Care: Routine skin checks 7. Fluids/Electrolytes/Nutrition: Routine in and outs with follow-up chemistries 8.  Neurogenic bowel and bladder.  Check PVR.  Patient had initially refused intermittent catheterization.  Establish bowel program             6/8- will order bladder scans to see if retaining still- not clear if needs bowel program quite yet.              6/9- is voiding- will con't to monitor to see if needs caths 6/11 Patient is voiding okay without needing caths  6/12- LBM yesterday small- bladder scans <100cc- con'to monitor 9.  Dysphagia.  Improved.  Diet advanced to regular 10.  Hypertension.  Patient on lisinopril 40 mg daily prior to admission.  Resume as needed             6/8- BP well controlled off Lisinopril  6/12- BP well controlled- not on Lisinopril    02/16/2022    3:53 AM 02/15/2022    6:30 PM 02/15/2022    1:47 PM  Vitals with BMI  Systolic [phone]  Diastolic 53 55 54  Pulse 95 98 98    11.  Hyperlipidemia.  Restart Lovaza 2 g twice daily 12.  Psoriasis.  Restart Oteleza 30 mg twice daily             6/8- will send Otezla and cream to pharmacy so can use.  6/11 patient to continue Oteleza 30 mg BID 13. 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 Hypokalemia             6/8- K+ 2.9- will give KCL 40 mEq x3 and recheck in AM- was last 3.7              6/9- K+ 4.2 this AM- will recheck Monday            6/12- K+ 3.5- will put on low daily dose of KCL 10 mEq daily and recheck Thursday 15. MASD under breasts             6/9- will consult WOC for recommendations- is severe. With open skin 16. Constipation             6/9- Will order sorbitol after therapy.  6/10 Patient's last BM was today sorbitol worked well.  6/12- LBM yesterday 17. Moderate malnutrition             6/9- will add Ensure BID between meals 18. Dry mouth             6/9- doesn't have thrush on exam- will order Biotene mouthwash   6/10 continue use of Biotene 19.  Bilateral lower ankle edema 6/10 patient says that she has had edema that started on Thursday.  She says that it is mostly in the ankles wonders if it could be due to her psoriatic arthritis.  Some of the swelling on the anterior right ankle has subsided overall there is greater edema in the left ankle versus right there is no edema of the legs and +1 nonpitting edema the dorsal surface of both feet.  Edema is +2 nonpitting for the ankles.  Encourage use of elevation for suspected dependent edema if this does not work may also add on use of Ace wrap. 6/11 PT placed TED  hose (ankle high) on pt then placed ACE wrap over. Swelling of lower legs and ankles less today than yesterday.  Continue elevation of legs with pillows.   6/12- LE edema looking better- con't regimen     LOS: 5 days A FACE TO FACE EVALUATION WAS PERFORMED  Ulah Olmo 02/16/2022, 9:53 AM

## 2022-02-16 NOTE — Progress Notes (Signed)
Occupational Therapy Session Note  Patient Details  Name: Shelby Fleming MRN: [id number] Date of Birth: 1946/02/04  Today's Date: 02/16/2022 OT Individual Time: 1030-1130 OT Individual Time Calculation (min): 60 min    Short Term Goals: Week 1:  OT Short Term Goal 1 (Week 1): Patient will complete toilet transfer using RW with Min A OT Short Term Goal 2 (Week 1): Patient will increase BUE A/ROM to [facility] in order to increase her ability to complete upper body bathing and dressing with less difficulty. OT Short Term Goal 3 (Week 1): Patient will increase her ability to comolete upper and lower body bathing with Mod assist with use of AE as needed. OT Short Term Goal 4 (Week 1): Patient will demonstrate improved activity tolerance while completing morning self care routine for 60 minutes requiring only 2-3 rest breaks.  Skilled Therapeutic Interventions/Progress Updates:    S: Patient visiting with Son -in-law upon therapy arrival and looking forward to therapy session.   O: - Pt ambulated with RW at close supervision from w/c into bathroom to transfer onto toilet. VC for technique provided as needed.  - Pt completed toileting with supervision while standing prior to ambulating back to her w/c using RW at supervision. -While seated at sink in w/c, patient washed her face with set-up of supplies provided. Patient required Min assist and VC for form and technique to completed UB dressing. Able to complete UB bathing with supervision. Assist was provided to apply anti-fungi powder. LB bathing and dressing was completed with min assist. Patient able to cross each ankle over her opposite knee although due to tightness during er of the hip, she experienced limited ROM/joint mobility and required assist to place feet into pant leg or through brief. Therapist provided total assist for donning TEDs and shoes including tying shoelaces.    A: Patient demonstrating improved functional performance during skilled  OT session focusing on ADL re-training and activity endurance. Patient required min physical assist overall when managing clothing for the lower body due to limited joint mobility/flexibility and when maneuvering her shirt around her cervical collar. VC were provided during session for technique, activity modification, and any safety recommendations. Rest breaks were taken as needed for energy conservation.     P:  Provide elastic shoelaces, long handled sponge, and trial use of reacher for dressing to increase functional performance during morning ADL.   Therapy Documentation Precautions:  Precautions Precautions: Fall, Cervical Required Braces or Orthoses: Cervical Brace Cervical Brace: Hard collar, At all times Restrictions Weight Bearing Restrictions: No  Pain:  No pain reported.   Therapy/Group: Individual Therapy  Ailene Ravel, OTR/L,CBIS  Supplemental OT - [facility]  02/16/2022, 8:01 AM

## 2022-02-17 NOTE — Progress Notes (Signed)
PROGRESS NOTE   Subjective/Complaints:   Pt reports U/S on legs yesterday. Has superficial clot per pt.  Breasts- underneath doing much better.   ROS:    Pt denies SOB, abd pain, CP, N/V/C/D, and vision changes  Objective:   VAS [place] LOWER EXTREMITY VENOUS (DVT)  Result Date: 02/16/2022  Lower Venous DVT Study Patient Name:  Shelby Fleming  Date of Exam:   02/16/2022 Medical Rec #: [id number]    Accession #:    [id number] Date of Birth: 1945/11/15     Patient Gender: F Patient Age:   76 years Exam Location:  [facility] Procedure:      VAS [place] LOWER EXTREMITY VENOUS (DVT) Referring Phys: Lauraine Rinne --------------------------------------------------------------------------------  Indications: Swelling.  Comparison Study: 02/12/22, positive acute right superficial thrombophlebitis of                   the small saphenous vein. Performing Technologist: Bobetta Lime  Examination Guidelines: A complete evaluation includes B-mode imaging, spectral Doppler, color Doppler, and power Doppler as needed of all accessible portions of each vessel. Bilateral testing is considered an integral part of a complete examination. Limited examinations for reoccurring indications may be performed as noted. The reflux portion of the exam is performed with the patient in reverse Trendelenburg.  +---------+---------------+---------+-----------+------------+----------------+ RIGHT    CompressibilityPhasicitySpontaneityProperties  Thrombus Aging   +---------+---------------+---------+-----------+------------+----------------+ CFV      Full           Yes      Yes                                     +---------+---------------+---------+-----------+------------+----------------+ SFJ      Full                                                            +---------+---------------+---------+-----------+------------+----------------+ FV Prox  Full                                                             +---------+---------------+---------+-----------+------------+----------------+ FV Mid   Full                                                            +---------+---------------+---------+-----------+------------+----------------+ FV DistalFull                                                            +---------+---------------+---------+-----------+------------+----------------+  PFV      Full                                                            +---------+---------------+---------+-----------+------------+----------------+ POP      Full           Yes      Yes                                     +---------+---------------+---------+-----------+------------+----------------+ PTV      Full                                                            +---------+---------------+---------+-----------+------------+----------------+ PERO     Full                                                            +---------+---------------+---------+-----------+------------+----------------+ SSV      None                               with        Age                                                          striations  Indeterminate    +---------+---------------+---------+-----------+------------+----------------+ No progression noted of the thrombus in the small saphenous vein.  +----+---------------+---------+-----------+----------+--------------+ LEFTCompressibilityPhasicitySpontaneityPropertiesThrombus Aging +----+---------------+---------+-----------+----------+--------------+ CFV Full           Yes      Yes                                 +----+---------------+---------+-----------+----------+--------------+    Summary: RIGHT: - Findings consistent with age indeterminate superficial vein thrombosis involving the right small saphenous vein. - Findings appear essentially unchanged compared to  previous examination. - There is no evidence of deep vein thrombosis in the lower extremity.  - No cystic structure found in the popliteal fossa.  LEFT: - No evidence of common femoral vein obstruction. - No cystic structure found in the popliteal fossa.  *See table(s) above for measurements and observations. Electronically signed by Servando Snare MD on 02/16/2022 at 6:03:15 PM.    Final    Recent Labs    02/16/22 0517  WBC 7.7  HGB 9.0*  HCT 28.8*  PLT 403*    Recent Labs    02/16/22 0517  NA 138  K 3.5  CL 98  CO2 30  GLUCOSE 111*  BUN <5*  CREATININE 0.73  CALCIUM 8.8*    Intake/Output Summary (Last 24 hours) at 02/17/2022  0881 Last data filed at 02/16/2022 2200 Gross per 24 hour  Intake 832 ml  Output --  Net 832 ml     Pressure Injury 02/11/22 Buttocks Right Stage 2 -  Partial thickness loss of dermis presenting as a shallow open injury with a red, pink wound bed without slough. stage 2 right side along bony prominence - verified by Eyvonne Mechanic, RN (Active)  02/11/22 1830  Location: Buttocks  Location Orientation: Right  Staging: Stage 2 -  Partial thickness loss of dermis presenting as a shallow open injury with a red, pink wound bed without slough.  Wound Description (Comments): stage 2 right side along bony prominence - verified by Eyvonne Mechanic, RN  Present on Admission: Yes    Physical Exam: Vital Signs Blood pressure (!) 111/51, pulse 92, temperature 98.3 F (36.8 C), resp. rate 18, height '5\' 4"'$  (1.626 m), weight 87.2 kg, SpO2 (!) 89 %.      General: awake, alert, appropriate, supine in bed; NAD HENT: conjugate gaze; oropharynx moist- wearing cervical collar- honeycomb dressing in place CV: regular rate; no JVD Pulmonary: CTA B/L; no W/R/R- good air movement GI: soft, NT, ND, (+)BS Psychiatric: appropriate Neurological: Ox3 Extremities: No clubbing, cyanosis, or edema. Pulses are 2+ Psych: Pt's affect is appropriate. Pt is cooperative Skin: Incision site is clean  dry and intact does have areas of psoriasis. Honeycomb on cervical dressing Note: 1) Patient does have red moist macerated area under her bilateral breasts, which is improving. She was seen by [facility] nurse with recommendations for silver impregnated fabric to be used for 5 days at a time.   2) patient also has 1-+2 edema nonpitting bilateral ankles, medial malleolus and lateral maleollus,  1+ LE edema- looks better  Has TED hose and ACE wrap in place, during day. Neuro: Patient is alert.  Oriented x3 and follows commands well Musculoskeletal: She has hard cervical collar in place, arthritis of small hand joints.  RUE 4/5, LUE 3/5, 4/5 in lower extremities bilaterally.  Assessment/Plan: 1. Functional deficits which require 3+ hours per day of interdisciplinary therapy in a comprehensive inpatient rehab setting. Physiatrist is providing close team supervision and 24 hour management of active medical problems listed below. Physiatrist and rehab team continue to assess barriers to discharge/monitor patient progress toward functional and medical goals  Care Tool:  Bathing    Body parts bathed by patient: Right arm, Left arm, Chest, Abdomen, Front perineal area, Right upper leg, Left upper leg, Face   Body parts bathed by helper: Left lower leg, Right lower leg, Buttocks     Bathing assist Assist Level: Maximal Assistance - Patient 24 - 49%     Upper Body Dressing/Undressing Upper body dressing   What is the patient wearing?: Bra, Pull over shirt, Orthosis Orthosis activity level: Performed by helper  Upper body assist Assist Level: Maximal Assistance - Patient 25 - 49%    Lower Body Dressing/Undressing Lower body dressing      What is the patient wearing?: Underwear/pull up, Pants     Lower body assist Assist for lower body dressing: Maximal Assistance - Patient 25 - 49%     Toileting Toileting    Toileting assist Assist for toileting: Maximal Assistance - Patient 25 - 49%      Transfers Chair/bed transfer  Transfers assist     Chair/bed transfer assist level: Contact Guard/Touching assist     Locomotion Ambulation   Ambulation assist      Assist level: Contact Guard/Touching assist Assistive device:  Walker-rolling Max distance: 200'   Walk 10 feet activity   Assist     Assist level: Contact Guard/Touching assist Assistive device: Walker-rolling   Walk 50 feet activity   Assist Walk 50 feet with 2 turns activity did not occur: Safety/medical concerns  Assist level: Contact Guard/Touching assist Assistive device: Walker-rolling    Walk 150 feet activity   Assist Walk 150 feet activity did not occur: Safety/medical concerns  Assist level: Contact Guard/Touching assist Assistive device: Walker-rolling    Walk 10 feet on uneven surface  activity   Assist Walk 10 feet on uneven surfaces activity did not occur: Safety/medical concerns         Wheelchair     Assist Is the patient using a wheelchair?: Yes Type of Wheelchair: Manual    Wheelchair assist level: Dependent - Patient 0%      Wheelchair 50 feet with 2 turns activity    Assist        Assist Level: Dependent - Patient 0%   Wheelchair 150 feet activity     Assist      Assist Level: Dependent - Patient 0%   Blood pressure (!) 111/51, pulse 92, temperature 98.3 F (36.8 C), resp. rate 18, height '5\' 4"'$  (1.626 m), weight 87.2 kg, SpO2 (!) 89 %.    Medical Problem List and Plan: 1. 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  Cervical collar as directed             -patient may shower             -ELOS/Goals: 5-7 days S             Con't CIR- PT and OT- team conference today to determine length of stay 2.  Antithrombotics: -DVT/anticoagulation:   Pharmaceutical: Lovenox check vascular study  6/13- has superficial Venous thrombosis very low down- will not treat- per CHEST guidelines             -antiplatelet therapy: N/A 3. Postoperative pain: continue Tramadol 50 mg every 6 hours, Neurontin 100 mg twice daily, Flexeril 10 mg 3 times daily as needed, oxycodone as needed             6/8- pain controlled with scheduled tramadol  6/12- pain controlled, but still bothersome in R upper arm still 4. Mood: Provide emotional support             -antipsychotic agents: N/A 5. Neuropsych: This patient is capable of making decisions on her own behalf. 6. Skin/Wound Care: Routine skin checks 7. Fluids/Electrolytes/Nutrition: Routine in and outs with follow-up chemistries 8.  Neurogenic bowel and bladder.  Check PVR.  Patient had initially refused intermittent catheterization.  Establish bowel program             6/8- will order bladder scans to see if retaining still- not clear if needs bowel program quite yet.              6/9- is voiding- will con't to monitor to see if needs caths 6/11 Patient is voiding okay without needing caths  6/12- LBM yesterday small- bladder scans <100cc- con'to monitor 9.  Dysphagia.  Improved.  Diet advanced to regular 10.  Hypertension.  Patient on lisinopril 40 mg daily prior to admission.  Resume as needed             6/8-  BP well controlled off Lisinopril  6/12- BP well controlled- not on Lisinopril    02/17/2022    3:12 AM 02/16/2022    7:08 PM 02/16/2022    1:13 PM  Vitals with BMI  Systolic 998 338 250  Diastolic 51 57 58  Pulse 92 107 97    11.  Hyperlipidemia.  Restart Lovaza 2 g twice daily 12.  Psoriasis.  Restart Oteleza 30 mg twice daily             6/8- will send Otezla and cream to pharmacy so can use.  6/11 patient to continue Oteleza 30 mg BID 13. 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 Hypokalemia             6/8- K+ 2.9- will give KCL 40 mEq x3 and recheck in AM- was last 3.7              6/9- K+ 4.2 this AM- will recheck Monday            6/12- K+ 3.5- will put on low daily dose of KCL 10 mEq daily and recheck Thursday  6/13- will recheck Thursday  15. MASD under breasts             6/9- will consult WOC for recommendations- is severe. With open skin 16. Constipation             6/9- Will order sorbitol after therapy.  6/10 Patient's last BM was today sorbitol worked well.  6/12- LBM yesterday 17. Moderate malnutrition             6/9- will add Ensure BID between meals 18. Dry mouth             6/9- doesn't have thrush on exam- will order Biotene mouthwash   6/13- Mouth less dry with Biotene 19.  Bilateral lower ankle edema 6/10 patient says that she has had edema that started on Thursday.  She says that it is mostly in the ankles wonders if it could be due to her psoriatic arthritis.  Some of the swelling on the anterior right ankle has subsided overall there is greater edema in the left ankle versus right there is no edema of the legs and +1 nonpitting edema the dorsal surface of both feet.  Edema is +2 nonpitting for the ankles.  Encourage use of elevation for suspected dependent edema if this does not work may also add on use of Ace wrap. 6/11 PT placed TED hose (ankle high) on pt then placed ACE wrap over. Swelling of lower legs and ankles less today than yesterday.  Continue elevation of legs with pillows.   6/12- LE edema looking better- con't regimen   I spent a total of  36  minutes on total care today- >50% coordination of care- due to team conference and discussing Superficial venous thrombosis with pt.    LOS: 6 days A FACE TO FACE EVALUATION WAS PERFORMED  Dartanyan Deasis 02/17/2022, 8:22 AM

## 2022-02-17 NOTE — Progress Notes (Signed)
Physical Therapy Session Note  Patient Details  Name: Shelby Fleming MRN: [id number] Date of Birth: Jan 15, 1946  Today's Date: 02/17/2022 PT Individual Time: 1130-1200 PT Individual Time Calculation (min): 30 min   Short Term Goals: Week 1:  PT Short Term Goal 1 (Week 1): Pt will transfer sup to sit w/ log roll w/ min A. PT Short Term Goal 2 (Week 1): Pt will transfer sit to stand w/ CGA. PT Short Term Goal 3 (Week 1): Pt will amb 100' w/ RW and CGA. PT Short Term Goal 4 (Week 1): Address stairs  Skilled Therapeutic Interventions/Progress Updates:    Pt received seated in w/c in room, agreeable to PT session. Pt reports ongoing pain in her R upper arm/bicep region, not rated, declines intervention. Sit to stand with Supervision to RW during session. Ambulatory transfer to toilet with RW and Supervision. Toilet transfer with CGA from elevated BSC over toilet and RW. Pt is min A for clothing management, setup A for pericare. Ambulation 2 x 200 ft with RW and Supervision for balance, focus on upright posture and increasing foot clearance during gait. Pt returned to w/c and left seated in w/c in room with needs in reach at end of session.  Therapy Documentation Precautions:  Precautions Precautions: Fall, Cervical Required Braces or Orthoses: Cervical Brace Cervical Brace: Hard collar, At all times Restrictions Weight Bearing Restrictions: No     Therapy/Group: Individual Therapy   Excell Seltzer, PT, DPT, CSRS 02/17/2022, 12:45 PM

## 2022-02-17 NOTE — Progress Notes (Signed)
Patient ID: Shelby Fleming, female   DOB: [birth year], 76 y.o.   MRN: [id number]  SW returned phone call to pt dtr Shelby Fleming to inform that team conference is for staff and SW will follow-up with updates once there is team conference.   *SW met with pt in room to provide updates and inform on d/c date 6/23. Pt encouraged to continue to work hard as he has been and recovery will take time. Pt is very optimistic.   1609- SW spoke with pt daughter Shelby Fleming to discuss above. Asks if hospital bed would be appropriate. SW shared will speak with medical team to discuss further and follow-up. Family edu scheduled for Monday (6/19) 9am-12pm.  Shelby Fleming, MSW, [place] Office: [phone] Cell: [phone] Fax: [phone]

## 2022-02-17 NOTE — Progress Notes (Signed)
Speech Language Pathology Daily Session Note  Patient Details  Name: Francely Craw MRN: [id number] Date of Birth: April 24, 1946  Today's Date: 02/17/2022 SLP Individual Time: 0932-3557 SLP Individual Time Calculation (min): 45 min  Short Term Goals: Week 1: SLP Short Term Goal 1 (Week 1): STG=LTG due to short ELOS  Skilled Therapeutic Interventions: Pt seen for skilled ST with focus on swallowing goals, NT reports pt meal was held for ST session. SLP assisting patient transferring from bed to wheelchair, pt able to set up meal and open containers with extra time. Pt reports throat pain and dry mouth/throat symptoms have diminished, observed consuming Dys 3/thin breakfast with Mod I for use of swallow precautions as trained. Pt did have frequent throat clear after swallow with both solids and liquids, no change in vocal quality. Pt reports this is likely her baseline due to previous ACDF surgery and hx throat clearing. Pt left in wheelchair with nursing present for meds, cont ST POC.     Pain Pain Assessment Pain Scale: 0-10 Pain Score: 0-No pain  Therapy/Group: Individual Therapy  Dewaine Conger 02/17/2022, 9:28 AM

## 2022-02-17 NOTE — Progress Notes (Signed)
Occupational Therapy Session Note  Patient Details  Name: Shelby Fleming MRN: [id number] Date of Birth: 10/08/1945  Today's Date: 02/17/2022 OT Individual Time: 5361-4431 OT Individual Time Calculation (min): 80 min    Short Term Goals: Week 1:  OT Short Term Goal 1 (Week 1): Patient will complete toilet transfer using RW with Min A OT Short Term Goal 2 (Week 1): Patient will increase BUE A/ROM to [facility] in order to increase her ability to complete upper body bathing and dressing with less difficulty. OT Short Term Goal 3 (Week 1): Patient will increase her ability to comolete upper and lower body bathing with Mod assist with use of AE as needed. OT Short Term Goal 4 (Week 1): Patient will demonstrate improved activity tolerance while completing morning self care routine for 60 minutes requiring only 2-3 rest breaks.  Skilled Therapeutic Interventions/Progress Updates:   Skilled OT intervention with emphasis on improving participation with basic self care skills.  Patient received up in wheelchair.  Discussed shower with nursing, but no extra pads found for collar.  RN ordered rigid cervical shower to be used for shower - patient aware, and hopeful to shower tomorrow (collar delivered during this session)   Patient bathed self at sink, with care for addressing yeast issues in folds under breasts and belly.  Patient able to better reach for feet for donning pants today - demonstrated reacher and patient able to use to aide with LB dressing.  Patient able to direct much of her own care.  Sit to stand with contact guard to supervision/cueing.  Continues to have difficulty reaching to feet.  Continue practice with adaptive equipment for increased independence.  Patient left up in wheelchair with safety belt in place and engaged and personal items within reach.     Therapy Documentation Precautions:  Precautions Precautions: Fall, Cervical Required Braces or Orthoses: Cervical Brace Cervical Brace: Hard  collar, At all times Restrictions Weight Bearing Restrictions: No  Pain: Pain Assessment Pain Scale: 0-10 Pain Score: 0-No pain     Therapy/Group: Individual Therapy  Mariah Milling 02/17/2022, 12:22 PM

## 2022-02-17 NOTE — Plan of Care (Signed)
  Problem: Consults Goal: RH SPINAL CORD INJURY PATIENT EDUCATION Description:  See Patient Education module for education specifics.  Outcome: Progressing Goal: Skin Care Protocol Initiated - if Braden Score 18 or less Description: If consults are not indicated, leave blank or document N/A Outcome: Progressing   Problem: SCI BOWEL ELIMINATION Goal: RH STG MANAGE BOWEL WITH ASSISTANCE Description: STG Manage Bowel with Supervision Assistance. Outcome: Progressing Goal: RH STG SCI MANAGE BOWEL WITH MEDICATION WITH ASSISTANCE Description: STG SCI Manage bowel with medication with supervision assistance. Outcome: Progressing   Problem: SCI BLADDER ELIMINATION Goal: RH STG MANAGE BLADDER WITH ASSISTANCE Description: STG Manage Bladder With Supervision Assistance Outcome: Progressing Goal: RH STG MANAGE BLADDER WITH MEDICATION WITH ASSISTANCE Description: STG Manage Bladder With Medication With Supervision Assistance. Outcome: Progressing   Problem: RH SKIN INTEGRITY Goal: RH STG MAINTAIN SKIN INTEGRITY WITH ASSISTANCE Description: STG Maintain Skin Integrity With Supervision Assistance. Outcome: Progressing Goal: RH STG ABLE TO PERFORM INCISION/WOUND CARE W/ASSISTANCE Description: STG Able To Perform Incision/Wound Care With Supervision Assistance. Outcome: Progressing   Problem: RH SAFETY Goal: RH STG ADHERE TO SAFETY PRECAUTIONS W/ASSISTANCE/DEVICE Description: STG Adhere to Safety Precautions With Cues and Reminders. Outcome: Progressing Goal: RH STG DECREASED RISK OF FALL WITH ASSISTANCE Description: STG Decreased Risk of Fall With Supervision Assistance. Outcome: Progressing   Problem: RH PAIN MANAGEMENT Goal: RH STG PAIN MANAGED AT OR BELOW PT'S PAIN GOAL Description: < 3 on a 0-10 pain scale. Outcome: Progressing   Problem: RH KNOWLEDGE DEFICIT SCI Goal: RH STG INCREASE KNOWLEDGE OF SELF CARE AFTER SCI Description: Patient will demonstrate knowledge of self-care,  medication/pain management, bowel/bladder management, skin/wound care with educational materials and handouts provided by staff independently at discharge. Outcome: Progressing

## 2022-02-17 NOTE — Patient Care Conference (Signed)
Inpatient RehabilitationTeam Conference and Plan of Care Update Date: 02/17/2022   Time: 11:01 AM    Patient Name: Shelby Fleming      Medical Record Number: [id number]  Date of Birth: 21-Sep-1945 Sex: Female         Room/Bed: 4M01C/4M01C-01 Payor Info: Payor: [employer] MEDICARE / Plan: [facility] MEDICARE / Product Type: *No Product type* /    Admit Date/Time:  02/11/2022  5:38 PM  Primary Diagnosis:  Central cord syndrome [facility])  Hospital Problems: Principal Problem:   Central cord syndrome [facility]) Active Problems:   Pressure injury of skin    Expected Discharge Date: Expected Discharge Date: 02/25/22  Team Members Present: Physician leading conference: Dr. Courtney Heys Social Worker Present: [name], [place] Nurse Present: Dorthula Nettles, RN PT Present: Excell Seltzer, PT OT Present: Roanna Epley, [place], OT SLP Present: Lillie Columbia, SLP PPS Coordinator present : Gunnar Fusi, SLP     Current Status/Progress Goal Weekly Team Focus  Bowel/Bladder   Continent B&B, Monitoring PVR's Q6 hours-WNL, taking colace bid & miralax daily  Remain Continent of B&B  Continue to get up to [facility] or BR for all toileting needs.   Swallow/Nutrition/ Hydration   Dys 3/thin, regular snacks - Supervision-Mod I  Mod I  educated and discharge   ADL's   bathing-max A; dressing-max A; functional tranfsers-CGA/min A; toileting-max A  bathing-min A; LB dressing-min A; toileting/toilet tranfsers-supervision  BADL retraining, tranfsers, standing balance, safety awareness, educaiton   Mobility   Supervision bed mobility, CGA transfers with RW, CGA gait up to 200 ft RW, min A 3" stairs  Supervision overall, CGA stairs  endurance, balance, functional mobility including gait and stairs   Communication             Safety/Cognition/ Behavioral Observations            Pain   Pain managed with scheduled ultram every 6 hours and scheduled neurontin  Pain level 2 or less on pain scale  manage  pain on current regimen   Skin   left knee abrasion with small scab, foam dressing to stage 2 to buttocks, Honeycomb dressing in place to left anterior neck  remain free of new breakdown  monitor current wounds for healing every shift     Discharge Planning:  d/c to home and dtr will provide 24/7 care to her mother.   Team Discussion: Superficial clot to distal leg, not a DVT. Maceration to skin under breast. Daily K+ supplement. Continent B/B, RUE pain. Left knee abrasion, stage 2 right buttock, cervical incision. Lives alone, daughter to assist at discharge. Dys 3, thin liquids per patient preference.   Patient on target to meet rehab goals: yes, supervision to min assist goals. Currently CGA overall, min assist stairs. Max assist lower body, bathing and dressing. Prior swallowing issues.  *See Care Plan and progress notes for long and short-term goals.   Revisions to Treatment Plan:  Adjusting medications   Teaching Needs: Family education, medication/pain management, skin/wound care, transfer/gait training, etc.   Current Barriers to Discharge: Decreased caregiver support, Wound care, Lack of/limited family support, and pain, lives alone.  Possible Resolutions to Barriers: Family education Follow-up PT/OT/SLP Order recommended DME     Medical Summary Current Status: put on Daily KCL for low K+- pain in RUE- elbow to shoulder- skin-maceration under breasts; honeycomb on neck; stage II on buttocks-  Barriers to Discharge: Decreased family/caregiver support;Medical stability;Home enviroment access/layout;Neurogenic Bowel & Bladder;Weight bearing restrictions;Weight  Barriers to Discharge  Comments: daughter to care for her- barrier- lives alone- daughter to help Possible Resolutions to [employer]: slow progress with OT- max A now- needs higher level- lagging behind- goals min A to supervision- SLP- swallow- D3 diet and thin lqiquids- will likely be discharged this week  from SLP- goals to recheck K+ and titrate meds- d/c 6/21   Continued Need for Acute Rehabilitation Level of Care: The patient requires daily medical management by a physician with specialized training in physical medicine and rehabilitation for the following reasons: Direction of a multidisciplinary physical rehabilitation program to maximize functional independence : Yes Medical management of patient stability for increased activity during participation in an intensive rehabilitation regime.: Yes Analysis of laboratory values and/or radiology reports with any subsequent need for medication adjustment and/or medical intervention. : Yes   I attest that I was present, lead the team conference, and concur with the assessment and plan of the team.   Cristi Loron 02/17/2022, 2:48 PM

## 2022-02-17 NOTE — Progress Notes (Signed)
Physical Therapy Session Note  Patient Details  Name: Shelby Fleming MRN: [id number] Date of Birth: 12-20-1945  Today's Date: 02/17/2022 PT Individual Time: 5852-7782 PT Individual Time Calculation (min): 58 min   Short Term Goals: Week 1:  PT Short Term Goal 1 (Week 1): Pt will transfer sup to sit w/ log roll w/ min A. PT Short Term Goal 2 (Week 1): Pt will transfer sit to stand w/ CGA. PT Short Term Goal 3 (Week 1): Pt will amb 100' w/ RW and CGA. PT Short Term Goal 4 (Week 1): Address stairs  Skilled Therapeutic Interventions/Progress Updates:     Pt received seated in [facility] and agrees to therapy. Reports some pain in R arm but number not provided. PT provides rest breaks as needed to manage pain. WC transport to gym for time management. Pt performs sit to stand holding onto RW and PT providing CGA. Pt ambulates x350' with RW. Following, PT provides demonstration on education on sequencing and hand placement to optimize safety of sit<>stand, as well as decreasing WB through RW for energy conservation. Pt ambulates additional 175' with improved mechanics and incorporating PT feedback.  Pt performs x5 reps of sit to stand without using upper extremities to emphasize strengthening of legs as well as balance training and optimizing mechanics. Pt completes with CGA following PT demonstrating and cues for sequencing. Pt then performs trunk rotations holding onto ball in standing, x10, with PT providing minA at hips to facilitate rotation.   Pt ambulates x50' without AD to increase balance challenge, with PT providing CGA and cues to increase stride length and gait speed to decrease risk for falls. Following seated rest break, pt ambulates additional x70' without AD, with cues for increasing speed to improve balance. WC transport back to room. Pt left seated with all needs within reach.  Therapy Documentation Precautions:  Precautions Precautions: Fall, Cervical Required Braces or Orthoses: Cervical  Brace Cervical Brace: Hard collar, At all times Restrictions Weight Bearing Restrictions: No    Therapy/Group: Individual Therapy  Breck Coons, PT, DPT 02/17/2022, 2:11 PM

## 2022-02-17 NOTE — Progress Notes (Signed)
Orthopedic Tech Progress Note Patient Details:  Shelby Fleming 07/08/1946 [id number]  RN called requesting a PHILLY COLLAR for patient, when I went to drop it off she was working with OT at the time.   Ortho Devices Type of Ortho Device: [place] cervical collar Ortho Device/Splint Location: NECK Ortho Device/Splint Interventions: Ordered   Post Interventions Patient Tolerated: Well Instructions Provided: Care of device  Janit Pagan 02/17/2022, 10:04 AM

## 2022-02-18 DIAGNOSIS — S14129D Central cord syndrome at unspecified level of cervical spinal cord, subsequent encounter: Secondary | ICD-10-CM

## 2022-02-18 DIAGNOSIS — I10 Essential (primary) hypertension: Secondary | ICD-10-CM

## 2022-02-18 DIAGNOSIS — K5900 Constipation, unspecified: Secondary | ICD-10-CM

## 2022-02-18 DIAGNOSIS — M79601 Pain in right arm: Secondary | ICD-10-CM

## 2022-02-18 NOTE — Progress Notes (Signed)
Patient ID: Shelby Fleming, female   DOB: 1946/03/21, 76 y.o.   MRN: [id number]  SW spoke with pt dtr [name] to inform on medical team in agreement with hospital bed. Reports they are now considering an adjustable bed if able to find one. Will confirm by Friday if needed so this SW can order. SW asked to change fam edu from 9am-12pm to 1pm-4pm to avoid overlapping with another family. Family in agreement to time change 1pm-4pm on Monday (6/19).   [name], MSW, [place] Office: [phone] Cell: [phone] Fax: [phone]

## 2022-02-18 NOTE — Patient Instructions (Addendum)
          AAROM FLEXION ASSISTED  While sitting or standing with your arm at your side, grasp your affected arm and slowly raise it up and forward towards overhead.  Your affected arm should be relaxed and your other arm performing the work.   10 times.   AAROM SHOULDER CIRCLES  Grab your wrist and move your affected arm into circles as shown. Allow your unaffected arm to assist in lifting your affected arm through the movement. Perform clockwise circles for a few revolutions and then reverse the direction and perform counterclockwise circles.  10 times each direction.   EXTERNAL ROTATION - ER  Stand with your arm at your side and elbow bent to 90 degrees.   Squeeze your shoulder blade back and down toward your buttocks and hold that position.   Start with your forearm and wrist near your stomach. Next, roll your arm outward from your stomach area while maintaining your arm near your side and with your shoulder blade held down and back the entire time. Return to starting position and repeat.  10 times.    Exercises  Shoulder and Elbow Flexion/Extension 1. Place towel on the table 2. Place weaker arm on top of towel. 3. Using the strength of your shoulder and elbow, stretch your arm out in front of you 4. Try to keep arm as straight as possible  5. Using the strength of your weaker arm, bend your elbow. 6. Pull arm back towards you.Complete 10 times.    Shoulder Abduction/Adduction 1. Using the strength of your shoulder, slide arm out away from you. 2. Try to keep elbow straight and move only at the shoulder. 3. Slide arm in and around toward the opposite side of your body. 4. Try to keep elbow straight and move only at the shoulder. Complete 10 times.

## 2022-02-18 NOTE — Plan of Care (Signed)
  Problem: Consults Goal: RH SPINAL CORD INJURY PATIENT EDUCATION Description:  See Patient Education module for education specifics.  Outcome: Progressing Goal: Skin Care Protocol Initiated - if Braden Score 18 or less Description: If consults are not indicated, leave blank or document N/A Outcome: Progressing   Problem: SCI BOWEL ELIMINATION Goal: RH STG MANAGE BOWEL WITH ASSISTANCE Description: STG Manage Bowel with Supervision Assistance. Outcome: Progressing Goal: RH STG SCI MANAGE BOWEL WITH MEDICATION WITH ASSISTANCE Description: STG SCI Manage bowel with medication with supervision assistance. Outcome: Progressing   Problem: SCI BLADDER ELIMINATION Goal: RH STG MANAGE BLADDER WITH ASSISTANCE Description: STG Manage Bladder With Supervision Assistance Outcome: Progressing Goal: RH STG MANAGE BLADDER WITH MEDICATION WITH ASSISTANCE Description: STG Manage Bladder With Medication With Supervision Assistance. Outcome: Progressing   Problem: RH SKIN INTEGRITY Goal: RH STG MAINTAIN SKIN INTEGRITY WITH ASSISTANCE Description: STG Maintain Skin Integrity With Supervision Assistance. Outcome: Progressing Goal: RH STG ABLE TO PERFORM INCISION/WOUND CARE W/ASSISTANCE Description: STG Able To Perform Incision/Wound Care With Supervision Assistance. Outcome: Progressing   Problem: RH SAFETY Goal: RH STG ADHERE TO SAFETY PRECAUTIONS W/ASSISTANCE/DEVICE Description: STG Adhere to Safety Precautions With Cues and Reminders. Outcome: Progressing Goal: RH STG DECREASED RISK OF FALL WITH ASSISTANCE Description: STG Decreased Risk of Fall With Supervision Assistance. Outcome: Progressing   Problem: RH PAIN MANAGEMENT Goal: RH STG PAIN MANAGED AT OR BELOW PT'S PAIN GOAL Description: < 3 on a 0-10 pain scale. Outcome: Progressing   Problem: RH KNOWLEDGE DEFICIT SCI Goal: RH STG INCREASE KNOWLEDGE OF SELF CARE AFTER SCI Description: Patient will demonstrate knowledge of self-care,  medication/pain management, bowel/bladder management, skin/wound care with educational materials and handouts provided by staff independently at discharge. Outcome: Progressing

## 2022-02-18 NOTE — Progress Notes (Signed)
PROGRESS NOTE   Subjective/Complaints:   She was able to shower today and feels much better after this.     ROS:    Pt denies SOB, abd pain, CP, N/V/C/D, HA   Objective:   VAS [place] LOWER EXTREMITY VENOUS (DVT)  Result Date: 02/16/2022  Lower Venous DVT Study Patient Name:  Shelby Fleming  Date of Exam:   02/16/2022 Medical Rec #: [id number]    Accession #:    [id number] Date of Birth: 1946-06-28     Patient Gender: F Patient Age:   76 years Exam Location:  [facility] Procedure:      VAS [place] LOWER EXTREMITY VENOUS (DVT) Referring Phys: Lauraine Rinne --------------------------------------------------------------------------------  Indications: Swelling.  Comparison Study: 02/12/22, positive acute right superficial thrombophlebitis of                   the small saphenous vein. Performing Technologist: Bobetta Lime  Examination Guidelines: A complete evaluation includes B-mode imaging, spectral Doppler, color Doppler, and power Doppler as needed of all accessible portions of each vessel. Bilateral testing is considered an integral part of a complete examination. Limited examinations for reoccurring indications may be performed as noted. The reflux portion of the exam is performed with the patient in reverse Trendelenburg.  +---------+---------------+---------+-----------+------------+----------------+ RIGHT    CompressibilityPhasicitySpontaneityProperties  Thrombus Aging   +---------+---------------+---------+-----------+------------+----------------+ CFV      Full           Yes      Yes                                     +---------+---------------+---------+-----------+------------+----------------+ SFJ      Full                                                            +---------+---------------+---------+-----------+------------+----------------+ FV Prox  Full                                                             +---------+---------------+---------+-----------+------------+----------------+ FV Mid   Full                                                            +---------+---------------+---------+-----------+------------+----------------+ FV DistalFull                                                            +---------+---------------+---------+-----------+------------+----------------+  PFV      Full                                                            +---------+---------------+---------+-----------+------------+----------------+ POP      Full           Yes      Yes                                     +---------+---------------+---------+-----------+------------+----------------+ PTV      Full                                                            +---------+---------------+---------+-----------+------------+----------------+ PERO     Full                                                            +---------+---------------+---------+-----------+------------+----------------+ SSV      None                               with        Age                                                          striations  Indeterminate    +---------+---------------+---------+-----------+------------+----------------+ No progression noted of the thrombus in the small saphenous vein.  +----+---------------+---------+-----------+----------+--------------+ LEFTCompressibilityPhasicitySpontaneityPropertiesThrombus Aging +----+---------------+---------+-----------+----------+--------------+ CFV Full           Yes      Yes                                 +----+---------------+---------+-----------+----------+--------------+    Summary: RIGHT: - Findings consistent with age indeterminate superficial vein thrombosis involving the right small saphenous vein. - Findings appear essentially unchanged compared to previous examination. - There is no evidence of deep  vein thrombosis in the lower extremity.  - No cystic structure found in the popliteal fossa.  LEFT: - No evidence of common femoral vein obstruction. - No cystic structure found in the popliteal fossa.  *See table(s) above for measurements and observations. Electronically signed by Servando Snare MD on 02/16/2022 at 6:03:15 PM.    Final    Recent Labs    02/16/22 0517  WBC 7.7  HGB 9.0*  HCT 28.8*  PLT 403*     Recent Labs    02/16/22 0517  NA 138  K 3.5  CL 98  CO2 30  GLUCOSE 111*  BUN <5*  CREATININE 0.73  CALCIUM 8.8*     Intake/Output Summary (Last 24 hours)  at 02/18/2022 0754 Last data filed at 02/17/2022 1745 Gross per 24 hour  Intake 717 ml  Output --  Net 717 ml      Pressure Injury 02/11/22 Buttocks Right Stage 2 -  Partial thickness loss of dermis presenting as a shallow open injury with a red, pink wound bed without slough. stage 2 right side along bony prominence - verified by Eyvonne Mechanic, RN (Active)  02/11/22 1830  Location: Buttocks  Location Orientation: Right  Staging: Stage 2 -  Partial thickness loss of dermis presenting as a shallow open injury with a red, pink wound bed without slough.  Wound Description (Comments): stage 2 right side along bony prominence - verified by Eyvonne Mechanic, RN  Present on Admission: Yes    Physical Exam: Vital Signs Blood pressure (!) 114/57, pulse 94, temperature 98 F (36.7 C), temperature source Oral, resp. rate 18, height '5\' 4"'$  (1.626 m), weight 87.2 kg, SpO2 92 %.      General: awake, alert, appropriate, in chair HENT: conjugate gaze; oropharynx moist- wearing cervical collar- honeycomb dressing in place CV: RRR Pulmonary: CTA B/L; no W/R/R- good air movement GI: soft, NT, ND, (+)BS Psychiatric: appropriate, pleasant  Neurological: Ox3 Extremities: No clubbing, cyanosis, or edema. Pulses are 2+ Psych: Pt's affect is appropriate. Pt is cooperative Skin: Incision site is clean dry and intact does have areas of psoriasis.  Honeycomb on cervical dressing Note: 1) Patient does have red moist macerated area under her bilateral breasts, which is improving. She was seen by [facility] nurse with recommendations for silver impregnated fabric to be used for 5 days at a time.   2) patient also has 1-+2 edema nonpitting bilateral ankles, medial malleolus and lateral maleollus,  1+ LE edema- looks better  Has TED hose and ACE wrap in place, during day. Neuro: Patient is alert.  Oriented x3 and follows commands well Musculoskeletal: She has hard cervical collar in place, arthritis of small hand joints.  RUE 4/5, LUE 3/5, 4/5 in lower extremities bilaterally.  Assessment/Plan: 1. Functional deficits which require 3+ hours per day of interdisciplinary therapy in a comprehensive inpatient rehab setting. Physiatrist is providing close team supervision and 24 hour management of active medical problems listed below. Physiatrist and rehab team continue to assess barriers to discharge/monitor patient progress toward functional and medical goals  Care Tool:  Bathing    Body parts bathed by patient: Right arm, Left arm, Chest, Abdomen, Front perineal area, Right upper leg, Left upper leg, Face   Body parts bathed by helper: Left lower leg, Right lower leg, Buttocks     Bathing assist Assist Level: Moderate Assistance - Patient 50 - 74%     Upper Body Dressing/Undressing Upper body dressing   What is the patient wearing?: Bra, Pull over shirt, Orthosis (cervical collar in place) Orthosis activity level: Performed by helper  Upper body assist Assist Level: Moderate Assistance - Patient 50 - 74%    Lower Body Dressing/Undressing Lower body dressing      What is the patient wearing?: Underwear/pull up, Pants     Lower body assist Assist for lower body dressing: Moderate Assistance - Patient 50 - 74%     Toileting Toileting    Toileting assist Assist for toileting: Maximal Assistance - Patient 25 - 49%      Transfers Chair/bed transfer  Transfers assist     Chair/bed transfer assist level: Supervision/Verbal cueing     Locomotion Ambulation   Ambulation assist      Assist level:  Supervision/Verbal cueing Assistive device: Walker-rolling Max distance: 200'   Walk 10 feet activity   Assist     Assist level: Supervision/Verbal cueing Assistive device: Walker-rolling   Walk 50 feet activity   Assist Walk 50 feet with 2 turns activity did not occur: Safety/medical concerns  Assist level: Supervision/Verbal cueing Assistive device: Walker-rolling    Walk 150 feet activity   Assist Walk 150 feet activity did not occur: Safety/medical concerns  Assist level: Supervision/Verbal cueing Assistive device: Walker-rolling    Walk 10 feet on uneven surface  activity   Assist Walk 10 feet on uneven surfaces activity did not occur: Safety/medical concerns         Wheelchair     Assist Is the patient using a wheelchair?: Yes Type of Wheelchair: Manual    Wheelchair assist level: Dependent - Patient 0%      Wheelchair 50 feet with 2 turns activity    Assist        Assist Level: Dependent - Patient 0%   Wheelchair 150 feet activity     Assist      Assist Level: Dependent - Patient 0%   Blood pressure (!) 114/57, pulse 94, temperature 98 F (36.7 C), temperature source Oral, resp. rate 18, height '5\' 4"'$  (1.626 m), weight 87.2 kg, SpO2 92 %.    Medical Problem List and Plan: 1. 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  Cervical collar as directed             -patient may shower             -ELOS/Goals: 5-7 days S             Con't CIR- PT and OT  -She would likely benefit from hospital bed  -Discharge date expected 6/21 2.   Antithrombotics: -DVT/anticoagulation:  Pharmaceutical: Lovenox check vascular study  6/13- has superficial Venous thrombosis very low down- will not treat- per CHEST guidelines             -antiplatelet therapy: N/A 3. Postoperative pain: continue Tramadol 50 mg every 6 hours, Neurontin 100 mg twice daily, Flexeril 10 mg 3 times daily as needed, oxycodone as needed             6/8- pain controlled with scheduled tramadol  6/12- pain controlled, but still bothersome in R upper arm still  6/14 She continues to have some R arm pain, its improved with oxycodone today 4. Mood: Provide emotional support             -antipsychotic agents: N/A 5. Neuropsych: This patient is capable of making decisions on her own behalf. 6. Skin/Wound Care: Routine skin checks 7. Fluids/Electrolytes/Nutrition: Routine in and outs with follow-up chemistries 8.  Neurogenic bowel and bladder.  Check PVR.  Patient had initially refused intermittent catheterization.  Establish bowel program             6/8- will order bladder scans to see if retaining still- not clear if needs bowel program quite yet.              6/9- is voiding- will con't to monitor to see if needs caths 6/11 Patient is voiding okay without needing caths  6/12- LBM yesterday small- bladder scans <100cc- con'to monitor 9.  Dysphagia.  Improved.  Diet advanced to regular 10.  Hypertension.  Patient on lisinopril  40 mg daily prior to admission.  Resume as needed             6/8- BP well controlled off Lisinopril  6/14 BP continues to be well controlled, continue to hold lisinopril    02/18/2022    5:06 AM 02/17/2022    7:38 PM 02/17/2022    1:05 PM  Vitals with BMI  Systolic [phone]  Diastolic 57 81 51  Pulse 94 100 110    11.  Hyperlipidemia.  Restart Lovaza 2 g twice daily 12.  Psoriasis.  Restart Oteleza 30 mg twice daily             6/8- will send Otezla and cream to pharmacy so can use.  6/11 patient to continue Oteleza 30 mg BID 13.  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 Hypokalemia             6/8- K+ 2.9- will give KCL 40 mEq x3 and recheck in AM- was last 3.7             6/9- K+ 4.2 this AM- will recheck Monday            6/12- K+ 3.5- will put on low daily dose of KCL 10 mEq daily and recheck Thursday  6/13- will recheck Thursday  15. MASD under breasts             6/9- will consult WOC for recommendations- is severe. With open skin 16. Constipation             6/9- Will order sorbitol after therapy.  6/10 Patient's last BM was today sorbitol worked well.  6/12- LBM yesterday  6/14 BM today, continue to follow 17. Moderate malnutrition             6/9- will add Ensure BID between meals 18. Dry mouth             6/9- doesn't have thrush on exam- will order Biotene mouthwash   6/13- Mouth less dry with Biotene 19.  Bilateral lower ankle edema 6/10 patient says that she has had edema that started on Thursday.  She says that it is mostly in the ankles wonders if it could be due to her psoriatic arthritis.  Some of the swelling on the anterior right ankle has subsided overall there is greater edema in the left ankle versus right there is no edema of the legs and +1 nonpitting edema the dorsal surface of both feet.  Edema is +2 nonpitting for the ankles.  Encourage use of elevation for suspected dependent edema if this does not work may also add on use of Ace wrap. 6/11 PT placed TED hose (ankle high) on pt then placed ACE wrap over. Swelling of lower legs and ankles less today than yesterday.  Continue elevation of legs with pillows.   6/12- LE edema looking better- con't regimen     LOS: 7 days A FACE TO FACE EVALUATION WAS PERFORMED  Jennye Boroughs 02/18/2022, 7:54 AM

## 2022-02-18 NOTE — Progress Notes (Signed)
Physical Therapy Session Note  Patient Details  Name: Shawnay Bramel MRN: [id number] Date of Birth: 1946/06/24  Today's Date: 02/18/2022 PT Individual Time: 1030-1130 PT Individual Time Calculation (min): 60 min   Short Term Goals: Week 1:  PT Short Term Goal 1 (Week 1): Pt will transfer sup to sit w/ log roll w/ min A. PT Short Term Goal 2 (Week 1): Pt will transfer sit to stand w/ CGA. PT Short Term Goal 3 (Week 1): Pt will amb 100' w/ RW and CGA. PT Short Term Goal 4 (Week 1): Address stairs  Skilled Therapeutic Interventions/Progress Updates:    Pt received seated in w/c in room, agreeable to PT session. No complaints of pain. Sit to stand and stand pivot transfer w/c to bed with no AD and CGA. Sitting EOB to long-sit in bed with mod A for BLE management. Assisted pt with donning BLE knee-high TEDs and ACE wrap for edema management. Pt returned to EOB with Supervision. Sit to stand and transfers with RW and Supervision for remainder of session. Ambulatory transfer to toilet with RW and Supervision. Toilet transfer with RW and Supervision. Pt requires some assist for thoroughness of pericare and for clothing management. Ambulation to/from therapy gym with RW and Supervision. Ascend/descend 8 x 3" stairs with 2 handrails and CGA for balance, alternating gait pattern. Progression to 8 x 6" stair navigation with 2 handrails and min A for balance, step-to gait pattern. Pt exhibits increased difficulty ascending with LLE as compared to RLE. Per pt report her stairs at home have no handrails, encouraged her to have family install these prior to d/c home. Sidesteps L/R 1 x 10 ft with RW and CGA for balance for hip strengthening. Pt returned to w/c and left seated in chair with BLE elevated, needs in reach at end of session.  Therapy Documentation Precautions:  Precautions Precautions: Fall, Cervical Required Braces or Orthoses: Cervical Brace Cervical Brace: Hard collar, At all  times Restrictions Weight Bearing Restrictions: No       Therapy/Group: Individual Therapy   Excell Seltzer, PT, DPT, CSRS 02/18/2022, 11:48 AM

## 2022-02-18 NOTE — Progress Notes (Signed)
Occupational Therapy Weekly Progress Note  Patient Details  Name: Shelby Fleming MRN: [id number] Date of Birth: 27-Feb-1946  Beginning of progress report period: February 12, 2022 End of progress report period: February 18, 2022  Today's Date: 02/18/2022 Session 1: OT Individual Time: 8:00-915 Total time: 75 min.    Session 2:  OT individual time: 1300- 1415 Total time: 75 min   Patient has met 3 of 4 short term goals.  Pt has partially met UB goal due to continued weakness in the RUE. Patient has limited shoulder ROM at this time.  Patient continues to demonstrate the following deficits: muscle weakness and therefore will continue to benefit from skilled OT intervention to enhance overall performance with BADL and Reduce care partner burden.  Patient progressing toward long term goals..  Continue plan of care.  OT Short Term Goals Week 2:  OT Short Term Goal 1 (Week 2): STG = LTG (due to ELOS)  Skilled Therapeutic Interventions/Progress Updates:    Session 1: S: Pt up in w/c and is excited to take a shower this AM.   Patient participated in skilled OT session focusing on ADL re-training while taking a shower. Utilized BSC over toilet to complete toileting. Pt was able to push down pants and underwear prior to sitting on toilet. Walk-in shower with tub bench used. Therapist educated patient on activity modification and compensatory techniques while bathing seated and utilizing long handled sponge to reach lower extremities and back in order to improve independence. Patient was able to dry her self off seated prior to utilizing RW to ambulate from shower to wheelchair where she completed teeth brushing with supplies set-up prior. Therapist replaced Aspen collar pads as they were soiled. Daughter, [name] will take them home to wash and bring back. Elicited increased functional performance during UB dressing while providing VC for compensatory technique with patient requiring very minimal physical  assist to maneuver shirt and bra around cervical collar. Pt left in w/c with Daughter present. Suggested asking nursing to assist with donning TED hose due to time constraint.     P:  Practice lower body dressing with reacher and sock aid. Work on [employer] strength - AA/ROM.   Session 2: S: Requested to use the bathroom first at start of session.   - Functional mobility completed during session as patient utilized her RW with SBA to ambulate from w/c into bathroom to use toilet and to ambulate to and from Ortho Gym from room to elicit improved safety awareness, activity tolerance when standing and walking, and overall balance. Pt demonstrated very minimal fatigue and was provided 2 seated rest breaks towards end of session when participating in therapeutic activities in the therapy gym.  -Therapist educated patient on use of long handled reacher, long handled shoe horn, and sock aid in order to improve lower body dressing skills. Patient was provided with VC and visual demonstration prior to returning demonstration. Pt required very minimal assist to readjust adaptive equipment or VC for form and technique when practicing in room. - To increase right UE ROM and strength in order to utilize her RUE as her dominant extremity, patient participated in AA/ROM exercises seated at table, standing table wash, door wash, and mirror wash. Able to complete each task for 1' increments.   Patient education: Provided handout for Adaptive Equipment used during session for lower body dressing. Patient was educated and provided handout.  Provided verbal cues, visual demonstration. Patient returned demonstration and verbalized understanding.  Therapy Documentation Precautions:  Precautions  Precautions: Fall, Cervical Required Braces or Orthoses: Cervical Brace Cervical Brace: Hard collar, At all times Restrictions Weight Bearing Restrictions: No Pain: Pain Assessment Pain Scale: 0-10 Pain Score: 0-No  pain ADL: ADL Equipment Provided: Long-handled sponge Eating: Set up Where Assessed-Eating: Wheelchair Grooming: Minimal assistance Where Assessed-Grooming: Sitting at sink Upper Body Bathing: Setup Where Assessed-Upper Body Bathing: Shower Lower Body Bathing: Supervision/safety Where Assessed-Lower Body Bathing: Shower Upper Body Dressing: Moderate assistance (assist required with cervical collar and right arm) Where Assessed-Upper Body Dressing: Standing at sink Lower Body Dressing: Moderate assistance Where Assessed-Lower Body Dressing: Sitting at sink, Standing at sink Toileting: Minimal assistance Where Assessed-Toileting: Glass blower/designer: Close Engineer, maintenance (IT) Method: Stand pivot, Counselling psychologist: Bedside commode, Energy manager: Close supervision Social research officer, government Method: Heritage manager: Radio broadcast assistant, Grab bars    Therapy/Group: Individual Therapy  Ailene Ravel, OTR/L,CBIS  Supplemental OT - [facility]  02/18/2022, 10:12 AM

## 2022-02-19 DIAGNOSIS — K592 Neurogenic bowel, not elsewhere classified: Secondary | ICD-10-CM

## 2022-02-19 DIAGNOSIS — E876 Hypokalemia: Secondary | ICD-10-CM

## 2022-02-19 LAB — BASIC METABOLIC PANEL
Anion gap: 11 (ref 5–15)
BUN: <5 mg/dL — ABNORMAL LOW (ref 8–23)
CO2: 28 mmol/L (ref 22–32)
Calcium: 8.9 mg/dL (ref 8.9–10.3)
Chloride: 97 mmol/L — ABNORMAL LOW (ref 98–111)
Creatinine, Ser: 0.76 mg/dL (ref 0.44–1.00)
GFR, Estimated: >60 mL/min (ref >60)
Glucose, Bld: 125 mg/dL — ABNORMAL HIGH (ref 70–99)
Potassium: 3.6 mmol/L (ref 3.5–5.1)
Sodium: 136 mmol/L (ref 135–145)

## 2022-02-19 LAB — CBC
HCT: 28.8 % — ABNORMAL LOW (ref 36.0–46.0)
Hemoglobin: 9.1 g/dL — ABNORMAL LOW (ref 12.0–15.0)
MCH: 27.7 pg (ref 26.0–34.0)
MCHC: 31.6 g/dL (ref 30.0–36.0)
MCV: 87.8 fL (ref 80.0–100.0)
Platelets: 381 10*3/uL (ref 150–400)
RBC: 3.28 MIL/uL — ABNORMAL LOW (ref 3.87–5.11)
RDW: 14.3 % (ref 11.5–15.5)
WBC: 7 10*3/uL (ref 4.0–10.5)
nRBC: 0 % (ref 0.0–0.2)

## 2022-02-19 NOTE — Progress Notes (Signed)
ANTICOAGULATION CONSULT NOTE - Follow Up Consult  Pharmacy Consult for Enoxaparin Indication:  VTE  Allergies  Allergen Reactions   Atorvastatin Other (See Comments)    Muscle weakness   Codeine Nausea Only   Ezetimibe Other (See Comments)    Muscle weakness   Pitavastatin Other (See Comments)    Muscle weakness   Pravastatin Other (See Comments)    Weakness    Rosuvastatin Other (See Comments)    Muscle weakness    Patient Measurements: Height: '5\' 4"'$  (162.6 cm) Weight: 87.2 kg (192 lb 3.9 oz) IBW/kg (Calculated) : 54.7 Lovenox Dosing Weight: 87.2 kg  Vital Signs: Temp: 97.8 F (36.6 C) (06/15 0256) BP: 116/54 (06/15 0256) Pulse Rate: 95 (06/15 0256)  Labs: Recent Labs    02/19/22 0501  HGB 9.1*  HCT 28.8*  PLT 381  CREATININE 0.76    Estimated Creatinine Clearance: 62.9 mL/min (by C-G formula based on SCr of 0.76 mg/dL).  Assessment: 76 year old right-handed female with medical history significant for hypertension, and psoriasis who was admitted following a fall. US doppler from 6/8 showed acute superficial vein thrombosis in the right small saphenous vein. No evidence of DVT. Pharmacy consulted to transition form prophylactic to therapeutic Enoxaparin. Not on anticoagulation prior to admission.  Repeat duplex on 6/12 essentially unchanged, no progression of thrombus. Not planning oral anticoagulation at discharge, but therapeutic Enoxaparin to continue while inpatient, per d/w D. [place], PA-C on 6/14. CBC stable, bleeding reported.   Goal of Therapy:  Anti-Xa level 0.6-1 units/ml 4hrs after LMWH dose given Monitor platelets by anticoagulation protocol: Yes   Plan:  Continue Enoxaparin 90 mg SQ q12h. CBC and bmet twice a week while on Enoxaparin. Next labs planned 02/23/22.  Arty Baumgartner, RPh 02/19/2022,12:42 PM

## 2022-02-19 NOTE — Progress Notes (Addendum)
Speech Language Pathology Discharge Summary  Patient Details  Name: Joslyne Marshburn MRN: [id number] Date of Birth: [birth year]  Today's Date: 02/19/2022 SLP Individual Time: 3428-7681 SLP Individual Time Calculation (min): 45 min   Skilled Therapeutic Interventions:  Pt seen for skilled ST with focus on swallowing goals, pt consuming ~50% AM meal of dys 3 textures and continues to reports some decreased appetite. SLP and patient discussing continuation of Dys 3/thin diet at this time with regular snacks per patient preference. Reviewed standard swallow precautions which patient was able to provide teach back of information. Observed with consecutive sips of thin liquid via straw with no overt s/s aspiration. Pt continues to endorse some baseline throat clearing with intake as a result of previous ACDF, is planning to get a hospital bed or movable mattress to assist with positioning HOB ~30 degrees. Pt with all questions answered at this time.    Patient has met 2 of 2 long term goals.  Patient to discharge at overall Modified Independent level.   Clinical Impression/Discharge Summary:   Pt has made excellent gains meeting 2 out of 2 long term goals for swallowing. Pt is discharging at Mod I level for intake of Dys 3/thin liquids with regular snacks per patient preference. Pt demonstrating teach back of education for swallow function and recommendations for compensatory strategies. Pt will have intermittent assist/supervision at home from family and friends as needed.  Care Partner: Caregiver Able to Provide Assistance: Yes  Type of Caregiver Assistance: Physical;Cognitive  Recommendation:  None     Reasons for discharge: Treatment goals met   Patient/Family Agrees with Progress Made and Goals Achieved: Yes    Dewaine Conger 02/19/2022, 11:06 AM

## 2022-02-19 NOTE — Plan of Care (Signed)
  Problem: RH Balance Goal: LTG Patient will maintain dynamic standing balance (PT) Description: LTG:  Patient will maintain dynamic standing balance with assistance during mobility activities (PT) Flowsheets (Taken 02/19/2022 1605) LTG: Pt will maintain dynamic standing balance during mobility activities with:: (upgrade due to progress) Independent with assistive device  Note: Upgrade due to progress   Problem: Sit to Stand Goal: LTG:  Patient will perform sit to stand with assistance level (PT) Description: LTG:  Patient will perform sit to stand with assistance level (PT) Flowsheets (Taken 02/19/2022 1605) LTG: PT will perform sit to stand in preparation for functional mobility with assistance level: (Upgrade due to progress) Independent with assistive device Note: Upgrade due to progress   Problem: RH Bed to Chair Transfers Goal: LTG Patient will perform bed/chair transfers w/assist (PT) Description: LTG: Patient will perform bed to chair transfers with assistance (PT). Flowsheets (Taken 02/19/2022 1605) LTG: Pt will perform Bed to Chair Transfers with assistance level: (Upgrade due to progress) Independent with assistive device  Note: Upgrade due to progress   Problem: RH Furniture Transfers Goal: LTG Patient will perform furniture transfers w/assist (OT/PT) Description: LTG: Patient will perform furniture transfers  with assistance (OT/PT). Flowsheets (Taken 02/19/2022 1605) LTG: Pt will perform furniture transfers with assist:: (Upgrade due to progress) Independent with assistive device  Note: Upgrade due to progress   Problem: RH Ambulation Goal: LTG Patient will ambulate in controlled environment (PT) Description: LTG: Patient will ambulate in a controlled environment, # of feet with assistance (PT). Flowsheets (Taken 02/19/2022 1605) LTG: Pt will ambulate in controlled environ  assist needed:: (Upgrade due to progress) Independent with assistive device Note: Upgrade due to  progress Goal: LTG Patient will ambulate in home environment (PT) Description: LTG: Patient will ambulate in home environment, # of feet with assistance (PT). Flowsheets (Taken 02/19/2022 1605) LTG: Pt will ambulate in home environ  assist needed:: (Upgrade due to progress) Independent with assistive device Note: Upgrade due to progress

## 2022-02-19 NOTE — Plan of Care (Signed)
  Problem: Consults Goal: RH SPINAL CORD INJURY PATIENT EDUCATION Description:  See Patient Education module for education specifics.  Outcome: Progressing Goal: Skin Care Protocol Initiated - if Braden Score 18 or less Description: If consults are not indicated, leave blank or document N/A Outcome: Progressing   Problem: SCI BOWEL ELIMINATION Goal: RH STG MANAGE BOWEL WITH ASSISTANCE Description: STG Manage Bowel with Supervision Assistance. Outcome: Progressing Goal: RH STG SCI MANAGE BOWEL WITH MEDICATION WITH ASSISTANCE Description: STG SCI Manage bowel with medication with supervision assistance. Outcome: Progressing   Problem: SCI BLADDER ELIMINATION Goal: RH STG MANAGE BLADDER WITH ASSISTANCE Description: STG Manage Bladder With Supervision Assistance Outcome: Progressing Goal: RH STG MANAGE BLADDER WITH MEDICATION WITH ASSISTANCE Description: STG Manage Bladder With Medication With Supervision Assistance. Outcome: Progressing   Problem: RH SKIN INTEGRITY Goal: RH STG MAINTAIN SKIN INTEGRITY WITH ASSISTANCE Description: STG Maintain Skin Integrity With Supervision Assistance. Outcome: Progressing Goal: RH STG ABLE TO PERFORM INCISION/WOUND CARE W/ASSISTANCE Description: STG Able To Perform Incision/Wound Care With Supervision Assistance. Outcome: Progressing   Problem: RH SAFETY Goal: RH STG ADHERE TO SAFETY PRECAUTIONS W/ASSISTANCE/DEVICE Description: STG Adhere to Safety Precautions With Cues and Reminders. Outcome: Progressing Goal: RH STG DECREASED RISK OF FALL WITH ASSISTANCE Description: STG Decreased Risk of Fall With Supervision Assistance. Outcome: Progressing   Problem: RH PAIN MANAGEMENT Goal: RH STG PAIN MANAGED AT OR BELOW PT'S PAIN GOAL Description: < 3 on a 0-10 pain scale. Outcome: Progressing   Problem: RH KNOWLEDGE DEFICIT SCI Goal: RH STG INCREASE KNOWLEDGE OF SELF CARE AFTER SCI Description: Patient will demonstrate knowledge of self-care,  medication/pain management, bowel/bladder management, skin/wound care with educational materials and handouts provided by staff independently at discharge. Outcome: Progressing

## 2022-02-19 NOTE — Progress Notes (Signed)
Physical Therapy Weekly Progress Note  Patient Details  Name: Shelby Fleming MRN: [id number] Date of Birth: Dec 28, 1945  Beginning of progress report period: February 12, 2022 End of progress report period: February 19, 2022  Today's Date: 02/19/2022 PT Individual Time: 1100-1200; 1500-1530 PT Individual Time Calculation (min): 60 min and 30 min  Patient has met 4 of 4 short term goals. Pt has made good progress towards therapy goals. She is currently at Supervision level for bed mobility from a flat bed with use of a bedrail, however she does not tolerate laying flat for extended periods of time due to her cervical collar and precautions. She is at Supervision level for transfers and gait with RW up to 300 ft. She is able to navigate stairs with 1-2 handrails and CGA to min A. Pt's daughter will come in for family education next week prior to her d/c home.  Patient continues to demonstrate the following deficits muscle weakness, decreased cardiorespiratoy endurance, and decreased postural control and decreased balance strategies and therefore will continue to benefit from skilled PT intervention to increase functional independence with mobility.  Patient progressing toward long term goals..  Plan of care revisions: upgraded transfer and gait goals to mod I due to progress.  PT Short Term Goals Week 1:  PT Short Term Goal 1 (Week 1): Pt will transfer sup to sit w/ log roll w/ min A. PT Short Term Goal 1 - Progress (Week 1): Met PT Short Term Goal 2 (Week 1): Pt will transfer sit to stand w/ CGA. PT Short Term Goal 2 - Progress (Week 1): Met PT Short Term Goal 3 (Week 1): Pt will amb 100' w/ RW and CGA. PT Short Term Goal 3 - Progress (Week 1): Met PT Short Term Goal 4 (Week 1): Address stairs PT Short Term Goal 4 - Progress (Week 1): Met Week 2:  PT Short Term Goal 1 (Week 2): =LTG due to ELOS  Skilled Therapeutic Interventions/Progress Updates:    Session 1: Pt received seated in recliner in room,  agreeable to PT session. Pt reports ongoing soreness in R bicep, not rated and declines intervention. Sit to stand with Supervision throughout session. Stand pivot transfer to bed with no AD and CGA. Sit to/from supine on flat bed with use of bedrail at Supervision level. Pt does have discomfort in full supine position and decreased ability to breathe due to hard collar, unable to tolerate this position for extended period of time. Pt would benefit from use of a hospital bed upon d/c home for improved positioning due to cervical collar and restrictions. Ascend/descend 4 x 6" stairs with R handrail laterally with min A, x 3 reps. Ambulation through obstacle course navigating over uneven surface, weaving through cones, and stepping over objects with RW, min A needed for balance, some assist needed for RW management due to RUE pain/weakness, and cues needed for safety. Pt returned to w/c and left seated in room with needs in reach.  Session 2: Pt received seated in w/c in room, agreeable to PT session. No complaints of pain. Sit to stand with Supervision and RW throughout session. Ambulation with RW during session at close Supervision to [facility] level navigating on/off elevators and through functional environment of jewelry sale in hospital atrium. Pt does well navigating tight spaces with RW and avoiding obstacles on the floor and other patrons. She is able to side-step through booths with RW and CGA overall. Pt returns to room at end of session and left  seated in w/c with needs in reach.  Therapy Documentation Precautions:  Precautions Precautions: Fall, Cervical Required Braces or Orthoses: Cervical Brace Cervical Brace: Hard collar, At all times Restrictions Weight Bearing Restrictions: No     Therapy/Group: Individual Therapy   Excell Seltzer, PT, DPT, CSRS 02/19/2022, 12:28 PM

## 2022-02-19 NOTE — Plan of Care (Signed)
  Problem: RH Swallowing Goal: LTG Patient will consume least restrictive diet using compensatory strategies with assistance (SLP) Description: LTG:  Patient will consume least restrictive diet using compensatory strategies with assistance (SLP) Outcome: Completed/Met Goal: LTG Patient will participate in dysphagia therapy to increase swallow function with assistance (SLP) Description: LTG:  Patient will participate in dysphagia therapy to increase swallow function with assistance (SLP) Outcome: Completed/Met   

## 2022-02-19 NOTE — Progress Notes (Signed)
Occupational Therapy Session Note  Patient Details  Name: Shelby Fleming MRN: [id number] Date of Birth: 11/23/1945  Today's Date: 02/19/2022 OT Individual Time: 0930-1030 OT Individual Time Calculation (min): 60 min    Short Term Goals: Week 2:  OT Short Term Goal 1 (Week 2): STG = LTG (due to ELOS)  Skilled Therapeutic Interventions/Progress Updates:    S: Pt agreeable to wash up at sink level this AM. Requesting to take a shower tomorrow.  - Patient participated in skilled OT session while completing sink level ADL re-training. Elicited increased functional performance during UB dressing while providing VC for compensatory technique with patient requiring very minimal physical assist to maneuver shirt around cervical collar. Pt did not require any assist to bath her UB and completed activity with supplies set up at sink. Due to time constraint, patient was provided assist to donn new brief, pants, TED hose and shoes. Pt was able to demonstrate good safety awareness and balance when ambulating to bathroom using RW. Pt reports improvement with ability to reach and perform hygiene although continues to present with some difficulty with thoroughness.    P: Complete shower tomorrow AM. Bring reacher and sock aid for practice.     Therapy Documentation Precautions:  Precautions Precautions: Fall, Cervical Required Braces or Orthoses: Cervical Brace Cervical Brace: Hard collar, At all times Restrictions Weight Bearing Restrictions: No   Pain:      Therapy/Group: Individual Therapy  Ailene Ravel, OTR/L,CBIS  Supplemental OT - [facility]  02/19/2022, 8:01 AM

## 2022-02-19 NOTE — Progress Notes (Signed)
PROGRESS NOTE   Subjective/Complaints:   Sitting in chair, no new concerns. She used oxycodone for her pain yesterday with benefit.  She had a small BM yesterday.    ROS:    Pt denies SOB, abd pain, CP, N/V/C/D. No fevers or chills.   Objective:   No results found. Recent Labs    02/19/22 0501  WBC 7.0  HGB 9.1*  HCT 28.8*  PLT 381     Recent Labs    02/19/22 0501  NA 136  K 3.6  CL 97*  CO2 28  GLUCOSE 125*  BUN <5*  CREATININE 0.76  CALCIUM 8.9     Intake/Output Summary (Last 24 hours) at 02/19/2022 0918 Last data filed at 02/18/2022 1730 Gross per 24 hour  Intake 358 ml  Output --  Net 358 ml          Physical Exam: Vital Signs Blood pressure (!) 116/54, pulse 95, temperature 97.8 F (36.6 C), resp. rate 17, height '5\' 4"'$  (1.626 m), weight 87.2 kg, SpO2 95 %.      General: awake, alert, appropriate, in chair HENT: conjugate gaze; oropharynx moist- wearing cervical collar- honeycomb dressing in place CV: RRR, no MRG Pulmonary: CTA B/L GI: soft, NT, ND Psychiatric: appropriate, pleasant  Neurological: Ox3 Extremities: No clubbing, cyanosis, or edema. Pulses are 2+ Psych: Pt's affect is appropriate. Pt is cooperative Skin: Incision site is clean dry and intact does have areas of psoriasis. Note: 1) Patient does have red moist macerated area under her bilateral breasts, which is improving. She was seen by [facility] nurse with recommendations for silver impregnated fabric to be used for 5 days at a time.   2) patient also has 1-+2 edema nonpitting bilateral ankles, medial malleolus and lateral maleollus,  1+ LE edema- looks better  Has TED hose and ACE wrap in place, during day. Neuro: Patient is alert.  Oriented x3 and follows commands well Musculoskeletal: She has hard cervical collar in place, arthritis of small hand joints.  RUE 4/5, LUE 3/5, 4/5 in lower extremities  bilaterally.  Assessment/Plan: 1. Functional deficits which require 3+ hours per day of interdisciplinary therapy in a comprehensive inpatient rehab setting. Physiatrist is providing close team supervision and 24 hour management of active medical problems listed below. Physiatrist and rehab team continue to assess barriers to discharge/monitor patient progress toward functional and medical goals  Care Tool:  Bathing    Body parts bathed by patient: Right arm, Left arm, Chest, Abdomen, Front perineal area, Right upper leg, Left upper leg, Face   Body parts bathed by helper: Left lower leg, Right lower leg, Buttocks     Bathing assist Assist Level: Moderate Assistance - Patient 50 - 74%     Upper Body Dressing/Undressing Upper body dressing   What is the patient wearing?: Bra, Pull over shirt, Orthosis (cervical collar in place) Orthosis activity level: Performed by helper  Upper body assist Assist Level: Moderate Assistance - Patient 50 - 74%    Lower Body Dressing/Undressing Lower body dressing      What is the patient wearing?: Underwear/pull up, Pants     Lower body assist Assist for lower body dressing:  Moderate Assistance - Patient 50 - 74%     Toileting Toileting    Toileting assist Assist for toileting: Maximal Assistance - Patient 25 - 49%     Transfers Chair/bed transfer  Transfers assist     Chair/bed transfer assist level: Supervision/Verbal cueing     Locomotion Ambulation   Ambulation assist      Assist level: Supervision/Verbal cueing Assistive device: Walker-rolling Max distance: 150'   Walk 10 feet activity   Assist     Assist level: Supervision/Verbal cueing Assistive device: Walker-rolling   Walk 50 feet activity   Assist Walk 50 feet with 2 turns activity did not occur: Safety/medical concerns  Assist level: Supervision/Verbal cueing Assistive device: Walker-rolling    Walk 150 feet activity   Assist Walk 150 feet  activity did not occur: Safety/medical concerns  Assist level: Supervision/Verbal cueing Assistive device: Walker-rolling    Walk 10 feet on uneven surface  activity   Assist Walk 10 feet on uneven surfaces activity did not occur: Safety/medical concerns         Wheelchair     Assist Is the patient using a wheelchair?: Yes Type of Wheelchair: Manual    Wheelchair assist level: Dependent - Patient 0%      Wheelchair 50 feet with 2 turns activity    Assist        Assist Level: Dependent - Patient 0%   Wheelchair 150 feet activity     Assist      Assist Level: Dependent - Patient 0%   Blood pressure (!) 116/54, pulse 95, temperature 97.8 F (36.6 C), resp. rate 17, height '5\' 4"'$  (1.626 m), weight 87.2 kg, SpO2 95 %.    Medical Problem List and Plan: 1. 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  Cervical collar as directed             -patient may shower             -ELOS/Goals: 5-7 days S             Con't CIR- PT and OT  -She would likely benefit from hospital bed  -Discharge date expected 6/21, min A to Mod I 2.  Antithrombotics: -DVT/anticoagulation:  Pharmaceutical: Lovenox check vascular study  6/13- has superficial Venous thrombosis very low down- will not treat- per CHEST guidelines             -antiplatelet therapy: N/A 3. Postoperative pain: continue Tramadol 50 mg every 6 hours, Neurontin 100 mg twice daily, Flexeril 10 mg 3 times daily as needed, oxycodone as needed             6/8- pain controlled with scheduled tramadol  6/12- pain controlled, but still bothersome in R upper arm still  6/15 RUE pain controlled with oxycodone 4. Mood: Provide emotional support             -antipsychotic agents: N/A 5. Neuropsych: This patient is capable of making  decisions on her own behalf. 6. Skin/Wound Care: Routine skin checks 7. Fluids/Electrolytes/Nutrition: Routine in and outs with follow-up chemistries 8.  Neurogenic bowel and bladder.  Check PVR.  Patient had initially refused intermittent catheterization.  Establish bowel program             6/8- will order bladder scans to see if retaining still- not clear if needs bowel program quite yet.  6/9- is voiding- will con't to monitor to see if needs caths 6/11 Patient is voiding okay without needing caths  6/12- LBM yesterday small- bladder scans <100cc- con'to monitor  6/15 BM yesterday, bladder scans 0-12m 9.  Dysphagia.  Improved.  Diet advanced to regular 10.  Hypertension.  Patient on lisinopril 40 mg daily prior to admission.  Resume as needed             6/8- BP well controlled off Lisinopril  6/15 continue to hold lisinopril    02/19/2022    2:56 AM 02/18/2022    7:36 PM 02/18/2022    2:43 PM  Vitals with BMI  Systolic [id number] Diastolic 54 55 52  Pulse 95 93 92    11.  Hyperlipidemia.  Restart Lovaza 2 g twice daily 12.  Psoriasis.  Restart Oteleza 30 mg twice daily             6/8- will send Otezla and cream to pharmacy so can use.  6/11 patient to continue Oteleza 30 mg BID 13. 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 Hypokalemia             6/8- K+ 2.9- will give KCL 40 mEq x3 and recheck in AM- was last 3.7             6/9- K+ 4.2 this AM- will recheck Monday            6/12- K+ 3.5- will put on low daily dose of KCL 10 mEq daily and recheck Thursday  6/15- K+ 3.6 today, continue low dose K+ 15. MASD under breasts             6/9- will consult WOC for recommendations- is severe. With open skin 16. Constipation             6/9- Will order sorbitol after therapy.  6/10 Patient's last BM was today sorbitol worked well.  6/12- LBM yesterday  6/15 BM yesterday 17. Moderate malnutrition             6/9- will add Ensure BID between meals 18. Dry mouth              6/9- doesn't have thrush on exam- will order Biotene mouthwash   6/13- Mouth less dry with Biotene 19.  Bilateral lower ankle edema 6/10 patient says that she has had edema that started on Thursday.  She says that it is mostly in the ankles wonders if it could be due to her psoriatic arthritis.  Some of the swelling on the anterior right ankle has subsided overall there is greater edema in the left ankle versus right there is no edema of the legs and +1 nonpitting edema the dorsal surface of both feet.  Edema is +2 nonpitting for the ankles.  Encourage use of elevation for suspected dependent edema if this does not work may also add on use of Ace wrap. 6/11 PT placed TED hose (ankle high) on pt then placed ACE wrap over. Swelling of lower legs and ankles less today than yesterday.  Continue elevation of legs with pillows.   6/12- LE edema looking better- con't regimen     LOS: 8 days A FACE TO FACE EVALUATION WAS PERFORMED  YJennye Boroughs6/15/2023, 9:18 AM

## 2022-02-20 DIAGNOSIS — R600 Localized edema: Secondary | ICD-10-CM

## 2022-02-20 DIAGNOSIS — N319 Neuromuscular dysfunction of bladder, unspecified: Secondary | ICD-10-CM

## 2022-02-20 MED ORDER — SORBITOL 70 % SOLN: 45.0000 mL | Freq: Every day | Status: DC | PRN

## 2022-02-20 NOTE — Progress Notes (Signed)
Occupational Therapy Session Note  Patient Details  Name: Shelby Fleming MRN: [id number] Date of Birth: 12/17/1945  Today's Date: 02/20/2022 OT Individual Time: 1130-1200 OT Individual Time Calculation (min): 30 min    Short Term Goals: Week 2:  OT Short Term Goal 1 (Week 2): STG = LTG (due to ELOS)  Skilled Therapeutic Interventions/Progress Updates:     Upon OT arrival, pt ambulating with nurse tech from the bathroom with Supervision and use of RW. Pt completes stand to sit transfer into w/c with Supervision. Pt agreeable to OT session and reports no pain. Pt completes sit to stand transfer with Supervision and ambulates to the sink with Supervision using RW and completes hand hygiene with Supervision. No LOB noted. Pt able to reach outside of her BOS demonstrating good control. Pt ambulates back to the w/c with Supervision with RW and completes stand to sit transfer with Supervision. Pt was transported to therapy gym via w/c for time management. Pt completes sit to stand transfer with Supervision and stands at tabletop to improve standing tolerance and dynamic standing balance. Pt tolerates standing at tabletop for ~15 minutes to create custom design using parquetry tiles with B UE. Pt able to stand without UE support on tabletop to manage tiles and also put them away without UE support. Pt also noted to not rest LE against w/c. Pt requires no seated rest breaks.  Pt making progress towards stated OT goals and continues to benefit from OT services to achieve highest level of function. Pt was returned to her room via w/c with Total A and left in w/c with all needs met.   Therapy Documentation Precautions:  Precautions Precautions: Fall, Cervical Required Braces or Orthoses: Cervical Brace Cervical Brace: Hard collar, At all times Restrictions Weight Bearing Restrictions: No   ADL: Grooming: Supervision/safety Where Assessed-Grooming: Sitting at sink   Therapy/Group: Individual  Therapy  [name] 02/20/2022, 12:30 PM

## 2022-02-20 NOTE — Progress Notes (Signed)
PROGRESS NOTE   Subjective/Complaints:   Sitting in chair. She says she had "a really good night". Does not feel constipated but has not had a good BM in a couple days.    ROS:    Pt denies fever, chills, SOB, abd pain, CP, N/V/C/D. No HA  Objective:   No results found. Recent Labs    02/19/22 0501  WBC 7.0  HGB 9.1*  HCT 28.8*  PLT 381     Recent Labs    02/19/22 0501  NA 136  K 3.6  CL 97*  CO2 28  GLUCOSE 125*  BUN <5*  CREATININE 0.76  CALCIUM 8.9     Intake/Output Summary (Last 24 hours) at 02/20/2022 0913 Last data filed at 02/19/2022 1758 Gross per 24 hour  Intake 420 ml  Output --  Net 420 ml          Physical Exam: Vital Signs Blood pressure (!) 123/50, pulse 90, temperature 98.2 F (36.8 C), resp. rate 18, height '5\' 4"'$  (1.626 m), weight 87.2 kg, SpO2 91 %.      General: awake, alert, appropriate, in chair HENT: conjugate gaze; oropharynx moist- wearing cervical collar- honeycomb dressing in place CV: RRR, no MRG Pulmonary: CTA B/L GI: soft, NT, ND, BS positive Psychiatric: appropriate, pleasant  Neurological: Ox3 Extremities: No clubbing, cyanosis, or edema. Pulses are 2+ Psych: very pleasant Skin: Incision site is clean dry and intact does have areas of psoriasis. Warm and dry otherwise Note: 1) Patient does have red moist macerated area under her bilateral breasts, which is improving. She was seen by [facility] nurse with recommendations for silver impregnated fabric to be used for 5 days at a time.   2) patient also has edema nonpitting bilateral ankles, medial malleolus and lateral maleollus,  1+ LE edema- looks better  Has TED hose and ACE wrap in place, during day. Neuro: Patient is alert.  Oriented x3 and follows commands well Musculoskeletal: She has hard cervical collar in place, arthritis of small hand joints.  RUE 4/5, LUE 3/5, 4/5 in lower extremities  bilaterally.  Assessment/Plan: 1. Functional deficits which require 3+ hours per day of interdisciplinary therapy in a comprehensive inpatient rehab setting. Physiatrist is providing close team supervision and 24 hour management of active medical problems listed below. Physiatrist and rehab team continue to assess barriers to discharge/monitor patient progress toward functional and medical goals  Care Tool:  Bathing    Body parts bathed by patient: Right arm, Left arm, Chest, Abdomen, Front perineal area, Right upper leg, Left upper leg, Face   Body parts bathed by helper: Left lower leg, Right lower leg, Buttocks     Bathing assist Assist Level: Moderate Assistance - Patient 50 - 74%     Upper Body Dressing/Undressing Upper body dressing   What is the patient wearing?: Bra, Pull over shirt, Orthosis (cervical collar in place) Orthosis activity level: Performed by helper  Upper body assist Assist Level: Moderate Assistance - Patient 50 - 74%    Lower Body Dressing/Undressing Lower body dressing      What is the patient wearing?: Underwear/pull up, Pants     Lower body assist Assist for  lower body dressing: Moderate Assistance - Patient 50 - 74%     Toileting Toileting    Toileting assist Assist for toileting: Maximal Assistance - Patient 25 - 49%     Transfers Chair/bed transfer  Transfers assist     Chair/bed transfer assist level: Supervision/Verbal cueing     Locomotion Ambulation   Ambulation assist      Assist level: Supervision/Verbal cueing Assistive device: Walker-rolling Max distance: 250   Walk 10 feet activity   Assist     Assist level: Supervision/Verbal cueing Assistive device: Walker-rolling   Walk 50 feet activity   Assist Walk 50 feet with 2 turns activity did not occur: Safety/medical concerns  Assist level: Supervision/Verbal cueing Assistive device: Walker-rolling    Walk 150 feet activity   Assist Walk 150 feet  activity did not occur: Safety/medical concerns  Assist level: Supervision/Verbal cueing Assistive device: Walker-rolling    Walk 10 feet on uneven surface  activity   Assist Walk 10 feet on uneven surfaces activity did not occur: Safety/medical concerns         Wheelchair     Assist Is the patient using a wheelchair?: Yes Type of Wheelchair: Manual    Wheelchair assist level: Dependent - Patient 0%      Wheelchair 50 feet with 2 turns activity    Assist        Assist Level: Dependent - Patient 0%   Wheelchair 150 feet activity     Assist      Assist Level: Dependent - Patient 0%   Blood pressure (!) 123/50, pulse 90, temperature 98.2 F (36.8 C), resp. rate 18, height '5\' 4"'$  (1.626 m), weight 87.2 kg, SpO2 91 %.    Medical Problem List and Plan: 1. 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  Cervical collar as directed             -patient may shower             -ELOS/Goals: 5-7 days S             -Con't CIR- PT and OT  -She would likely benefit from hospital bed  -Discharge date expected 6/21, min A to Mod I  -Walking 348f with RW 2.  Antithrombotics: -DVT/anticoagulation:  Pharmaceutical: Lovenox check vascular study  6/13- has superficial Venous thrombosis very low down- will not treat- per CHEST guidelines             -antiplatelet therapy: N/A 3. Postoperative pain: continue Tramadol 50 mg every 6 hours, Neurontin 100 mg twice daily, Flexeril 10 mg 3 times daily as needed, oxycodone as needed             6/8- pain controlled with scheduled tramadol  6/12- pain controlled, but still bothersome in R upper arm still  6/15 RUE pain controlled with oxycodone 4. Mood: Provide emotional support             -antipsychotic agents: N/A 5. Neuropsych: This  patient is capable of making decisions on her own behalf. 6. Skin/Wound Care: Routine skin checks 7. Fluids/Electrolytes/Nutrition: Routine in and outs with follow-up chemistries 8.  Neurogenic bowel and bladder.  Check PVR.  Patient had initially refused intermittent catheterization.  Establish bowel program             6/8- will order bladder scans to see if retaining still-  not clear if needs bowel program quite yet.              6/9- is voiding- will con't to monitor to see if needs caths 6/11 Patient is voiding okay without needing caths  6/12- LBM yesterday small- bladder scans <100cc- con'to monitor  6/15 BM yesterday, bladder scans 0-129m  6/16 further bladder scan yesterday <100  6/16 PRN dulcolax available if not BM, Can retry sorbitol 9.  Dysphagia.  Improved.  Diet advanced to regular 10.  Hypertension.  Patient on lisinopril 40 mg daily prior to admission.  Resume as needed             6/8- BP well controlled off Lisinopril  6/15 continue to hold lisinopril    02/20/2022    5:42 AM 02/19/2022    7:50 PM 02/19/2022    2:32 PM  Vitals with BMI  Systolic [id number] Diastolic 50 51 65  Pulse 90 93 87    11.  Hyperlipidemia.  Restart Lovaza 2 g twice daily 12.  Psoriasis.  Restart Oteleza 30 mg twice daily             6/8- will send Otezla and cream to pharmacy so can use.  6/11 patient to continue Oteleza 30 mg BID 13. 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 Hypokalemia             6/8- K+ 2.9- will give KCL 40 mEq x3 and recheck in AM- was last 3.7             6/9- K+ 4.2 this AM- will recheck Monday            6/12- K+ 3.5- will put on low daily dose of KCL 10 mEq daily and recheck Thursday  6/15- K+ 3.6 today, continue low dose K+ 15. MASD under breasts             6/9- will consult WOC for recommendations- is severe. With open skin 16. Constipation             6/9- Will order sorbitol after therapy.  6/10 Patient's last BM was today sorbitol worked  well.  6/12- LBM yesterday  6/15 BM yesterday   17. Moderate malnutrition             6/9- will add Ensure BID between meals 18. Dry mouth             6/9- doesn't have thrush on exam- will order Biotene mouthwash   6/13- Mouth less dry with Biotene 19.  Bilateral lower ankle edema 6/10 patient says that she has had edema that started on Thursday.  She says that it is mostly in the ankles wonders if it could be due to her psoriatic arthritis.  Some of the swelling on the anterior right ankle has subsided overall there is greater edema in the left ankle versus right there is no edema of the legs and +1 nonpitting edema the dorsal surface of both feet.  Edema is +2 nonpitting for the ankles.  Encourage use of elevation for suspected dependent edema if this does not work may also add on use of Ace wrap. 6/11 PT placed TED hose (ankle high) on pt then placed ACE wrap over. Swelling of lower legs and ankles less today than yesterday.  Continue elevation of legs with pillows.   6/12- LE edema looking better- con't regimen  6/16 LE edema appears a little improved  LOS: 9 days A FACE TO FACE EVALUATION WAS PERFORMED  Jennye Boroughs 02/20/2022, 9:13 AM

## 2022-02-20 NOTE — Progress Notes (Signed)
Occupational Therapy Session Note  Patient Details  Name: Shelby Fleming MRN: [id number] Date of Birth: [birth year]  Today's Date: 02/20/2022 OT Individual Time: 1445-1530 OT Individual Time Calculation (min): 45 min    Short Term Goals: Week 2:  OT Short Term Goal 1 (Week 2): STG = LTG (due to ELOS)  Skilled Therapeutic Interventions/Progress Updates:     Upon OT arrival, pt seated in recliner and was agreeable to OT session. No pain reported. Treatment session focused on dynamic standing balance during IADLs and ROM. Pt completes sit to stand transfer with Mod I and ambulates to rehab apartment with RW and Supervision. Pt opened pantry and retrieved one item at a time and transported to table located to pt's left. Pt able to complete with Supervision using RW 8 times without LOB and ability to complete without UE support as needed. Pt completes stand to sit transfer with Mod I and was provided with seated rest break. Pt then completes sit to stand transfer with RW and Mod I and ambulates to kitchen counter to sort utensils/dishes into drawers/cabinets and retrieve pan from oven. Pt completes with Supervision and tolerates well. No LOB observed. Pt states "That really helped me get excited about the kitchen when I leave". Pt ambulates back to her room with Supervision and returns to her recliner with Mod I. While seated in recliner, pt completes AAROM shoulder flexion for 1/10 reps, and AROM shoulder elevation/depression, shoulder rolls forwards, and backwards for 1 x 10 reps. Pt very appreciative of the exercises and reports she will do them during her time off from therapy. Pt making progress towards stated OT goals and continues to benefit from OT services to achieve highest level of independence. Pt left in recliner with all needs met.  Therapy Documentation Precautions:  Precautions Precautions: Fall, Cervical Required Braces or Orthoses: Cervical Brace Cervical Brace: Hard collar, At all  times Restrictions Weight Bearing Restrictions: No   Therapy/Group: Individual Therapy  [name] 02/20/2022, 5:01 PM

## 2022-02-20 NOTE — Progress Notes (Signed)
Physical Therapy Session Note  Patient Details  Name: Shelby Fleming MRN: [id number] Date of Birth: Jan 30, 1946  Today's Date: 02/20/2022 PT Individual Time: 0803-0900 PT Individual Time Calculation (min): 57 min   Short Term Goals: Week 1:  PT Short Term Goal 1 (Week 1): Pt will transfer sup to sit w/ log roll w/ min A. PT Short Term Goal 1 - Progress (Week 1): Met PT Short Term Goal 2 (Week 1): Pt will transfer sit to stand w/ CGA. PT Short Term Goal 2 - Progress (Week 1): Met PT Short Term Goal 3 (Week 1): Pt will amb 100' w/ RW and CGA. PT Short Term Goal 3 - Progress (Week 1): Met PT Short Term Goal 4 (Week 1): Address stairs PT Short Term Goal 4 - Progress (Week 1): Met  Skilled Therapeutic Interventions/Progress Updates: Pt presents sitting in w/c w/ B legs elevated on leg rests.  Pt eager for therapy.  Nursing in room giving meds.  Pt transfers sit to stand w/ supervision throughout session from w/c or arm chair.  Pt amb > 150' w/ RW and supervision across changing surfaces from carpet<> hard flooring.  Pt performed standing abd 3 x 10, static standing on Airex cushion, partial squats on cushion 3 x 15.  Pt amb >200' and >250' w/ RW and supervision, verbal cues given for decreased sway.  Pt returned to room and remained sitting in w/c w/ leg rests elevated and all needs in reach.      Therapy Documentation Precautions:  Precautions Precautions: Fall, Cervical Required Braces or Orthoses: Cervical Brace Cervical Brace: Hard collar, At all times Restrictions Weight Bearing Restrictions: No General:   Vital Signs: Therapy Vitals Temp: 98.2 F (36.8 C) Pulse Rate: 90 Resp: 18 BP: (!) 123/50 Patient Position (if appropriate): Sitting Oxygen Therapy SpO2: 91 % O2 Device: Room Air Pain:soreness to touch R upper arm,  Pain Assessment Pain Scale: 0-10 Pain Score: 4  Pain Type: Surgical pain Pain Location: Throat Pain Orientation: Anterior Pain Descriptors / Indicators:  Aching;Burning Pain Frequency: Intermittent Pain Onset: On-going Patients Stated Pain Goal: Other (Comment) Pain Intervention(s): Rest Multiple Pain Sites: No      Therapy/Group: Individual Therapy  Ladoris Gene 02/20/2022, 9:05 AM

## 2022-02-20 NOTE — Progress Notes (Signed)
Occupational Therapy Session Note  Patient Details  Name: Gearlene Godsil MRN: [id number] Date of Birth: 25-Feb-1946  Today's Date: 02/20/2022 OT Individual Time: 1300-1415 OT Individual Time Calculation (min): 75 min    Short Term Goals: Week 2:  OT Short Term Goal 1 (Week 2): STG = LTG (due to ELOS)  Skilled Therapeutic Interventions/Progress Updates:    S: Patient up in w/c upon therapy arrival and ready to take shower.    Patient participated in skilled OT session completing ADL tasks including toileting, functional transfers, safe use of RW while navigating within her environment, bathing while seated in shower and dressing utilizing adaptive equipment to increase independence and functional performance. Tub bench, grab bars, and long handled sponge was utilized while bathing to decrease difficulty level and allow patient to reach needed body parts. Patient completed all functional transfers (toilet, shower) ambulating with RW with SBA. VC were provided for technique. Patient completed UB bathing and LB bathing with distant supervision and requested assistance when needed demonstrating good safety awareness and judgement. Dressing completed seated in w/c with patient requiring min assist only to manage shirt and bra around cervical collar. LB dressing was completed at max assist due to time constraint.    Therapy Documentation Precautions:  Precautions Precautions: Fall, Cervical Required Braces or Orthoses: Cervical Brace Cervical Brace: Hard collar, At all times Restrictions Weight Bearing Restrictions: No  Pain:  0/10 pain reported.    Therapy/Group: Individual Therapy  Ailene Ravel, OTR/L,CBIS  Supplemental OT - [facility]  02/20/2022, 1:53 PM

## 2022-02-21 NOTE — Progress Notes (Signed)
PROGRESS NOTE   Subjective/Complaints:   Reports no new concerns this AM.    ROS:    Pt denies fever, chills, SOB, abd pain, CP, N/V/C/D. No cough  Objective:   No results found. Recent Labs    02/19/22 0501  WBC 7.0  HGB 9.1*  HCT 28.8*  PLT 381     Recent Labs    02/19/22 0501  NA 136  K 3.6  CL 97*  CO2 28  GLUCOSE 125*  BUN <5*  CREATININE 0.76  CALCIUM 8.9     Intake/Output Summary (Last 24 hours) at 02/21/2022 0954 Last data filed at 02/21/2022 0838 Gross per 24 hour  Intake 240 ml  Output --  Net 240 ml          Physical Exam: Vital Signs Blood pressure (!) 114/55, pulse 92, temperature 98.5 F (36.9 C), temperature source Oral, resp. rate 18, height '5\' 4"'$  (1.626 m), weight 87.2 kg, SpO2 92 %.      General: awake, alert, appropriate, in chair HENT: conjugate gaze; oropharynx moist- wearing cervical collar- honeycomb dressing in place CV: RRR Pulmonary: CTA B/L, no RRW GI: soft, NT, ND, BS positive Psychiatric: appropriate, pleasant  Neurological: Ox3 Extremities: No clubbing, cyanosis, or edema. Pulses are 2+ Psych: very pleasant Skin: Incision site is clean dry and intact does have areas of psoriasis. Warm and dry otherwise Note: 1) Patient does have red moist macerated area under her bilateral breasts, which is improving. She was seen by [facility] nurse with recommendations for silver impregnated fabric to be used for 5 days at a time.   2) patient also has edema nonpitting bilateral ankles, medial malleolus and lateral maleollus,  1+ LE edema- looks better  Has TED hose and ACE wrap in place, during day. Neuro: Patient is alert.  Oriented x3 and follows commands well Musculoskeletal: She has hard cervical collar in place, arthritis of small hand joints.  RUE 4/5, LUE 3/5, 4/5 in lower extremities bilaterally.  Assessment/Plan: 1. Functional deficits which require 3+ hours per day  of interdisciplinary therapy in a comprehensive inpatient rehab setting. Physiatrist is providing close team supervision and 24 hour management of active medical problems listed below. Physiatrist and rehab team continue to assess barriers to discharge/monitor patient progress toward functional and medical goals  Care Tool:  Bathing    Body parts bathed by patient: Right arm, Left arm, Chest, Abdomen, Front perineal area, Right upper leg, Left upper leg, Face   Body parts bathed by helper: Left lower leg, Right lower leg, Buttocks     Bathing assist Assist Level: Moderate Assistance - Patient 50 - 74%     Upper Body Dressing/Undressing Upper body dressing   What is the patient wearing?: Bra, Pull over shirt, Orthosis (cervical collar in place) Orthosis activity level: Performed by helper  Upper body assist Assist Level: Moderate Assistance - Patient 50 - 74%    Lower Body Dressing/Undressing Lower body dressing      What is the patient wearing?: Underwear/pull up, Pants     Lower body assist Assist for lower body dressing: Moderate Assistance - Patient 50 - 74%     Toileting Toileting  Toileting assist Assist for toileting: Maximal Assistance - Patient 25 - 49%     Transfers Chair/bed transfer  Transfers assist     Chair/bed transfer assist level: Supervision/Verbal cueing     Locomotion Ambulation   Ambulation assist      Assist level: Supervision/Verbal cueing Assistive device: Walker-rolling Max distance: 250   Walk 10 feet activity   Assist     Assist level: Supervision/Verbal cueing Assistive device: Walker-rolling   Walk 50 feet activity   Assist Walk 50 feet with 2 turns activity did not occur: Safety/medical concerns  Assist level: Supervision/Verbal cueing Assistive device: Walker-rolling    Walk 150 feet activity   Assist Walk 150 feet activity did not occur: Safety/medical concerns  Assist level: Supervision/Verbal  cueing Assistive device: Walker-rolling    Walk 10 feet on uneven surface  activity   Assist Walk 10 feet on uneven surfaces activity did not occur: Safety/medical concerns         Wheelchair     Assist Is the patient using a wheelchair?: Yes Type of Wheelchair: Manual    Wheelchair assist level: Dependent - Patient 0%      Wheelchair 50 feet with 2 turns activity    Assist        Assist Level: Dependent - Patient 0%   Wheelchair 150 feet activity     Assist      Assist Level: Dependent - Patient 0%   Blood pressure (!) 114/55, pulse 92, temperature 98.5 F (36.9 C), temperature source Oral, resp. rate 18, height '5\' 4"'$  (1.626 m), weight 87.2 kg, SpO2 92 %.    Medical Problem List and Plan: 1. 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  Cervical collar as directed             -patient may shower             -ELOS/Goals: 5-7 days S             -Con't CIR- PT and OT  -She would likely benefit from hospital bed  -Discharge date expected 6/21, min A to Mod I  -UB bathing and LB bathing completed with distant supervision 2.  Antithrombotics: -DVT/anticoagulation:  Pharmaceutical: Lovenox check vascular study  6/13- has superficial Venous thrombosis very low down- will not treat- per CHEST guidelines             -antiplatelet therapy: N/A 3. Postoperative pain: continue Tramadol 50 mg every 6 hours, Neurontin 100 mg twice daily, Flexeril 10 mg 3 times daily as needed, oxycodone as needed             6/8- pain controlled with scheduled tramadol  6/12- pain controlled, but still bothersome in R upper arm still  6/15 RUE pain controlled with oxycodone 4. Mood: Provide emotional support             -antipsychotic agents: N/A 5. Neuropsych: This patient is capable of  making decisions on her own behalf. 6. Skin/Wound Care: Routine skin checks 7. Fluids/Electrolytes/Nutrition: Routine in and outs with follow-up chemistries 8.  Neurogenic bowel and bladder.  Check PVR.  Patient had initially refused intermittent catheterization.  Establish bowel program             6/8- will order bladder scans to see if retaining still- not clear if needs bowel program quite yet.  6/9- is voiding- will con't to monitor to see if needs caths 6/11 Patient is voiding okay without needing caths  6/12- LBM yesterday small- bladder scans <100cc- con'to monitor  6/15 BM yesterday, bladder scans 0-132m  6/16 further bladder scan yesterday <100  6/17 Voiding okay, BM today 9.  Dysphagia.  Improved.  Diet advanced to regular 10.  Hypertension.  Patient on lisinopril 40 mg daily prior to admission.  Resume as needed             6/8- BP well controlled off Lisinopril  6/17 BP controlled, continue to follow    02/21/2022    5:15 AM 02/20/2022    7:53 PM 02/20/2022    2:41 PM  Vitals with BMI  Systolic [id number] Diastolic 55 61 54  Pulse 92 94 95    11.  Hyperlipidemia.  Restart Lovaza 2 g twice daily 12.  Psoriasis.  Restart Oteleza 30 mg twice daily             6/8- will send Otezla and cream to pharmacy so can use.  6/11 patient to continue Oteleza 30 mg BID 13. 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 Hypokalemia             6/8- K+ 2.9- will give KCL 40 mEq x3 and recheck in AM- was last 3.7             6/9- K+ 4.2 this AM- will recheck Monday            6/12- K+ 3.5- will put on low daily dose of KCL 10 mEq daily and recheck Thursday  6/15- K+ 3.6 today, continue low dose K+ 15. MASD under breasts             6/9- will consult [facility] for recommendations- is severe. With open skin 16. Constipation             6/9- Will order sorbitol after therapy.  6/10 Patient's last BM was today sorbitol worked well.  6/12- LBM yesterday  6/15 BM yesterday  6/17 BM  today 17. Moderate malnutrition             6/9- will add Ensure BID between meals 18. Dry mouth             6/9- doesn't have thrush on exam- will order Biotene mouthwash   6/13- Mouth less dry with Biotene 19.  Bilateral lower ankle edema 6/10 patient says that she has had edema that started on Thursday.  She says that it is mostly in the ankles wonders if it could be due to her psoriatic arthritis.  Some of the swelling on the anterior right ankle has subsided overall there is greater edema in the left ankle versus right there is no edema of the legs and +1 nonpitting edema the dorsal surface of both feet.  Edema is +2 nonpitting for the ankles.  Encourage use of elevation for suspected dependent edema if this does not work may also add on use of Ace wrap. 6/11 PT placed TED hose (ankle high) on pt then placed ACE wrap over. Swelling of lower legs and ankles less today than yesterday.  Continue elevation of legs with pillows.   6/12- LE edema looking better- con't regimen  6/16 LE edema appears a little improved     LOS: 10 days A FACE TO FACE EVALUATION WAS PERFORMED  YJennye Boroughs6/17/2023, 9:54 AM

## 2022-02-21 NOTE — Progress Notes (Signed)
Physical Therapy Session Note  Patient Details  Name: Shelby Fleming MRN: [id number] Date of Birth: 1946/06/26  Today's Date: 02/21/2022 PT Individual Time: 0165-5374 PT Individual Time Calculation (min): 73 min   Short Term Goals: Week 1:  PT Short Term Goal 1 (Week 1): Pt will transfer sup to sit w/ log roll w/ min A. PT Short Term Goal 1 - Progress (Week 1): Met PT Short Term Goal 2 (Week 1): Pt will transfer sit to stand w/ CGA. PT Short Term Goal 2 - Progress (Week 1): Met PT Short Term Goal 3 (Week 1): Pt will amb 100' w/ RW and CGA. PT Short Term Goal 3 - Progress (Week 1): Met PT Short Term Goal 4 (Week 1): Address stairs PT Short Term Goal 4 - Progress (Week 1): Met Week 2:  PT Short Term Goal 1 (Week 2): =LTG due to ELOS  Skilled Therapeutic Interventions/Progress Updates:   Received pt sitting in WC, pt agreeable to PT treatment, and denied any pain but reported "soreness" along R deltoid. Session with emphasis on functional mobility/transfers, generalized strengthening and endurance, dynamic standing balance/coordination, NMR, stair navigation, and gait training. Donned ted hose and shoes with max A and pt stood with RW and supervision and ambulated 139f x 2 trials with RW and CGA/close supervision to/from main therapy gym. Pt navigated 8 steps with R handrail and CGA/min A using a lateral stepping technique. Pt then performed the following activities on Biodex with emphasis on dynamic standing balance: -weight shifting training on level static with BUE support fading to 1 UE support for 1.5 minutes with close supervision -weight shifting training on level 6 with BUE support for 1.5 minutes with CGA -limits of stability training on level static with BUE support for 2.5 minutes with close supervision -limits of stability training on level 5 with BUE support for 2 minutes and 15 seconds with CGA/light min A Pt ambulated additional 269fwith RW and supervision to mat and worked on  dynamic standing balance performing toe taps to 6in step without UE support 2x10 reps with min A for balance. Pt c/o discomfort and heaviness in shoulders from cervical collar - reviewed technique for scapula squeezes and posterior shoulder roll exercises. Sit<>stand without AD and CGA and pt ambulated [id number] AD and CGA/light min A - cues to widen BOS, for upright posture/gaze, and to increase step length. Concluded session with pt sitting in WC with BLEs elevated and all needs within reach.   Therapy Documentation Precautions:  Precautions Precautions: Fall, Cervical Required Braces or Orthoses: Cervical Brace Cervical Brace: Hard collar, At all times Restrictions Weight Bearing Restrictions: No  Therapy/Group: Individual Therapy AnnAlfonse Alpers, DPT  02/21/2022, 6:58 AM

## 2022-02-22 MED ORDER — OXYCODONE HCL 5 MG PO TABS: 10.0000 mg | ORAL_TABLET | ORAL | Status: DC | PRN

## 2022-02-22 NOTE — Discharge Summary (Incomplete)
Physician Discharge Summary  Patient ID: [name] MRN: [id number] DOB/AGE: 10-31-1945 76 y.o.  Admit date: 02/11/2022 Discharge date: 02/25/2022  Discharge Diagnoses:  Principal Problem:   Central cord syndrome [facility]) Active Problems:   Pressure injury of skin DVT prophylaxis/superficial venous thrombosis involving the right small saphenous vein Neurogenic bowel and bladder Hypertension Hyperlipidemia Psoriasis Constipation  Discharged Condition: Stable  Significant Diagnostic Studies: VAS [place] LOWER EXTREMITY VENOUS (DVT)  Result Date: 02/16/2022  Lower Venous DVT Study Patient Name:  Shelby Fleming  Date of Exam:   02/16/2022 Medical Rec #: [id number]    Accession #:    [id number] Date of Birth: May 15, 1946     Patient Gender: F Patient Age:   76 years Exam Location:  [facility] Procedure:      VAS [place] LOWER EXTREMITY VENOUS (DVT) Referring Phys: Lauraine Rinne --------------------------------------------------------------------------------  Indications: Swelling.  Comparison Study: 02/12/22, positive acute right superficial thrombophlebitis of                   the small saphenous vein. Performing Technologist: Bobetta Lime  Examination Guidelines: A complete evaluation includes B-mode imaging, spectral Doppler, color Doppler, and power Doppler as needed of all accessible portions of each vessel. Bilateral testing is considered an integral part of a complete examination. Limited examinations for reoccurring indications may be performed as noted. The reflux portion of the exam is performed with the patient in reverse Trendelenburg.  +---------+---------------+---------+-----------+------------+----------------+ RIGHT    CompressibilityPhasicitySpontaneityProperties  Thrombus Aging   +---------+---------------+---------+-----------+------------+----------------+ CFV      Full           Yes      Yes                                      +---------+---------------+---------+-----------+------------+----------------+ SFJ      Full                                                            +---------+---------------+---------+-----------+------------+----------------+ FV Prox  Full                                                            +---------+---------------+---------+-----------+------------+----------------+ FV Mid   Full                                                            +---------+---------------+---------+-----------+------------+----------------+ FV DistalFull                                                            +---------+---------------+---------+-----------+------------+----------------+ PFV      Full                                                            +---------+---------------+---------+-----------+------------+----------------+  POP      Full           Yes      Yes                                     +---------+---------------+---------+-----------+------------+----------------+ PTV      Full                                                            +---------+---------------+---------+-----------+------------+----------------+ PERO     Full                                                            +---------+---------------+---------+-----------+------------+----------------+ SSV      None                               with        Age                                                          striations  Indeterminate    +---------+---------------+---------+-----------+------------+----------------+ No progression noted of the thrombus in the small saphenous vein.  +----+---------------+---------+-----------+----------+--------------+ LEFTCompressibilityPhasicitySpontaneityPropertiesThrombus Aging +----+---------------+---------+-----------+----------+--------------+ CFV Full           Yes      Yes                                  +----+---------------+---------+-----------+----------+--------------+    Summary: RIGHT: - Findings consistent with age indeterminate superficial vein thrombosis involving the right small saphenous vein. - Findings appear essentially unchanged compared to previous examination. - There is no evidence of deep vein thrombosis in the lower extremity.  - No cystic structure found in the popliteal fossa.  LEFT: - No evidence of common femoral vein obstruction. - No cystic structure found in the popliteal fossa.  *See table(s) above for measurements and observations. Electronically signed by Servando Snare MD on 02/16/2022 at 6:03:15 PM.    Final    VAS [place] LOWER EXTREMITY VENOUS (DVT)  Result Date: 02/12/2022  Lower Venous DVT Study Patient Name:  Shelby Fleming  Date of Exam:   02/12/2022 Medical Rec #: [id number]    Accession #:    [id number] Date of Birth: July 13, 1946     Patient Gender: F Patient Age:   76 years Exam Location:  [facility] Procedure:      VAS [place] LOWER EXTREMITY VENOUS (DVT) Referring Phys: Lauraine Rinne --------------------------------------------------------------------------------  Indications: Swelling.  Risk Factors: None identified. Limitations: Poor ultrasound/tissue interface and patient positioning. Comparison Study: No prior studies. Performing Technologist: Oliver Hum RVT  Examination Guidelines: A complete evaluation includes B-mode imaging, spectral Doppler, color Doppler, and power Doppler as needed of all accessible portions of each vessel.  Bilateral testing is considered an integral part of a complete examination. Limited examinations for reoccurring indications may be performed as noted. The reflux portion of the exam is performed with the patient in reverse Trendelenburg.  +---------+---------------+---------+-----------+----------+--------------+ RIGHT    CompressibilityPhasicitySpontaneityPropertiesThrombus Aging  +---------+---------------+---------+-----------+----------+--------------+ CFV      Full           Yes      Yes                                 +---------+---------------+---------+-----------+----------+--------------+ SFJ      Full                                                        +---------+---------------+---------+-----------+----------+--------------+ FV Prox  Full                                                        +---------+---------------+---------+-----------+----------+--------------+ FV Mid   Full                                                        +---------+---------------+---------+-----------+----------+--------------+ FV Distal               Yes      Yes                                 +---------+---------------+---------+-----------+----------+--------------+ PFV      Full                                                        +---------+---------------+---------+-----------+----------+--------------+ POP      Full           Yes      Yes                                 +---------+---------------+---------+-----------+----------+--------------+ PTV      Full                                                        +---------+---------------+---------+-----------+----------+--------------+ PERO     Full                                                        +---------+---------------+---------+-----------+----------+--------------+ SSV      None  Acute          +---------+---------------+---------+-----------+----------+--------------+ Thrombus detected in the short saphenous vein is noted to be approximately 1 cm from the confluence with the popliteal vein.  +---------+---------------+---------+-----------+----------+--------------+ LEFT     CompressibilityPhasicitySpontaneityPropertiesThrombus Aging +---------+---------------+---------+-----------+----------+--------------+  CFV      Full           Yes      Yes                                 +---------+---------------+---------+-----------+----------+--------------+ SFJ      Full                                                        +---------+---------------+---------+-----------+----------+--------------+ FV Prox  Full                                                        +---------+---------------+---------+-----------+----------+--------------+ FV Mid   Full                                                        +---------+---------------+---------+-----------+----------+--------------+ FV DistalFull                                                        +---------+---------------+---------+-----------+----------+--------------+ PFV      Full                                                        +---------+---------------+---------+-----------+----------+--------------+ POP      Full           Yes      Yes                                 +---------+---------------+---------+-----------+----------+--------------+ PTV      Full                                                        +---------+---------------+---------+-----------+----------+--------------+ PERO     Full                                                        +---------+---------------+---------+-----------+----------+--------------+     Summary: RIGHT: - Findings consistent with acute superficial vein  thrombosis involving the right small saphenous vein. - There is no evidence of deep vein thrombosis in the lower extremity.  - No cystic structure found in the popliteal fossa. - Thrombus detected in the short saphenous vein is noted to be approximately 1 cm from the confluence with the popliteal vein.  LEFT: - There is no evidence of deep vein thrombosis in the lower extremity.  - No cystic structure found in the popliteal fossa.  *See table(s) above for measurements and observations. Electronically  signed by [name] MD on 02/12/2022 at 4:45:02 PM.    Final    DG Cervical Spine 2 or 3 views  Result Date: 02/02/2022 CLINICAL DATA:  C4 corpectomy and plate removal. EXAM: CERVICAL SPINE - 2-3 VIEW COMPARISON:  02/01/2022 FINDINGS: PA and lateral C-arm images of the cervical spine demonstrate interval C4 corpectomy and strut placement. New anterior plate and screws extending from the C3 to the C5 level. Normal alignment. The upper part of the previously seen C5-C7 plate and screws has been removed with the inferior portion remaining at the C6-7 level. This is not visible on the lateral view due to overlapping of the patient's shoulders. IMPRESSION: Operative changes, as described above. Electronically Signed   By: Claudie Revering M.D.   On: 02/02/2022 13:02   DG C-Arm 1-60 Min-No Report  Result Date: 02/02/2022 Fluoroscopy was utilized by the requesting physician.  No radiographic interpretation.   DG C-Arm 1-60 Min-No Report  Result Date: 02/02/2022 Fluoroscopy was utilized by the requesting physician.  No radiographic interpretation.   DG C-Arm 1-60 Min-No Report  Result Date: 02/02/2022 Fluoroscopy was utilized by the requesting physician.  No radiographic interpretation.   DG Cervical Spine 2 or 3 views  Result Date: 02/01/2022 CLINICAL DATA:  Cervical myelopathy. EXAM: CERVICAL SPINE - 2-3 VIEW COMPARISON:  MRI cervical spine 02/01/2022, CT cervical spine 01/31/2022 FINDINGS: The patient is imaged in a cervical collar. Known oblique fracture within the mid to anterior C4 vertebral body from anterior superior to posteroinferior orientation. Additional oblique linear fracture through the anterior inferior corner of the C3 vertebral body with mild displacement of the anterior inferior corner of the C3 vertebral body, similar to prior CT. No significant retropulsion. Status post C5 through C7 ACDF with osseous fusion at C5-6 and disc space narrowing but non fusion at C6-7 unchanged. The  atlantodens interval is intact. Mild prevertebral soft tissue swelling. IMPRESSION: Known mid and anterior C4 and anterior inferior C3 acute vertebral body fractures. Electronically Signed   By: Yvonne Kendall M.D.   On: 02/01/2022 13:54   MR CERVICAL SPINE WO CONTRAST  Result Date: 02/01/2022 CLINICAL DATA:  Follow-up cervical spine fracture EXAM: MRI CERVICAL SPINE WITHOUT CONTRAST TECHNIQUE: Multiplanar, multisequence MR imaging of the cervical spine was performed. No intravenous contrast was administered. COMPARISON:  CT from yesterday FINDINGS: Alignment: Straightening of the upper cervical spine, unchanged Vertebrae: Known oblique fracture through the C4 body involving both endplates and directed towards the anterior inferior corner of C3 where there is a fracture and visible cleft at the anterior longitudinal ligament on sagittal T2 weighted imaging. No visible posterior element involvement or posterior ligamentous disruption. Prevertebral swelling as expected. C5-6 and C6-7 ACDF with solid arthrodesis at C5-6 at least. No hardware failure by prior CT. Cord: Cord deformity from herniation at C3-4 and especially C4-5. No cord edema. Posterior Fossa, vertebral arteries, paraspinal tissues: Prevertebral edema as noted above. Disc levels: C2-3: Degenerative facet spurring asymmetric to the right. Mild right  foraminal narrowing C3-4: Bilateral paracentral protrusion and buttressing osteophytes extending to the uncovertebral joints with biforaminal impingement. Spinal stenosis is mild with mild ventral cord indentation C4-5: Disc narrowing and bulging with broad central protrusion flattening the cord. Continuation of foraminal disc greater on the left with bilateral uncovertebral spurring and foraminal impingement. C5-6: ACDF.  Patent canal and foramina C6-7: ACDF.  Patent canal and foramina C7-T1:Degenerative facet spurring and left eccentric ridging with foraminal protrusion. Advanced left foraminal  impingement. Intermittent motion artifact. IMPRESSION: 1. Known C4 body and C3 anterior inferior corner fractures. The anterior longitudinal ligament is disrupted at the level of the C3 corner fracture. No visible injury to the posterior elements. 2. C4-5 cord flattening primarily from central disc protrusion. 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 3. Biforaminal impingement at C3-4 and C4-5. Left foraminal impingement at C7-T1. 4. C5-C7 ACDF. Electronically Signed   By: Jorje Guild M.D.   On: 02/01/2022 09:21   CT Cervical Spine Wo Contrast  Result Date: 01/31/2022 CLINICAL DATA:  Fall EXAM: CT CERVICAL SPINE WITHOUT CONTRAST TECHNIQUE: Multidetector CT imaging of the cervical spine was performed without intravenous contrast. Multiplanar CT image reconstructions were also generated. RADIATION DOSE REDUCTION: This exam was performed according to the departmental dose-optimization program which includes automated exposure control, adjustment of the mA and/or kV according to patient size and/or use of iterative reconstruction technique. COMPARISON:  None Available. FINDINGS: Alignment: No subluxation Skull base and vertebrae: Fracture noted through the C4 vertebral body. This extends superiorly to involve the anterior inferior corner/spur at C3. Soft tissues and spinal canal: Mild prevertebral soft tissue swelling anterior to the C3 and C4 vertebral bodies. Central canal stenosis noted at C4-5 due to chronic appearing calcified disc herniation/bulge. Disc levels: Prior anterior fusion from C4-C6. Diffuse advanced degenerative disc disease and facet disease. Upper chest: No acute findings Other: None IMPRESSION: Fracture through the C4 vertebral body in the inferior anterior corner at C3. 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 Critical Value/emergent results were called by telephone at the time of interpretation on 01/31/2022 at 6:57 pm to provider Sherwood Gambler , who verbally  acknowledged these results. Electronically Signed   By: Rolm Baptise M.D.   On: 01/31/2022 19:05   CT Head Wo Contrast  Result Date: 01/31/2022 CLINICAL DATA:  Fall EXAM: CT HEAD WITHOUT CONTRAST TECHNIQUE: Contiguous axial images were obtained from the base of the skull through the vertex without intravenous contrast. RADIATION DOSE REDUCTION: This exam was performed according to the departmental dose-optimization program which includes automated exposure control, adjustment of the mA and/or kV according to patient size and/or use of iterative reconstruction technique. COMPARISON:  None Available. FINDINGS: Brain: No acute intracranial abnormality. Specifically, no hemorrhage, hydrocephalus, mass lesion, acute infarction, or significant intracranial injury. Vascular: No hyperdense vessel or unexpected calcification. Skull: No acute calvarial abnormality. Sinuses/Orbits: No acute findings Other: None IMPRESSION: No acute intracranial abnormality. Electronically Signed   By: Rolm Baptise M.D.   On: 01/31/2022 18:57   DG Hips Bilat W or Wo Pelvis 2 Views  Result Date: 01/31/2022 CLINICAL DATA:  Fall, left side pain EXAM: DG HIP (WITH OR WITHOUT PELVIS) 2V BILAT COMPARISON:  None Available. FINDINGS: Symmetric degenerative changes in the hips with joint space narrowing and spurring. SI joints symmetric. No acute bony abnormality. Specifically, no fracture, subluxation, or dislocation. IMPRESSION: No acute bony abnormality. Electronically Signed   By: Rolm Baptise M.D.   On: 01/31/2022 18:57   DG Knee  Complete 4 Views Left  Result Date: 01/31/2022 CLINICAL DATA:  Fall, left side pain EXAM: LEFT KNEE - COMPLETE 4+ VIEW COMPARISON:  None Available. FINDINGS: Slight joint space narrowing in the medial and lateral compartments. No acute bony abnormality. Specifically, no fracture, subluxation, or dislocation. No joint effusion. IMPRESSION: No acute bony abnormality. Electronically Signed   By: Rolm Baptise M.D.    On: 01/31/2022 18:56   DG Hand Complete Left  Result Date: 01/31/2022 CLINICAL DATA:  Fall, left side pain EXAM: LEFT HAND - COMPLETE 3+ VIEW COMPARISON:  None Available. FINDINGS: No acute bony abnormality. Specifically, no fracture, subluxation, or dislocation. Degenerative changes in the IP joints, most pronounced in the 2nd and 3rd DIP joints as well as the 1st carpometacarpal joint. Soft tissues are intact. IMPRESSION: No acute bony abnormality. Electronically Signed   By: Rolm Baptise M.D.   On: 01/31/2022 18:56    Labs:  Basic Metabolic Panel: Recent Labs  Lab 02/19/22 0501 02/23/22 0515  NA 136 137  K 3.6 3.8  CL 97* 102  CO2 28 26  GLUCOSE 125* 131*  BUN <5* <5*  CREATININE 0.76 0.85  CALCIUM 8.9 9.2    CBC: Recent Labs  Lab 02/19/22 0501 02/23/22 0515  WBC 7.0 6.4  NEUTROABS  --  3.5  HGB 9.1* 9.8*  HCT 28.8* 30.7*  MCV 87.8 87.2  PLT 381 390    CBG: No results for input(s): "GLUCAP" in the last 168 hours.  Family history.  Positive for hypertension as well as hyperlipidemia.  Denies any colon cancer esophageal cancer or rectal cancer  Brief HPI:   Shelby Fleming is a 76 y.o. right-handed female with history of hypertension psoriasis as well as prior ACDF.  Per chart review lives alone independent prior to admission.  Presented 01/31/2022 after mechanical fall.  By report patient tripped and fell into her storm door on her left side with severe dysesthesias to all 4 extremities.  No loss of consciousness.  Denied any syncope or associated chest pain with the fall.  Cranial CT scan negative.  Bilateral hip and pelvis films negative.  CT cervical spine showed fracture through the C4 vertebral body and the inferior anterior corner at C3.  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  MRI cervical spine again known C4 body and C3 anterior inferior corner fractures.  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  No  visible injury to the posterior elements.  C4-5 cord flattening primarily from central disc protrusion.  Mild degenerative stenosis C3-4.  Foraminal impingement at C3-4 4-5.  Left foraminal impingement of C7-T1 as well as C5-7 ACDF.  Underwent C4 corpectomy anterior interbody technique including discectomies and bilateral foraminotomies.  Placement of corpectomy cage at C4.  Placement of anterior instrumentation consisting of cervical plate and screws 03/06/5283 per Dr. Duffy Rhody.  Placed in a cervical hard collar.  She was cleared to begin Lovenox for DVT prophylaxis 02/03/2022.  Her diet had been advanced to regular.  She did have some urinary retention suspect neurogenic bladder initially placed on Flomax since discontinued with latest bladder scan 451 mL refused intermittent catheterization.  Therapy evaluations completed due to patient decreased functional mobility was admitted for a comprehensive rehab program.   Hospital Course: Shuntell Foody was admitted to rehab 02/11/2022 for inpatient therapies to consist of PT, ST and OT at least three hours five days a week. Past admission physiatrist, therapy team and rehab  RN have worked together to provide customized collaborative inpatient rehab.  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  Status post C4 corpectomy with discectomies and bilateral foraminotomies 05/27/2022.  Cervical collar as directed she would follow-up neurosurgery surgical site healing nicely functional ability continue to improve.  Venous Doppler studies x2 consistent with acute superficial vein thrombosis involving the right small saphenous vein no propagation noted.  Subcutaneous Lovenox transition to Eliquis at discharge x6 weeks. Postoperative pain management tramadol with Neurontin scheduled Flexeril as indicated with oxycodone for breakthrough pain.  Neurogenic bowel and bladder patient initially refused intermittent  catheterization establish bowel program she was voiding much better at time of discharge with improved scans.  Blood pressure controlled on present regimen she would need outpatient follow-up patient had been on lisinopril prior to admission remained on hold.  Lovaza for hyperlipidemia.   Blood pressures were monitored on TID basis and soft and monitored     Rehab course: During patient's stay in rehab weekly team conferences were held to monitor patient's progress, set goals and discuss barriers to discharge. At admission, patient required minimal guard ambulation 30 feet rolling walker  Physical exam.  Blood pressure 118/70 pulse 80 temperature 98 respirations 18 oxygen saturations 92% room air Constitutional.  No acute distress HEENT Head.  Normocephalic and atraumatic Eyes.  Pupils round and reactive to light no discharge without nystagmus Neck.  Cervical collar in place Cardiac regular rate and rhythm without any extra sounds or murmur heard Abdomen.  Soft nontender positive bowel sounds without rebound Respiratory effort normal no respiratory distress without wheeze Cardiac regular rate and rhythm without any extra sounds or murmur heard Abdomen.  Soft nontender positive bowel sounds without rebound Skin.  Intact Neurologic.  Alert oriented x3.  Right upper extremity 4/5 left upper extremity 3/5, 4/5 in lower extremities bilaterally  He/She  has had improvement in activity tolerance, balance, postural control as well as ability to compensate for deficits. He/She has had improvement in functional use RUE/LUE  and RLE/LLE as well as improvement in awareness.  Patient ambulating 125 feet x 2 rolling walker contact-guard assist.  Sit to stand without assistive device high level independent ADLs dynamic standing balance patient donned shoes with minimal assist for tying them complete sit to stand transfer rolling walker modified independent.  Supervision and engages in bed making tasks  without rolling walker.  Patient able to retrieve pillowcase with contact-guard from dresser was provided sheet and blanket with set up.  Full family teaching completed plan discharged home contact-guard.  She can increase her ambulation up to 160 feet.       Disposition: Discharged home    Diet: Regular  Special Instructions: No driving smoking or alcohol  Cervical collar as directed  Medications at discharge. 1.  Tylenol as needed 2.Apremilast 30 mg twice daily 3.  Dulcolax suppository daily as needed 4.  Flexeril 10 mg p.o. 3 times daily as needed 5.  Voltaren gel 4 g 4 times a day to affected area 6.  Colace 100 mg p.o. twice daily 7.  Neurontin 100 mg p.o. twice daily 8.  Lovaza 1 mg p.o. twice daily 9.  Oxycodone 10 mg every 4 hours as needed pain 10.  MiraLAX daily 11.Eliquis 2.5 mg BID x 6 weeks   30-35 minutes were spent completing discharge summary and discharge planning      Follow-up Information     Lovorn, Jinny Blossom, MD Follow up.  Specialty: Physical Medicine and Rehabilitation Why: Office to call for appointment Contact information: [address] [address] [phone]         Vallarie Mare, MD Follow up.   Specialty: Neurosurgery Why: Call for appointment Contact information: [address] [place] [address] [phone]                 Signed: Cathlyn Parsons 02/24/2022, 5:36 AM

## 2022-02-22 NOTE — Progress Notes (Signed)
Occupational Therapy Session Note  Patient Details  Name: Shelby Fleming MRN: [id number] Date of Birth: 12/20/1945  Today's Date: 02/22/2022 OT Individual Time: 5102-5852 OT Individual Time Calculation (min): 45 min    Short Term Goals: Week 2:  OT Short Term Goal 1 (Week 2): STG = LTG (due to ELOS)  Skilled Therapeutic Interventions/Progress Updates:     Upon OT arrival, pt seated in w/c and reports no pain. Pt states "Those exercises helped for my neck and shoulder you taught me". Pt agreeable to OT session. OT intervention with a focus on LB dressing, higher level IADLs, and dynamic standing balance. Pt donns shoes with Min A for tying then completes sit to stand transfer with RW and Mod I. Pt ambulates to rehab apartment with RW and Supervision, donns hand sanitizer with Supervision and engages in bed making task without RW. Pt able to retrieve pillow case with CGA from dresser and was provided sheet and blanket with Setup. Pt able to ambulate multiple times around bed without DME or UE support with CGA. No LOB observed. Pt takes 2 seated rest breaks, one on couch and one on recliner with CGA secondary to being a lower surface. Pt stands to close and open blinds without UE support and CGA. Pt then ambulates back to her room with RW and Supervision. Pt completes stand to sit transfer Mod I. Pt making progress towards stated OT goals and continues to benefit from OT services to achieve highest level of independence. Pt left in w/c with all needs met.   Therapy Documentation Precautions:  Precautions Precautions: Fall, Cervical Required Braces or Orthoses: Cervical Brace Cervical Brace: Hard collar, At all times Restrictions Weight Bearing Restrictions: No  ADL: Lower Body Dressing: Minimal assistance (shoes) Where Assessed-Lower Body Dressing: Wheelchair    Therapy/Group: Individual Therapy  [name] 02/22/2022, 11:10 AM

## 2022-02-22 NOTE — Progress Notes (Signed)
PROGRESS NOTE   Subjective/Complaints:   Reports continued swelling in her b/l legs/feet but it is slightly improved. No new concerns.    ROS:    Pt denies fever, chills, SOB, abd pain, CP, N/V/C/D. No vision changes  Objective:   No results found. No results for input(s): "WBC", "HGB", "HCT", "PLT" in the last 72 hours.   No results for input(s): "NA", "K", "CL", "CO2", "GLUCOSE", "BUN", "CREATININE", "CALCIUM" in the last 72 hours.   Intake/Output Summary (Last 24 hours) at 02/22/2022 1045 Last data filed at 02/22/2022 0930 Gross per 24 hour  Intake 118 ml  Output --  Net 118 ml          Physical Exam: Vital Signs Blood pressure 126/68, pulse 89, temperature 97.8 F (36.6 C), temperature source Oral, resp. rate 16, height '5\' 4"'$  (1.626 m), weight 87.2 kg, SpO2 96 %.      General: awake, alert, appropriate, in chair HENT: conjugate gaze; oropharynx moist- wearing cervical collar- honeycomb dressing in place CV: RRR Pulmonary: CTA B/L, no RRW GI: soft, NT, ND, BS positive Psychiatric: appropriate, pleasant  Neurological: Ox3, follows commands Extremities: No clubbing, cyanosis, or edema. Pulses are 2+ Psych: very pleasant Skin: Incision site is clean dry and intact does have areas of psoriasis. Warm and dry otherwise Note: 1) Patient does have red moist macerated area under her bilateral breasts, which is improving. She was seen by [facility] nurse with recommendations for silver impregnated fabric to be used for 5 days at a time.   2) patient also has edema nonpitting bilateral ankles, medial malleolus and lateral maleollus, LE edema appears improved from several days ago  Has TED hose and ACE wrap in place, during day. Neuro: Patient is alert.  Oriented x3 and follows commands well Musculoskeletal: She has hard cervical collar in place, arthritis of small hand joints.  RUE 4/5, LUE 3/5, 4/5 in lower extremities  bilaterally.  Assessment/Plan: 1. Functional deficits which require 3+ hours per day of interdisciplinary therapy in a comprehensive inpatient rehab setting. Physiatrist is providing close team supervision and 24 hour management of active medical problems listed below. Physiatrist and rehab team continue to assess barriers to discharge/monitor patient progress toward functional and medical goals  Care Tool:  Bathing    Body parts bathed by patient: Right arm, Left arm, Chest, Abdomen, Front perineal area, Right upper leg, Left upper leg, Face   Body parts bathed by helper: Left lower leg, Right lower leg, Buttocks     Bathing assist Assist Level: Moderate Assistance - Patient 50 - 74%     Upper Body Dressing/Undressing Upper body dressing   What is the patient wearing?: Bra, Pull over shirt, Orthosis (cervical collar in place) Orthosis activity level: Performed by helper  Upper body assist Assist Level: Moderate Assistance - Patient 50 - 74%    Lower Body Dressing/Undressing Lower body dressing      What is the patient wearing?: Underwear/pull up, Pants     Lower body assist Assist for lower body dressing: Moderate Assistance - Patient 50 - 74%     Toileting Toileting    Toileting assist Assist for toileting: Maximal Assistance - Patient  25 - 49%     Transfers Chair/bed transfer  Transfers assist     Chair/bed transfer assist level: Supervision/Verbal cueing     Locomotion Ambulation   Ambulation assist      Assist level: Minimal Assistance - Patient > 75% Assistive device: No Device Max distance: 188f   Walk 10 feet activity   Assist     Assist level: Minimal Assistance - Patient > 75% Assistive device: No Device   Walk 50 feet activity   Assist Walk 50 feet with 2 turns activity did not occur: Safety/medical concerns  Assist level: Minimal Assistance - Patient > 75% Assistive device: No Device    Walk 150 feet activity   Assist  Walk 150 feet activity did not occur: Safety/medical concerns  Assist level: Minimal Assistance - Patient > 75% Assistive device: No Device    Walk 10 feet on uneven surface  activity   Assist Walk 10 feet on uneven surfaces activity did not occur: Safety/medical concerns         Wheelchair     Assist Is the patient using a wheelchair?: Yes Type of Wheelchair: Manual    Wheelchair assist level: Dependent - Patient 0%      Wheelchair 50 feet with 2 turns activity    Assist        Assist Level: Dependent - Patient 0%   Wheelchair 150 feet activity     Assist      Assist Level: Dependent - Patient 0%   Blood pressure 126/68, pulse 89, temperature 97.8 F (36.6 C), temperature source Oral, resp. rate 16, height '5\' 4"'$  (1.626 m), weight 87.2 kg, SpO2 96 %.    Medical Problem List and Plan: 1. 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  Cervical collar as directed             -patient may shower             -ELOS/Goals: 5-7 days S             -Con't CIR- PT and OT  -She would likely benefit from hospital bed  -Discharge date expected 6/21, min A to Mod I  -Ambulated 125 feet with RW 2.  Antithrombotics: -DVT/anticoagulation:  Pharmaceutical: Lovenox check vascular study  6/13- has superficial Venous thrombosis very low down- will not treat- per CHEST guidelines             -antiplatelet therapy: N/A 3. Postoperative pain: continue Tramadol 50 mg every 6 hours, Neurontin 100 mg twice daily, Flexeril 10 mg 3 times daily as needed, oxycodone as needed             6/8- pain controlled with scheduled tramadol  6/12- pain controlled, but still bothersome in R upper arm still  6/15 RUE pain controlled with oxycodone 4. Mood: Provide emotional support              -antipsychotic agents: N/A 5. Neuropsych: This patient is capable of making decisions on her own behalf. 6. Skin/Wound Care: Routine skin checks 7. Fluids/Electrolytes/Nutrition: Routine in and outs with follow-up chemistries 8.  Neurogenic bowel and bladder.  Check PVR.  Patient had initially refused intermittent catheterization.  Establish bowel program             6/8- will order bladder scans to see if retaining still- not clear if needs bowel  program quite yet.              6/9- is voiding- will con't to monitor to see if needs caths 6/11 Patient is voiding okay without needing caths  6/12- LBM yesterday small- bladder scans <100cc- con'to monitor  6/15 BM yesterday, bladder scans 0-158m  6/16 further bladder scan yesterday <100  6/18 Appears to be voiding well without need for caths, BM yesterday 9.  Dysphagia.  Improved.  Diet advanced to regular 10.  Hypertension.  Patient on lisinopril 40 mg daily prior to admission.  Resume as needed             6/8- BP well controlled off Lisinopril  6/18 BP well controlled overall, continue to follow    02/22/2022    3:21 AM 02/21/2022    7:28 PM 02/21/2022    1:33 PM  Vitals with BMI  Systolic [id number] Diastolic 68 68 50  Pulse 89 96 91    11.  Hyperlipidemia.  Restart Lovaza 2 g twice daily 12.  Psoriasis.  Restart Oteleza 30 mg twice daily             6/8- will send Otezla and cream to pharmacy so can use.  6/11 patient to continue Oteleza 30 mg BID 13. 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 Hypokalemia             6/8- K+ 2.9- will give KCL 40 mEq x3 and recheck in AM- was last 3.7             6/9- K+ 4.2 this AM- will recheck Monday            6/12- K+ 3.5- will put on low daily dose of KCL 10 mEq daily and recheck Thursday  6/15- K+ 3.6 today, continue low dose K+  Labs tomorrow 15. MASD under breasts             6/9- will consult [facility]OC for recommendations- is severe. With open skin 16. Constipation             6/9- Will  order sorbitol after therapy.  6/10 Patient's last BM was today sorbitol worked well.  6/12- LBM yesterday  6/15 BM yesterday  6/17 BM today 17. Moderate malnutrition             6/9- will add Ensure BID between meals 18. Dry mouth             6/9- doesn't have thrush on exam- will order Biotene mouthwash   6/13- Mouth less dry with Biotene 19.  Bilateral lower ankle edema 6/10 patient says that she has had edema that started on Thursday.  She says that it is mostly in the ankles wonders if it could be due to her psoriatic arthritis.  Some of the swelling on the anterior right ankle has subsided overall there is greater edema in the left ankle versus right there is no edema of the legs and +1 nonpitting edema the dorsal surface of both feet.  Edema is +2 nonpitting for the ankles.  Encourage use of elevation for suspected dependent edema if this does not work may also add on use of Ace wrap. 6/11 PT placed TED hose (ankle high) on pt then placed ACE wrap over. Swelling of lower legs and ankles less today than yesterday.  Continue elevation of legs with pillows.   6/12- LE edema looking better- con't regimen  6/18 continue TED and leg elevation  LOS: 11 days A FACE TO FACE EVALUATION WAS PERFORMED  Jennye Boroughs 02/22/2022, 10:45 AM

## 2022-02-22 NOTE — Discharge Summary (Incomplete Revision)
Physician Discharge Summary  Patient ID: Jalina Blowers MRN: [id number] DOB/AGE: June 08, 1946 76 y.o.  Admit date: 02/11/2022 Discharge date: 02/25/2022  Discharge Diagnoses:  Principal Problem:   Central cord syndrome [facility]) Active Problems:   Pressure injury of skin DVT prophylaxis/superficial venous thrombosis involving the right small saphenous vein Neurogenic bowel and bladder Hypertension Hyperlipidemia Psoriasis Constipation  Discharged Condition: Stable  Significant Diagnostic Studies: VAS [place] LOWER EXTREMITY VENOUS (DVT)  Result Date: 02/16/2022  Lower Venous DVT Study Patient Name:  CARLOTA PHILLEY  Date of Exam:   02/16/2022 Medical Rec #: [id number]    Accession #:    [id number] Date of Birth: [birth year]     Patient Gender: F Patient Age:   75 years Exam Location:  [name] Procedure:      VAS [place] LOWER EXTREMITY VENOUS (DVT) Referring Phys: Lauraine Rinne --------------------------------------------------------------------------------  Indications: Swelling.  Comparison Study: 02/12/22, positive acute right superficial thrombophlebitis of                   the small saphenous vein. Performing Technologist: Bobetta Lime  Examination Guidelines: A complete evaluation includes B-mode imaging, spectral Doppler, color Doppler, and power Doppler as needed of all accessible portions of each vessel. Bilateral testing is considered an integral part of a complete examination. Limited examinations for reoccurring indications may be performed as noted. The reflux portion of the exam is performed with the patient in reverse Trendelenburg.  +---------+---------------+---------+-----------+------------+----------------+ RIGHT    CompressibilityPhasicitySpontaneityProperties  Thrombus Aging   +---------+---------------+---------+-----------+------------+----------------+ CFV      Full           Yes      Yes                                      +---------+---------------+---------+-----------+------------+----------------+ SFJ      Full                                                            +---------+---------------+---------+-----------+------------+----------------+ FV Prox  Full                                                            +---------+---------------+---------+-----------+------------+----------------+ FV Mid   Full                                                            +---------+---------------+---------+-----------+------------+----------------+ FV DistalFull                                                            +---------+---------------+---------+-----------+------------+----------------+ PFV      Full                                                            +---------+---------------+---------+-----------+------------+----------------+  POP      Full           Yes      Yes                                     +---------+---------------+---------+-----------+------------+----------------+ PTV      Full                                                            +---------+---------------+---------+-----------+------------+----------------+ PERO     Full                                                            +---------+---------------+---------+-----------+------------+----------------+ SSV      None                               with        Age                                                          striations  Indeterminate    +---------+---------------+---------+-----------+------------+----------------+ No progression noted of the thrombus in the small saphenous vein.  +----+---------------+---------+-----------+----------+--------------+ LEFTCompressibilityPhasicitySpontaneityPropertiesThrombus Aging +----+---------------+---------+-----------+----------+--------------+ CFV Full           Yes      Yes                                  +----+---------------+---------+-----------+----------+--------------+    Summary: RIGHT: - Findings consistent with age indeterminate superficial vein thrombosis involving the right small saphenous vein. - Findings appear essentially unchanged compared to previous examination. - There is no evidence of deep vein thrombosis in the lower extremity.  - No cystic structure found in the popliteal fossa.  LEFT: - No evidence of common femoral vein obstruction. - No cystic structure found in the popliteal fossa.  *See table(s) above for measurements and observations. Electronically signed by Servando Snare MD on 02/16/2022 at 6:03:15 PM.    Final    VAS [place] LOWER EXTREMITY VENOUS (DVT)  Result Date: 02/12/2022  Lower Venous DVT Study Patient Name:  ETTA GASSETT  Date of Exam:   02/12/2022 Medical Rec #: [id number]    Accession #:    [id number] Date of Birth: December 10, 1945     Patient Gender: F Patient Age:   33 years Exam Location:  [facility] Procedure:      VAS [place] LOWER EXTREMITY VENOUS (DVT) Referring Phys: Lauraine Rinne --------------------------------------------------------------------------------  Indications: Swelling.  Risk Factors: None identified. Limitations: Poor ultrasound/tissue interface and patient positioning. Comparison Study: No prior studies. Performing Technologist: Oliver Hum RVT  Examination Guidelines: A complete evaluation includes B-mode imaging, spectral Doppler, color Doppler, and power Doppler as needed of all accessible portions of each vessel.  Bilateral testing is considered an integral part of a complete examination. Limited examinations for reoccurring indications may be performed as noted. The reflux portion of the exam is performed with the patient in reverse Trendelenburg.  +---------+---------------+---------+-----------+----------+--------------+ RIGHT    CompressibilityPhasicitySpontaneityPropertiesThrombus Aging  +---------+---------------+---------+-----------+----------+--------------+ CFV      Full           Yes      Yes                                 +---------+---------------+---------+-----------+----------+--------------+ SFJ      Full                                                        +---------+---------------+---------+-----------+----------+--------------+ FV Prox  Full                                                        +---------+---------------+---------+-----------+----------+--------------+ FV Mid   Full                                                        +---------+---------------+---------+-----------+----------+--------------+ FV Distal               Yes      Yes                                 +---------+---------------+---------+-----------+----------+--------------+ PFV      Full                                                        +---------+---------------+---------+-----------+----------+--------------+ POP      Full           Yes      Yes                                 +---------+---------------+---------+-----------+----------+--------------+ PTV      Full                                                        +---------+---------------+---------+-----------+----------+--------------+ PERO     Full                                                        +---------+---------------+---------+-----------+----------+--------------+ SSV      None  Acute          +---------+---------------+---------+-----------+----------+--------------+ Thrombus detected in the short saphenous vein is noted to be approximately 1 cm from the confluence with the popliteal vein.  +---------+---------------+---------+-----------+----------+--------------+ LEFT     CompressibilityPhasicitySpontaneityPropertiesThrombus Aging +---------+---------------+---------+-----------+----------+--------------+  CFV      Full           Yes      Yes                                 +---------+---------------+---------+-----------+----------+--------------+ SFJ      Full                                                        +---------+---------------+---------+-----------+----------+--------------+ FV Prox  Full                                                        +---------+---------------+---------+-----------+----------+--------------+ FV Mid   Full                                                        +---------+---------------+---------+-----------+----------+--------------+ FV DistalFull                                                        +---------+---------------+---------+-----------+----------+--------------+ PFV      Full                                                        +---------+---------------+---------+-----------+----------+--------------+ POP      Full           Yes      Yes                                 +---------+---------------+---------+-----------+----------+--------------+ PTV      Full                                                        +---------+---------------+---------+-----------+----------+--------------+ PERO     Full                                                        +---------+---------------+---------+-----------+----------+--------------+     Summary: RIGHT: - Findings consistent with acute superficial vein  thrombosis involving the right small saphenous vein. - There is no evidence of deep vein thrombosis in the lower extremity.  - No cystic structure found in the popliteal fossa. - Thrombus detected in the short saphenous vein is noted to be approximately 1 cm from the confluence with the popliteal vein.  LEFT: - There is no evidence of deep vein thrombosis in the lower extremity.  - No cystic structure found in the popliteal fossa.  *See table(s) above for measurements and observations. Electronically  signed by [name] MD on 02/12/2022 at 4:45:02 PM.    Final    DG Cervical Spine 2 or 3 views  Result Date: 02/02/2022 CLINICAL DATA:  C4 corpectomy and plate removal. EXAM: CERVICAL SPINE - 2-3 VIEW COMPARISON:  02/01/2022 FINDINGS: PA and lateral C-arm images of the cervical spine demonstrate interval C4 corpectomy and strut placement. New anterior plate and screws extending from the C3 to the C5 level. Normal alignment. The upper part of the previously seen C5-C7 plate and screws has been removed with the inferior portion remaining at the C6-7 level. This is not visible on the lateral view due to overlapping of the patient's shoulders. IMPRESSION: Operative changes, as described above. Electronically Signed   By: Claudie Revering M.D.   On: 02/02/2022 13:02   DG C-Arm 1-60 Min-No Report  Result Date: 02/02/2022 Fluoroscopy was utilized by the requesting physician.  No radiographic interpretation.   DG C-Arm 1-60 Min-No Report  Result Date: 02/02/2022 Fluoroscopy was utilized by the requesting physician.  No radiographic interpretation.   DG C-Arm 1-60 Min-No Report  Result Date: 02/02/2022 Fluoroscopy was utilized by the requesting physician.  No radiographic interpretation.   DG Cervical Spine 2 or 3 views  Result Date: 02/01/2022 CLINICAL DATA:  Cervical myelopathy. EXAM: CERVICAL SPINE - 2-3 VIEW COMPARISON:  MRI cervical spine 02/01/2022, CT cervical spine 01/31/2022 FINDINGS: The patient is imaged in a cervical collar. Known oblique fracture within the mid to anterior C4 vertebral body from anterior superior to posteroinferior orientation. Additional oblique linear fracture through the anterior inferior corner of the C3 vertebral body with mild displacement of the anterior inferior corner of the C3 vertebral body, similar to prior CT. No significant retropulsion. Status post C5 through C7 ACDF with osseous fusion at C5-6 and disc space narrowing but non fusion at C6-7 unchanged. The  atlantodens interval is intact. Mild prevertebral soft tissue swelling. IMPRESSION: Known mid and anterior C4 and anterior inferior C3 acute vertebral body fractures. Electronically Signed   By: Yvonne Kendall M.D.   On: 02/01/2022 13:54   MR CERVICAL SPINE WO CONTRAST  Result Date: 02/01/2022 CLINICAL DATA:  Follow-up cervical spine fracture EXAM: MRI CERVICAL SPINE WITHOUT CONTRAST TECHNIQUE: Multiplanar, multisequence MR imaging of the cervical spine was performed. No intravenous contrast was administered. COMPARISON:  CT from yesterday FINDINGS: Alignment: Straightening of the upper cervical spine, unchanged Vertebrae: Known oblique fracture through the C4 body involving both endplates and directed towards the anterior inferior corner of C3 where there is a fracture and visible cleft at the anterior longitudinal ligament on sagittal T2 weighted imaging. No visible posterior element involvement or posterior ligamentous disruption. Prevertebral swelling as expected. C5-6 and C6-7 ACDF with solid arthrodesis at C5-6 at least. No hardware failure by prior CT. Cord: Cord deformity from herniation at C3-4 and especially C4-5. No cord edema. Posterior Fossa, vertebral arteries, paraspinal tissues: Prevertebral edema as noted above. Disc levels: C2-3: Degenerative facet spurring asymmetric to the right. Mild right  foraminal narrowing C3-4: Bilateral paracentral protrusion and buttressing osteophytes extending to the uncovertebral joints with biforaminal impingement. Spinal stenosis is mild with mild ventral cord indentation C4-5: Disc narrowing and bulging with broad central protrusion flattening the cord. Continuation of foraminal disc greater on the left with bilateral uncovertebral spurring and foraminal impingement. C5-6: ACDF.  Patent canal and foramina C6-7: ACDF.  Patent canal and foramina C7-T1:Degenerative facet spurring and left eccentric ridging with foraminal protrusion. Advanced left foraminal  impingement. Intermittent motion artifact. IMPRESSION: 1. Known C4 body and C3 anterior inferior corner fractures. The anterior longitudinal ligament is disrupted at the level of the C3 corner fracture. No visible injury to the posterior elements. 2. C4-5 cord flattening primarily from central disc protrusion. 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 3. Biforaminal impingement at C3-4 and C4-5. Left foraminal impingement at C7-T1. 4. C5-C7 ACDF. Electronically Signed   By: Jorje Guild M.D.   On: 02/01/2022 09:21   CT Cervical Spine Wo Contrast  Result Date: 01/31/2022 CLINICAL DATA:  Fall EXAM: CT CERVICAL SPINE WITHOUT CONTRAST TECHNIQUE: Multidetector CT imaging of the cervical spine was performed without intravenous contrast. Multiplanar CT image reconstructions were also generated. RADIATION DOSE REDUCTION: This exam was performed according to the departmental dose-optimization program which includes automated exposure control, adjustment of the mA and/or kV according to patient size and/or use of iterative reconstruction technique. COMPARISON:  None Available. FINDINGS: Alignment: No subluxation Skull base and vertebrae: Fracture noted through the C4 vertebral body. This extends superiorly to involve the anterior inferior corner/spur at C3. Soft tissues and spinal canal: Mild prevertebral soft tissue swelling anterior to the C3 and C4 vertebral bodies. Central canal stenosis noted at C4-5 due to chronic appearing calcified disc herniation/bulge. Disc levels: Prior anterior fusion from C4-C6. Diffuse advanced degenerative disc disease and facet disease. Upper chest: No acute findings Other: None IMPRESSION: Fracture through the C4 vertebral body in the inferior anterior corner at C3. 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 Critical Value/emergent results were called by telephone at the time of interpretation on 01/31/2022 at 6:57 pm to provider Sherwood Gambler , who verbally  acknowledged these results. Electronically Signed   By: Rolm Baptise M.D.   On: 01/31/2022 19:05   CT Head Wo Contrast  Result Date: 01/31/2022 CLINICAL DATA:  Fall EXAM: CT HEAD WITHOUT CONTRAST TECHNIQUE: Contiguous axial images were obtained from the base of the skull through the vertex without intravenous contrast. RADIATION DOSE REDUCTION: This exam was performed according to the departmental dose-optimization program which includes automated exposure control, adjustment of the mA and/or kV according to patient size and/or use of iterative reconstruction technique. COMPARISON:  None Available. FINDINGS: Brain: No acute intracranial abnormality. Specifically, no hemorrhage, hydrocephalus, mass lesion, acute infarction, or significant intracranial injury. Vascular: No hyperdense vessel or unexpected calcification. Skull: No acute calvarial abnormality. Sinuses/Orbits: No acute findings Other: None IMPRESSION: No acute intracranial abnormality. Electronically Signed   By: Rolm Baptise M.D.   On: 01/31/2022 18:57   DG Hips Bilat W or Wo Pelvis 2 Views  Result Date: 01/31/2022 CLINICAL DATA:  Fall, left side pain EXAM: DG HIP (WITH OR WITHOUT PELVIS) 2V BILAT COMPARISON:  None Available. FINDINGS: Symmetric degenerative changes in the hips with joint space narrowing and spurring. SI joints symmetric. No acute bony abnormality. Specifically, no fracture, subluxation, or dislocation. IMPRESSION: No acute bony abnormality. Electronically Signed   By: Rolm Baptise M.D.   On: 01/31/2022 18:57   DG Knee  Complete 4 Views Left  Result Date: 01/31/2022 CLINICAL DATA:  Fall, left side pain EXAM: LEFT KNEE - COMPLETE 4+ VIEW COMPARISON:  None Available. FINDINGS: Slight joint space narrowing in the medial and lateral compartments. No acute bony abnormality. Specifically, no fracture, subluxation, or dislocation. No joint effusion. IMPRESSION: No acute bony abnormality. Electronically Signed   By: Rolm Baptise M.D.    On: 01/31/2022 18:56   DG Hand Complete Left  Result Date: 01/31/2022 CLINICAL DATA:  Fall, left side pain EXAM: LEFT HAND - COMPLETE 3+ VIEW COMPARISON:  None Available. FINDINGS: No acute bony abnormality. Specifically, no fracture, subluxation, or dislocation. Degenerative changes in the IP joints, most pronounced in the 2nd and 3rd DIP joints as well as the 1st carpometacarpal joint. Soft tissues are intact. IMPRESSION: No acute bony abnormality. Electronically Signed   By: Rolm Baptise M.D.   On: 01/31/2022 18:56    Labs:  Basic Metabolic Panel: Recent Labs  Lab 02/19/22 0501 02/23/22 0515  NA 136 137  K 3.6 3.8  CL 97* 102  CO2 28 26  GLUCOSE 125* 131*  BUN <5* <5*  CREATININE 0.76 0.85  CALCIUM 8.9 9.2    CBC: Recent Labs  Lab 02/19/22 0501 02/23/22 0515  WBC 7.0 6.4  NEUTROABS  --  3.5  HGB 9.1* 9.8*  HCT 28.8* 30.7*  MCV 87.8 87.2  PLT 381 390    CBG: No results for input(s): "GLUCAP" in the last 168 hours.  Family history.  Positive for hypertension as well as hyperlipidemia.  Denies any colon cancer esophageal cancer or rectal cancer  Brief HPI:   Gilda Abboud is a 76 y.o. right-handed female with history of hypertension psoriasis as well as prior ACDF.  Per chart review lives alone independent prior to admission.  Presented 01/31/2022 after mechanical fall.  By report patient tripped and fell into her storm door on her left side with severe dysesthesias to all 4 extremities.  No loss of consciousness.  Denied any syncope or associated chest pain with the fall.  Cranial CT scan negative.  Bilateral hip and pelvis films negative.  CT cervical spine showed fracture through the C4 vertebral body and the inferior anterior corner at C3.  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  MRI cervical spine again known C4 body and C3 anterior inferior corner fractures.  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  No  visible injury to the posterior elements.  C4-5 cord flattening primarily from central disc protrusion.  Mild degenerative stenosis C3-4.  Foraminal impingement at C3-4 4-5.  Left foraminal impingement of C7-T1 as well as C5-7 ACDF.  Underwent C4 corpectomy anterior interbody technique including discectomies and bilateral foraminotomies.  Placement of corpectomy cage at C4.  Placement of anterior instrumentation consisting of cervical plate and screws 7/82/9562 per Dr. Duffy Rhody.  Placed in a cervical hard collar.  She was cleared to begin Lovenox for DVT prophylaxis 02/03/2022.  Her diet had been advanced to regular.  She did have some urinary retention suspect neurogenic bladder initially placed on Flomax since discontinued with latest bladder scan 451 mL refused intermittent catheterization.  Therapy evaluations completed due to patient decreased functional mobility was admitted for a comprehensive rehab program.   Hospital Course: Ramaya Guile was admitted to rehab 02/11/2022 for inpatient therapies to consist of PT, ST and OT at least three hours five days a week. Past admission physiatrist, therapy team and rehab  RN have worked together to provide customized collaborative inpatient rehab.  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  Status post C4 corpectomy with discectomies and bilateral foraminotomies 05/27/2022.  Cervical collar as directed she would follow-up neurosurgery surgical site healing nicely functional ability continue to improve.  Venous Doppler studies x2 consistent with acute superficial vein thrombosis involving the right small saphenous vein no propagation noted.  No plan to treat per chest guidelines.  Postoperative pain management tramadol with Neurontin scheduled Flexeril as indicated with oxycodone for breakthrough pain.  Neurogenic bowel and bladder patient initially refused intermittent catheterization establish  bowel program she was voiding much better at time of discharge with improved scans.  Blood pressure controlled on present regimen she would need outpatient follow-up patient had been on lisinopril prior to admission remained on hold.  Lovaza for hyperlipidemia.   Blood pressures were monitored on TID basis and soft and monitored     Rehab course: During patient's stay in rehab weekly team conferences were held to monitor patient's progress, set goals and discuss barriers to discharge. At admission, patient required minimal guard ambulation 30 feet rolling walker  Physical exam.  Blood pressure 118/70 pulse 80 temperature 98 respirations 18 oxygen saturations 92% room air Constitutional.  No acute distress HEENT Head.  Normocephalic and atraumatic Eyes.  Pupils round and reactive to light no discharge without nystagmus Neck.  Cervical collar in place Cardiac regular rate and rhythm without any extra sounds or murmur heard Abdomen.  Soft nontender positive bowel sounds without rebound Respiratory effort normal no respiratory distress without wheeze Cardiac regular rate and rhythm without any extra sounds or murmur heard Abdomen.  Soft nontender positive bowel sounds without rebound Skin.  Intact Neurologic.  Alert oriented x3.  Right upper extremity 4/5 left upper extremity 3/5, 4/5 in lower extremities bilaterally  He/She  has had improvement in activity tolerance, balance, postural control as well as ability to compensate for deficits. He/She has had improvement in functional use RUE/LUE  and RLE/LLE as well as improvement in awareness.  Patient ambulating 125 feet x 2 rolling walker contact-guard assist.  Sit to stand without assistive device high level independent ADLs dynamic standing balance patient donned shoes with minimal assist for tying them complete sit to stand transfer rolling walker modified independent.  Supervision and engages in bed making tasks without rolling walker.   Patient able to retrieve pillowcase with contact-guard from dresser was provided sheet and blanket with set up.  Full family teaching completed plan discharged home contact-guard.  She can increase her ambulation up to 160 feet.       Disposition: Discharged home    Diet: Regular  Special Instructions: No driving smoking or alcohol  Cervical collar as directed  Medications at discharge. 1.  Tylenol as needed 2.Apremilast 30 mg twice daily 3.  Dulcolax suppository daily as needed 4.  Flexeril 10 mg p.o. 3 times daily as needed 5.  Voltaren gel 4 g 4 times a day to affected area 6.  Colace 100 mg p.o. twice daily 7.  Neurontin 100 mg p.o. twice daily 8.  Lovaza 1 mg p.o. twice daily 9.  Oxycodone 10 mg every 4 hours as needed pain 10.  MiraLAX daily  30-35 minutes were spent completing discharge summary and discharge planning      Follow-up Information     Lovorn, Jinny Blossom, MD Follow up.   Specialty: Physical Medicine and Rehabilitation Why: Office to call  for appointment Contact information: [address] [place] [address] [phone]         Vallarie Mare, MD Follow up.   Specialty: Neurosurgery Why: Call for appointment Contact information: [address] [place] [address] [phone]                 Signed: Cathlyn Parsons 02/24/2022, 5:36 AM

## 2022-02-23 LAB — CBC WITH DIFFERENTIAL/PLATELET
Abs Immature Granulocytes: 0.12 10*3/uL — ABNORMAL HIGH (ref 0.00–0.07)
Basophils Absolute: 0.1 10*3/uL (ref 0.0–0.1)
Basophils Relative: 1 %
Eosinophils Absolute: 0.4 10*3/uL (ref 0.0–0.5)
Eosinophils Relative: 6 %
HCT: 30.7 % — ABNORMAL LOW (ref 36.0–46.0)
Hemoglobin: 9.8 g/dL — ABNORMAL LOW (ref 12.0–15.0)
Immature Granulocytes: 2 %
Lymphocytes Relative: 26 %
Lymphs Abs: 1.7 10*3/uL (ref 0.7–4.0)
MCH: 27.8 pg (ref 26.0–34.0)
MCHC: 31.9 g/dL (ref 30.0–36.0)
MCV: 87.2 fL (ref 80.0–100.0)
Monocytes Absolute: 0.7 10*3/uL (ref 0.1–1.0)
Monocytes Relative: 10 %
Neutro Abs: 3.5 10*3/uL (ref 1.7–7.7)
Neutrophils Relative %: 55 %
Platelets: 390 10*3/uL (ref 150–400)
RBC: 3.52 MIL/uL — ABNORMAL LOW (ref 3.87–5.11)
RDW: 14.6 % (ref 11.5–15.5)
WBC: 6.4 10*3/uL (ref 4.0–10.5)
nRBC: 0 % (ref 0.0–0.2)

## 2022-02-23 LAB — COMPREHENSIVE METABOLIC PANEL
ALT: 13 U/L (ref 0–44)
AST: 15 U/L (ref 15–41)
Albumin: 2.5 g/dL — ABNORMAL LOW (ref 3.5–5.0)
Alkaline Phosphatase: 50 U/L (ref 38–126)
Anion gap: 9 (ref 5–15)
BUN: <5 mg/dL — ABNORMAL LOW (ref 8–23)
CO2: 26 mmol/L (ref 22–32)
Calcium: 9.2 mg/dL (ref 8.9–10.3)
Chloride: 102 mmol/L (ref 98–111)
Creatinine, Ser: 0.85 mg/dL (ref 0.44–1.00)
GFR, Estimated: >60 mL/min (ref >60)
Glucose, Bld: 131 mg/dL — ABNORMAL HIGH (ref 70–99)
Potassium: 3.8 mmol/L (ref 3.5–5.1)
Sodium: 137 mmol/L (ref 135–145)
Total Bilirubin: 0.4 mg/dL (ref 0.3–1.2)
Total Protein: 6 g/dL — ABNORMAL LOW (ref 6.5–8.1)

## 2022-02-23 MED ORDER — ENOXAPARIN SODIUM 40 MG/0.4ML IJ SOSY
40.0000 mg | PREFILLED_SYRINGE | INTRAMUSCULAR | Status: DC
  Administered 2022-02-24: 40 mg via SUBCUTANEOUS
  Filled 2022-02-23 (×2): qty 0.4

## 2022-02-23 NOTE — Progress Notes (Signed)
Occupational Therapy Session Note  Patient Details  Name: Zanita Millman MRN: [id number] Date of Birth: 04/21/1946  Today's Date: 02/23/2022 Session 1: OT Individual Time: 2094-7096 OT Individual Time Calculation (min): 45 min   Session 2: OT individual Time: 1300-1400 OT individual time Total: 60 min.   Short Term Goals: Week 2:  OT Short Term Goal 1 (Week 2): STG = LTG (due to ELOS)  Skilled Therapeutic Interventions/Progress Updates:    Session 1:  S:  Pt requesting to wait to shower till tomorrow. Would like to wash up at the sink.     O: Patient participated in skilled OT session focusing on ADL re-training, functional transfers/ambulation using RW, and activity tolerance. Therapist facilitated good safety awareness and judgement during ADL re-training in order to integrate this skill when at home with Daughter providing assist. Patient required assist with managing shirt around Cervical collar. Pt reports that Dr. Dagoberto Ligas removed the gauze from her neck incision and she only has steristrips now. She is wondering if she still needs to cover her steri strips when in the shower.  Will follow up with Dr. Dagoberto Ligas with question.   P: Family ed session scheduled for this afternoon with Daughter, [name].   Session 2: S: Patient, Daughter Veterinary surgeon) and SIL present during session for family training.   Patient and family participated in skilled OT Family Education session focusing on educating patient and family on patient's functional performance when completing bathing, dressing, toileting, functional transfers. Provided example of Long handled reacher, long handled shoe horn, and sock aid and discussed benefit of using each item to assist with lower body dressing by decreasing the need to bend over and/or assist with limited joint mobility that may be present. Patient completed simulated walk-in shower transfer using a backwards step in method. Discussed placement of grab bars and safety  techniques when walking in and out in order to decrease risk of falls. Daughter confirmed they have purchased an adjustable bed, shower chair with back and bilateral handles, and a walker basket. An ETB and RW will be ordered. All education completed and questions were answered.  With time remaining, therapist integrated manual therapy, passive stretching, and AA/ROM with RUE into session. Pt in a semi reclined position in her recliner during exercises.  Manual techniques completed to address fascial restrictions in the right upper trapezius region. Patient presents with moderate fascial restrictions in the upper trapezius. P/ROM completed to the right shoulder: flexion, scaption, IR/er, horizontal abduction/adduction, 10X. Pt completed AA/ROM with 1# dowel rod with additional 1 finger assist from therapist on the right side to complete protraction and flexion; 10X Pt completed A/ROM IR/er 10X.    P: Provide shoulder HEP. Complete grad day tomorrow.   Therapy Documentation Precautions:  Precautions Precautions: Fall, Cervical Required Braces or Orthoses: Cervical Brace Cervical Brace: Hard collar, At all times Restrictions Weight Bearing Restrictions: No  Pain:  0/10 pain   Therapy/Group: Individual Therapy  Ailene Ravel, OTR/L,CBIS  Supplemental OT - [facility]  02/23/2022, 7:50 AM

## 2022-02-23 NOTE — Progress Notes (Signed)
Occupational Therapy Discharge Summary  Patient Details  Name: Shelby Fleming MRN: [id number] Date of Birth: May 01, 1946  {CHL IP REHAB OT TIME CALCULATIONS:304400400}   Patient has met {NUMBERS 0-12:18577} of {NUMBERS 0-12:18577} long term goals due to improved activity tolerance, improved balance, functional use of  RIGHT upper extremity, improved awareness, and improved coordination.  Patient to discharge at overall {LOA:3049010} level.  Patient's care partner is independent to provide the necessary physical assistance at discharge.    Reasons goals not met: ***  Recommendation:  Patient will benefit from ongoing skilled OT services in home health setting to continue to advance functional skills in the area of BADL and iADL.  Equipment: BSC  Reasons for discharge: treatment goals met and discharge from hospital  Patient/family agrees with progress made and goals achieved: Yes  OT Discharge Precautions/Restrictions    General   Vital Signs   Pain   ADL ADL Equipment Provided: Long-handled sponge Eating: Set up Where Assessed-Eating: Wheelchair Grooming: Supervision/safety Where Assessed-Grooming: Sitting at sink Upper Body Bathing: Setup Where Assessed-Upper Body Bathing: Shower Lower Body Bathing: Supervision/safety Where Assessed-Lower Body Bathing: Shower Upper Body Dressing: Moderate assistance (assist required with cervical collar and right arm) Where Assessed-Upper Body Dressing: Standing at sink Lower Body Dressing: Minimal assistance (shoes) Where Assessed-Lower Body Dressing: Wheelchair Toileting: Minimal assistance Where Assessed-Toileting: Glass blower/designer: Close Engineer, maintenance (IT) Method: Stand pivot, Counselling psychologist: Bedside commode, Energy manager: Close supervision Social research officer, government Method: Heritage manager: Radio broadcast assistant, Product/process development scientist     Trunk/Postural Assessment     Balance   Extremity/Trunk Assessment       Therapist, nutritional, OTR/L,CBIS  Supplemental OT - [facility]  02/23/2022, 4:00 PM

## 2022-02-23 NOTE — Progress Notes (Signed)
Patient is A&O x 4 and able to make her needs known. Family education completed. Patient daughter and son in law educated on medications Patients family had questions regarding her buttock and if patient has an open wound. Patient has a scab on the inner right buttock. Scab washed and dried, new foam lift dressing applied. Daughter understands maintenance on buttock, and to monitor for any open areas, or redness when discharged home. Reposition every 2 hours and PRN.

## 2022-02-23 NOTE — Progress Notes (Signed)
Patient ID: Shelby Fleming, female   DOB: 1946-05-20, 76 y.o.   MRN: [id number]  SW met with pt and pt family during family edu. Pt dtr confirms that they purchased her an adjustable bed which is lower to the ground.   [name], MSW, [place] Office: [phone] Cell: [phone] Fax: [phone]

## 2022-02-23 NOTE — Progress Notes (Signed)
Physical Therapy Session Note  Patient Details  Name: Shelby Fleming MRN: [id number] Date of Birth: 1946/07/28  Today's Date: 02/23/2022 PT Individual Time: 0800-0830; 1420-1530 PT Individual Time Calculation (min): 30 min and 70 min  Short Term Goals: Week 2:  PT Short Term Goal 1 (Week 2): =LTG due to ELOS  Skilled Therapeutic Interventions/Progress Updates:    Session 1: Pt received seated in w/c in room, agreeable to PT session. Pt reports ongoing soreness in R bicep/delt region, not rated and declines intervention. Assisted pt with donning knee-high TEDs for edema management. Sit to stand and transfers with RW at Supervision level during session. Ambulation 2 x 300 ft with RW and Supervision for endurance training in more distracting, functional environment. Pt does require cues to attend to safety and RW during gait in distracting environment. Pt returned to w/c and left seated with BLE elevated, needs in reach at end of session.  Session 2: Pt received seated in recliner in room with daughter and son-in-law Veterinary surgeon and [name]) present for family education. Pt with no complaints of pain. Pt performs sit to stand transfers with Supervision and RW during session. Ambulation up to 300 ft with RW at Supervision level. Trial gait with no AD x 90 ft with CGA with decreased gait speed noted. Car transfer with RW and Supervision with increased time needed for LE management in/out of car. Ascend/descend 8 x 6" stairs with L handrail per home setup with CGA. Pt's family able to provide return demonstration of assisting pt with mobility and stairs. Pt's family reports she will have an adjustable bed at home with a bedrail. Also discussed pt's LE edema and use of TED hose for edema management. Pt's daughter reports understanding of how to ACE wrap limbs if TEDs are not enough to control edema. Reviewed cervical precautions and family states understanding of how to don/doff hard collar. Ambulation through  obstacle course stepping up/down 6" step, standing on airex x 30 sec with no UE support and CGA, stepping over objects, and alt L/R cone taps overall with RW and CGA. Pt left seated in recliner in room with needs in reach, family present at end of session.  Therapy Documentation Precautions:  Precautions Precautions: Fall, Cervical Required Braces or Orthoses: Cervical Brace Cervical Brace: Hard collar, At all times Restrictions Weight Bearing Restrictions: No       Therapy/Group: Individual Therapy   Excell Seltzer, PT, DPT, CSRS 02/23/2022, 9:21 AM

## 2022-02-23 NOTE — Progress Notes (Signed)
Physical Therapy Discharge Summary  Patient Details  Name: Shelby Fleming MRN: [id number] Date of Birth: 23-Nov-1945  {CHL IP REHAB PT TIME CALCULATION:304800500}   Patient has met {NUMBERS 0-12:18577} of 10 long term goals due to improved activity tolerance, improved balance, improved postural control, increased strength, decreased pain, and ability to compensate for deficits.  Patient to discharge at an ambulatory level {LOA:3049010}.   Patient's care partner is independent to provide the necessary physical assistance at discharge. Pt's daughter [name] and son-in-law [name] have completed hands-on family education and are safe to assist pt upon d/c home.  Reasons goals not met: ***  Recommendation:  Patient will benefit from ongoing skilled PT services in outpatient setting to continue to advance safe functional mobility, address ongoing impairments in endurance, strength, safety, balance, independence with functional mobility, and minimize fall risk.  Equipment: RW  Reasons for discharge: treatment goals met and discharge from hospital  Patient/family agrees with progress made and goals achieved: Yes  PT Discharge Precautions/Restrictions   Vital Signs   Pain   Pain Interference   Vision/Perception     Cognition   Sensation   Motor     Mobility   Locomotion     Trunk/Postural Assessment     Balance   Extremity Assessment             Excell Seltzer Excell Seltzer, PT, DPT, CSRS 02/23/2022, 3:41 PM

## 2022-02-23 NOTE — Progress Notes (Signed)
PROGRESS NOTE   Subjective/Complaints:   Pt reports still having LE swelling- Pain is doing well- has soreness in R upper arm, but not actual pain.  LBM yesterday.  Peeing well- bladder scans >50cc  ROS:   Pt denies SOB, abd pain, CP, N/V/C/D, and vision changes   Objective:   No results found. Recent Labs    02/23/22 0515  WBC 6.4  HGB 9.8*  HCT 30.7*  PLT 390    Recent Labs    02/23/22 0515  NA 137  K 3.8  CL 102  CO2 26  GLUCOSE 131*  BUN <5*  CREATININE 0.85  CALCIUM 9.2    Intake/Output Summary (Last 24 hours) at 02/23/2022 1231 Last data filed at 02/23/2022 0700 Gross per 24 hour  Intake 360 ml  Output --  Net 360 ml         Physical Exam: Vital Signs Blood pressure 119/63, pulse 92, temperature 97.6 F (36.4 C), temperature source Oral, resp. rate 18, height '5\' 4"'$  (1.626 m), weight 87.2 kg, SpO2 95 %.       General: awake, alert, appropriate, sitting up in bedside chair; daughter in room; NAD HENT: conjugate gaze; oropharynx moist- cervical collar in place- steristrips on anterior neck incision CV: regular rate; no JVD Pulmonary: CTA B/L; no W/R/R- good air movement GI: soft, NT, ND, (+)BS Psychiatric: appropriate Neurological: Ox3  Extremities: No clubbing, cyanosis, LE swelling to mid calf B/L 1+ Pulses are 2+ Psych: very pleasant Skin: Incision site is clean dry and intact does have areas of psoriasis. Warm and dry otherwise Note: 1) Patient does have red moist macerated area under her bilateral breasts, which is improving. She was seen by [facility] nurse with recommendations for silver impregnated fabric to be used for 5 days at a time.   2) patient also has edema nonpitting bilateral ankles, medial malleolus and lateral maleollus, LE edema appears improved from several days ago  Has TED hose and ACE wrap in place, during day. Neuro: Patient is alert.  Oriented x3 and follows  commands well Musculoskeletal: She has hard cervical collar in place, arthritis of small hand joints.  RUE 4/5, LUE 3/5, 4/5 in lower extremities bilaterally.  Assessment/Plan: 1. Functional deficits which require 3+ hours per day of interdisciplinary therapy in a comprehensive inpatient rehab setting. Physiatrist is providing close team supervision and 24 hour management of active medical problems listed below. Physiatrist and rehab team continue to assess barriers to discharge/monitor patient progress toward functional and medical goals  Care Tool:  Bathing    Body parts bathed by patient: Right arm, Left arm, Chest, Abdomen, Front perineal area, Right upper leg, Left upper leg, Face   Body parts bathed by helper: Left lower leg, Right lower leg, Buttocks     Bathing assist Assist Level: Moderate Assistance - Patient 50 - 74%     Upper Body Dressing/Undressing Upper body dressing   What is the patient wearing?: Bra, Pull over shirt, Orthosis (cervical collar in place) Orthosis activity level: Performed by helper  Upper body assist Assist Level: Moderate Assistance - Patient 50 - 74%    Lower Body Dressing/Undressing Lower body dressing  What is the patient wearing?: Underwear/pull up, Pants     Lower body assist Assist for lower body dressing: Moderate Assistance - Patient 50 - 74%     Toileting Toileting    Toileting assist Assist for toileting: Maximal Assistance - Patient 25 - 49%     Transfers Chair/bed transfer  Transfers assist     Chair/bed transfer assist level: Supervision/Verbal cueing     Locomotion Ambulation   Ambulation assist      Assist level: Minimal Assistance - Patient > 75% Assistive device: No Device Max distance: 173f   Walk 10 feet activity   Assist     Assist level: Minimal Assistance - Patient > 75% Assistive device: No Device   Walk 50 feet activity   Assist Walk 50 feet with 2 turns activity did not occur:  Safety/medical concerns  Assist level: Minimal Assistance - Patient > 75% Assistive device: No Device    Walk 150 feet activity   Assist Walk 150 feet activity did not occur: Safety/medical concerns  Assist level: Minimal Assistance - Patient > 75% Assistive device: No Device    Walk 10 feet on uneven surface  activity   Assist Walk 10 feet on uneven surfaces activity did not occur: Safety/medical concerns         Wheelchair     Assist Is the patient using a wheelchair?: Yes Type of Wheelchair: Manual    Wheelchair assist level: Dependent - Patient 0%      Wheelchair 50 feet with 2 turns activity    Assist        Assist Level: Dependent - Patient 0%   Wheelchair 150 feet activity     Assist      Assist Level: Dependent - Patient 0%   Blood pressure 119/63, pulse 92, temperature 97.6 F (36.4 C), temperature source Oral, resp. rate 18, height '5\' 4"'$  (1.626 m), weight 87.2 kg, SpO2 95 %.    Medical Problem List and Plan: 1. 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  Cervical collar as directed             -patient may shower             -ELOS/Goals: 5-7 days S  -She would likely benefit from hospital bed  -Discharge date expected 6/21, min A to Mod I  -Continue CIR- PT, OT - d/c Wednesday-  2.  Antithrombotics: -DVT/anticoagulation:  Pharmaceutical: Lovenox check vascular study  6/13- has superficial Venous thrombosis very low down- will not treat- per CHEST guidelines             -antiplatelet therapy: N/A 3. Postoperative pain: continue Tramadol 50 mg every 6 hours, Neurontin 100 mg twice daily, Flexeril 10 mg 3 times daily as needed, oxycodone as needed             6/8- pain controlled with scheduled tramadol  6/12- pain controlled, but still  bothersome in R upper arm still  6/19- pain controlled- con't regimen 4. Mood: Provide emotional support             -antipsychotic agents: N/A 5. Neuropsych: This patient is capable of making decisions on her own behalf. 6. Skin/Wound Care: Routine skin checks 7. Fluids/Electrolytes/Nutrition: Routine in and outs with follow-up chemistries 8.  Neurogenic bowel and bladder.  Check PVR.  Patient had initially refused intermittent catheterization.  Establish bowel program             6/8- will order bladder scans to see if retaining still- not clear if needs bowel program quite yet.              6/9- is voiding- will con't to monitor to see if needs caths 6/11 Patient is voiding okay without needing caths  6/12- LBM yesterday small- bladder scans <100cc- con'to monitor  6/15 BM yesterday, bladder scans 0-128m  6/16 further bladder scan yesterday <100  6/18 Appears to be voiding well without need for caths, BM yesterday  6/19- LBM yesterday- no bowel program needed- also bladder scans <50cc- will d/c scans 9.  Dysphagia.  Improved.  Diet advanced to regular 10.  Hypertension.  Patient on lisinopril 40 mg daily prior to admission.  Resume as needed             6/8- BP well controlled off Lisinopril  6/18 BP well controlled overall, continue to follow    02/23/2022    4:37 AM 02/22/2022    7:22 PM 02/22/2022    1:48 PM  Vitals with BMI  Systolic [id number] Diastolic 63 52 55  Pulse 92 92 91    11.  Hyperlipidemia.  Restart Lovaza 2 g twice daily 12.  Psoriasis.  Restart Oteleza 30 mg twice daily             6/8- will send Otezla and cream to pharmacy so can use.  6/11 patient to continue Oteleza 30 mg BID 13. 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 Hypokalemia             6/8- K+ 2.9- will give KCL 40 mEq x3 and recheck in AM- was last 3.7             6/9- K+ 4.2 this AM- will recheck Monday            6/12- K+ 3.5- will put on low daily dose of KCL 10 mEq daily and recheck  Thursday  6/15- K+ 3.6 today, continue low dose K+  Labs tomorrow  6/19- K+ 3.8- con' regimen 15. MASD under breasts             6/9- will consult WOC for recommendations- is severe. With open skin 16. Constipation             6/9- Will order sorbitol after therapy.  6/10 Patient's last BM was today sorbitol worked well.  6/19- LBM yesterday 17. Moderate malnutrition             6/9- will add Ensure BID between meals 18. Dry mouth             6/9- doesn't have thrush on exam- will order Biotene mouthwash   6/13- Mouth less dry with Biotene 19.  Bilateral lower ankle edema 6/10 patient says that she has had edema that started on Thursday.  She says that it is mostly in the ankles wonders if it could be due to her psoriatic arthritis.  Some of the swelling on the anterior right ankle has subsided overall there is greater edema in the left ankle versus right there is no edema of the legs and +1 nonpitting edema the dorsal surface of both feet.  Edema is +2 nonpitting for the ankles.  Encourage use of elevation for suspected dependent edema if this does not work may also add on use of Ace wrap. 6/11 PT placed TED  hose (ankle high) on pt then placed ACE wrap over. Swelling of lower legs and ankles less today than yesterday.  Continue elevation of legs with pillows.   6/12- LE edema looking better- con't regimen  6/18 continue TED and leg elevation  6/19- likely due to mild LE weakness from central cord syndrome- needs to wear TEDs.      LOS: 12 days A FACE TO FACE EVALUATION WAS PERFORMED  Janace Decker 02/23/2022, 12:31 PM

## 2022-02-24 MED ORDER — GABAPENTIN 100 MG PO CAPS: 100.0000 mg | ORAL_CAPSULE | Freq: Two times a day (BID) | ORAL | 0 refills | Status: DC

## 2022-02-24 MED ORDER — OXYCODONE HCL 10 MG PO TABS
10.0000 mg | ORAL_TABLET | ORAL | 0 refills | Status: DC | PRN
Start: 2022-02-24 — End: 2022-07-10

## 2022-02-24 MED ORDER — ACETAMINOPHEN 325 MG PO TABS
650.0000 mg | ORAL_TABLET | ORAL | Status: DC | PRN
Start: 2022-02-24 — End: 2023-01-22

## 2022-02-24 MED ORDER — LORATADINE 10 MG PO TABS
10.0000 mg | ORAL_TABLET | Freq: Every day | ORAL | 0 refills | Status: AC
Start: 2022-02-24 — End: ?

## 2022-02-24 MED ORDER — OMEGA-3-ACID ETHYL ESTERS 1 G PO CAPS: 2.0000 g | ORAL_CAPSULE | Freq: Two times a day (BID) | ORAL | 0 refills | Status: AC

## 2022-02-24 MED ORDER — D 1000 25 MCG (1000 UT) PO CAPS
1000.0000 [IU] | ORAL_CAPSULE | Freq: Every day | ORAL | 0 refills | Status: AC
Start: 2022-02-24 — End: ?

## 2022-02-24 MED ORDER — CYCLOBENZAPRINE HCL 10 MG PO TABS: 10.0000 mg | ORAL_TABLET | Freq: Three times a day (TID) | ORAL | 0 refills | Status: DC | PRN

## 2022-02-24 MED ORDER — DOCUSATE SODIUM 100 MG PO CAPS: 100.0000 mg | ORAL_CAPSULE | Freq: Two times a day (BID) | ORAL | 0 refills | Status: AC

## 2022-02-24 MED ORDER — DICLOFENAC SODIUM 1 % EX GEL
4.0000 g | Freq: Four times a day (QID) | CUTANEOUS | 0 refills | Status: DC | PRN
Start: 2022-02-24 — End: 2022-07-10

## 2022-02-24 MED ORDER — POLYETHYLENE GLYCOL 3350 17 G PO PACK: 17.0000 g | PACK | Freq: Every day | ORAL | 0 refills | Status: AC

## 2022-02-24 NOTE — Patient Care Conference (Addendum)
Inpatient RehabilitationTeam Conference and Plan of Care Update Date: 02/24/2022   Time: 11:00 AM    Patient Name: Shelby Fleming      Medical Record Number: [id number]  Date of Birth: [birth year] Sex: Female         Room/Bed: 4M01C/4M01C-01 Payor Info: Payor: [employer] park [employer] MEDICARE / Plan: [facility] MEDICARE / Product Type: *No Product type* /    Admit Date/Time:  02/11/2022  5:38 PM  Primary Diagnosis:  Central cord syndrome [facility])  Hospital Problems: Principal Problem:   Central cord syndrome [facility]) Active Problems:   Pressure injury of skin    Expected Discharge Date: Expected Discharge Date: 02/25/22  Team Members Present: Physician leading conference: Dr. Courtney Heys Social Worker Present: [name], [place] Nurse Present: Dorthula Nettles, RN PT Present: Excell Seltzer, PT OT Present: Roanna Epley, Rudy, OT SLP Present: Lillie Columbia, SLP PPS Coordinator present : Gunnar Fusi, SLP     Current Status/Progress Goal Weekly Team Focus  Bowel/Bladder   Continent B&B. last BM-6/18  remain cont.  assess q shift and PRN.continue to offer to use bathroom.   Swallow/Nutrition/ Hydration             ADL's   UB and LB bathing: SBA with long handled sponge, UB dressing: Min A, LB dressing: SBA when using reacher and sock aid. Toileting: SBA, Functional transfers: SBA with RW.  bathing-min A; LB dressing-min A; toileting/toilet tranfsers-supervision  BADL re-training, patient/family education (completed on 6/19), RUE strength   Mobility   Supervision bed mobility, transfers wiht RW, gait up to 300 ft with RW, CGA stairs with L handrail  Supervision overall, CGA stairs  family edu, d/c planning   Communication             Safety/Cognition/ Behavioral Observations            Pain   pain managed with schedule tramadol, effective.  pain<3  assess pain q shift and PRN   Skin   insicion to left anterior neck. steri strips on.  remain free of new breakdown  assess  skin q shift and PRN     Discharge Planning:  d/c to home and dtr will provide 24/7 care to her mother. Fam edu completed on 6/19 1pm-4pm with pt dtr.   Team Discussion: Discontinued bladder scans. Not eating well. Perseverates on +1 edema to BLE. Needs to be on Lovenox or Eliquis  for total 8 weeks. Rash under breast healing. No Oxy IR given in previous 2 days. Continent B/B, Tramadol scheduled. Cervical incision covered with steri strips. Family education yesterday.  Patient on target to meet rehab goals: yes, supervision overall. Ready for discharge.  *See Care Plan and progress notes for long and short-term goals.   Revisions to Treatment Plan:  Finalizing discharge plans.   Teaching Needs: Family education completed.   Current Barriers to Discharge: No current barriers  Possible Resolutions to Barriers: All barriers addressed     Medical Summary Current Status: will d/w pt using lovneox x total 8 weeks- continent- scheduled tramadol- oxy not using-stopped bladder scans- continent  Barriers to Discharge: Weight bearing restrictions;Home enviroment access/layout;Neurogenic Bowel & Bladder  Barriers to Discharge Comments: supervision level- family ed went great Possible Resolutions to [employer] Focus: d/c from SLP- d/c tmorrow-   Continued Need for Acute Rehabilitation Level of Care: The patient requires daily medical management by a physician with specialized training in physical medicine and rehabilitation for the following reasons: Direction of a multidisciplinary physical rehabilitation program  to maximize functional independence : Yes Medical management of patient stability for increased activity during participation in an intensive rehabilitation regime.: Yes Analysis of laboratory values and/or radiology reports with any subsequent need for medication adjustment and/or medical intervention. : Yes   I attest that I was present, lead the team conference, and concur  with the assessment and plan of the team.   Dorthula Nettles G 02/24/2022, 2:30 PM

## 2022-02-24 NOTE — Plan of Care (Signed)
  Problem: RH Functional Use of Upper Extremity Goal: LTG Patient will use RT/LT upper extremity as a (OT) Description: LTG: Patient will use right/left upper extremity as a stabilizer/gross assist/diminished/nondominant/dominant level with assist, with/without cues during functional activity (OT) Outcome: Not Met (add Reason) Note: Patient continues to have limited A/ROM of her RUE with decreased strength. Unable to utilize her RUE as her dominant extremity when involving reaching at or above shoulder level.

## 2022-02-24 NOTE — Progress Notes (Signed)
Patient ID: Shelby Fleming, female   DOB: 07-03-1946, 76 y.o.   MRN: [id number]  SW met with pt in room to provide updates from team conference, and d/c date remains tomorrow. Pt prefers [facility] @ [facility] for therapies due to being closer towards her home.   Crisp spoke with tp dtr [name] to inform on d/c recs- outpatient PT/OT and DME recs RW and 3in1 BSC. SW informed will order and have items delivered to the room. No questions/concerns reported.   SW ordered DME with [employer] via parachute.   SW faxed outpatient PT/OT referral to [facility] (p:[phone]/f:[phone]).   [name], MSW, [place] Office: [phone] Cell: [phone] Fax: [phone]

## 2022-02-24 NOTE — Progress Notes (Signed)
Inpatient Rehabilitation Discharge Medication Review by a Pharmacist  A complete drug regimen review was completed for this patient to identify any potential clinically significant medication issues.  High Risk Drug Classes Is patient taking? Indication by Medication  Antipsychotic No   Anticoagulant Yes Apixaban- VTE prophylaxis in SCI  Antibiotic No   Opioid Yes Oxycodone: PRN pain  Antiplatelet No   Hypoglycemics/insulin No   Vasoactive Medication No   Chemotherapy No   Other Yes Docusate, Miralax: constipation Diclofenac gel: PRN topical pain relief Cyclobenzaprine: PRN muscle spasms Gabapentin: neuropathy Otezla: Psoriatic arthritis Lovaza: HLD Claritin- seasonal allergies Vitamin D- supplement     Type of Medication Issue Identified Description of Issue Recommendation(s)  Drug Interaction(s) (clinically significant)     Duplicate Therapy     Allergy     No Medication Administration End Date     Incorrect Dose     Additional Drug Therapy Needed     Significant med changes from prior encounter (inform family/care partners about these prior to discharge).    Other       Clinically significant medication issues were identified that warrant physician communication and completion of prescribed/recommended actions by midnight of the next day:  No  Time spent performing this drug regimen review (minutes): 30   Thank you for allowing pharmacy to be a part of this patient's care.  Vaughan Basta BS, PharmD, BCPS Clinical Pharmacist 02/24/2022 11:59 AM  Contact: [phone] after 3 PM  "Be curious, not judgmental..." -Jamal Maes

## 2022-02-24 NOTE — Progress Notes (Signed)
PROGRESS NOTE   Subjective/Complaints:   Bladder scans <50cc- so stopped bladder scans.  No appetite this AM- ate <10% of tray- ate 50% of dinner tray.  LBM overnight.   ROS:   Pt denies SOB, abd pain, CP, N/V/C/D, and vision changes   Objective:   No results found. Recent Labs    02/23/22 0515  WBC 6.4  HGB 9.8*  HCT 30.7*  PLT 390    Recent Labs    02/23/22 0515  NA 137  K 3.8  CL 102  CO2 26  GLUCOSE 131*  BUN <5*  CREATININE 0.85  CALCIUM 9.2    Intake/Output Summary (Last 24 hours) at 02/24/2022 0819 Last data filed at 02/24/2022 0700 Gross per 24 hour  Intake 480 ml  Output --  Net 480 ml         Physical Exam: Vital Signs Blood pressure (!) 115/51, pulse 90, temperature 98.2 F (36.8 C), temperature source Oral, resp. rate 17, height '5\' 4"'$  (1.626 m), weight 87.2 kg, SpO2 95 %.        General: awake, alert, appropriate, sitting up in bedside chair; <10% tray eaten;  NAD HENT: conjugate gaze; oropharynx moist- cervical collar in place- no sutures seen to be removed- has steristrips in place CV: regular rate; no JVD Pulmonary: CTA B/L; no W/R/R- good air movement GI: soft, NT, ND, (+)BS Psychiatric: appropriate Neurological: Ox3 Extremities: No clubbing, cyanosis, LE swelling to mid calf B/L 1+ Pulses are 2+ Psych: very pleasant Skin: Incision site is clean dry and intact does have areas of psoriasis. Warm and dry otherwise Note: 1) Patient does have red moist macerated area under her bilateral breasts, which is improving. She was seen by [facility] nurse with recommendations for silver impregnated fabric to be used for 5 days at a time.   2) patient also has edema nonpitting bilateral ankles, medial malleolus and lateral maleollus, LE edema appears improved from several days ago  Has TED hose and ACE wrap in place, during day. Neuro: Patient is alert.  Oriented x3 and follows commands  well Musculoskeletal: She has hard cervical collar in place, arthritis of small hand joints.  RUE 4/5, LUE 3/5, 4/5 in lower extremities bilaterally.  Assessment/Plan: 1. Functional deficits which require 3+ hours per day of interdisciplinary therapy in a comprehensive inpatient rehab setting. Physiatrist is providing close team supervision and 24 hour management of active medical problems listed below. Physiatrist and rehab team continue to assess barriers to discharge/monitor patient progress toward functional and medical goals  Care Tool:  Bathing    Body parts bathed by patient: Right arm, Left arm, Chest, Abdomen, Front perineal area, Buttocks, Right upper leg, Left upper leg, Right lower leg, Left lower leg, Face   Body parts bathed by helper: Left lower leg, Right lower leg, Buttocks     Bathing assist Assist Level: Set up assist     Upper Body Dressing/Undressing Upper body dressing   What is the patient wearing?: Bra, Pull over shirt, Orthosis Orthosis activity level: Performed by helper  Upper body assist Assist Level: Minimal Assistance - Patient > 75%    Lower Body Dressing/Undressing Lower body dressing  What is the patient wearing?: Underwear/pull up, Pants     Lower body assist Assist for lower body dressing: Supervision/Verbal cueing     Toileting Toileting    Toileting assist Assist for toileting: Maximal Assistance - Patient 25 - 49%     Transfers Chair/bed transfer  Transfers assist     Chair/bed transfer assist level: Supervision/Verbal cueing     Locomotion Ambulation   Ambulation assist      Assist level: Supervision/Verbal cueing Assistive device: Walker-rolling Max distance: 300'   Walk 10 feet activity   Assist     Assist level: Supervision/Verbal cueing Assistive device: Walker-rolling   Walk 50 feet activity   Assist Walk 50 feet with 2 turns activity did not occur: Safety/medical concerns  Assist level:  Supervision/Verbal cueing Assistive device: Walker-rolling    Walk 150 feet activity   Assist Walk 150 feet activity did not occur: Safety/medical concerns  Assist level: Supervision/Verbal cueing Assistive device: Walker-rolling    Walk 10 feet on uneven surface  activity   Assist Walk 10 feet on uneven surfaces activity did not occur: Safety/medical concerns         Wheelchair     Assist Is the patient using a wheelchair?: Yes Type of Wheelchair: Manual    Wheelchair assist level: Dependent - Patient 0%      Wheelchair 50 feet with 2 turns activity    Assist        Assist Level: Dependent - Patient 0%   Wheelchair 150 feet activity     Assist      Assist Level: Dependent - Patient 0%   Blood pressure (!) 115/51, pulse 90, temperature 98.2 F (36.8 C), temperature source Oral, resp. rate 17, height '5\' 4"'$  (1.626 m), weight 87.2 kg, SpO2 95 %.    Medical Problem List and Plan: 1. 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  Cervical collar as directed             -patient may shower             -ELOS/Goals: 5-7 days S  -She would likely benefit from hospital bed  -Discharge date expected 6/21, min A to Mod I  -Con't CIR= PT and OT- team conference today to finalize d/c  2.  Antithrombotics: -DVT/anticoagulation:  Pharmaceutical: Lovenox check vascular study  6/13- has superficial Venous thrombosis very low down- will not treat- per CHEST guidelines  6/20- changed from tx dose lovenox to daily 40 mg daily- CHEST guidelines suggest 2 months of Lovenox even though walking- will d/w pt and see if she is willing to do [facility]?             -antiplatelet therapy: N/A 3. Postoperative pain: continue Tramadol 50 mg every 6 hours, Neurontin 100 mg twice daily,  Flexeril 10 mg 3 times daily as needed, oxycodone as needed            6/20- hasn't been receiving Oxycodone for at least 2 days- con't on nonopiate meds 4. Mood: Provide emotional support             -antipsychotic agents: N/A 5. Neuropsych: This patient is capable of making decisions on her own behalf. 6. Skin/Wound Care: Routine skin checks 7. Fluids/Electrolytes/Nutrition: Routine in and outs with follow-up chemistries 8.  Neurogenic bowel and bladder.  Check PVR.  Patient had initially refused  intermittent catheterization.  Establish bowel program             6/8- will order bladder scans to see if retaining still- not clear if needs bowel program quite yet.              6/9- is voiding- will con't to monitor to see if needs caths 6/11 Patient is voiding okay without needing caths  6/12- LBM yesterday small- bladder scans <100cc- con'to monitor  6/15 BM yesterday, bladder scans 0-121m  6/16 further bladder scan yesterday <100  6/18 Appears to be voiding well without need for caths, BM yesterday  6/19- LBM yesterday- no bowel program needed- also bladder scans <50cc- will d/c scans 9.  Dysphagia.  Improved.  Diet advanced to regular 10.  Hypertension.  Patient on lisinopril 40 mg daily prior to admission.  Resume as needed             6/8- BP well controlled off Lisinopril  6/18 BP well controlled overall, continue to follow    02/24/2022    6:09 AM 02/23/2022    8:19 PM 02/23/2022    4:37 AM  Vitals with BMI  Systolic [id number] Diastolic 51 60 63  Pulse 90 95 92    11.  Hyperlipidemia.  Restart Lovaza 2 g twice daily 12.  Psoriasis.  Restart Oteleza 30 mg twice daily             6/8- will send Otezla and cream to pharmacy so can use.  6/11 patient to continue Oteleza 30 mg BID 13. 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 Hypokalemia             6/8- K+ 2.9- will give KCL 40 mEq x3 and recheck in AM- was last 3.7             6/9- K+ 4.2 this AM- will recheck Monday             6/12- K+ 3.5- will put on low daily dose of KCL 10 mEq daily and recheck Thursday  6/15- K+ 3.6 today, continue low dose K+  Labs tomorrow  6/19- K+ 3.8- con' regimen 15. MASD under breasts             6/9- will consult WOC for recommendations- is severe. With open skin 16. Constipation             6/9- Will order sorbitol after therapy.  6/10 Patient's last BM was today sorbitol worked well.  6/20- LBM overnight- con't regimen 17. Moderate malnutrition             6/9- will add Ensure BID between meals 18. Dry mouth             6/9- doesn't have thrush on exam- will order Biotene mouthwash   6/13- Mouth less dry with Biotene 19.  Bilateral lower ankle edema 6/10 patient says that she has had edema that started on Thursday.  She says that it is mostly in the ankles wonders if it could be due to her psoriatic arthritis.  Some of the swelling on the anterior right ankle has subsided overall there is greater edema in the left ankle versus right there is no edema of the legs and +1 nonpitting edema the dorsal surface of both feet.  Edema is +2 nonpitting for the ankles.  Encourage use of elevation for suspected dependent edema if this does not work may also add on use of Ace  wrap. 6/11 PT placed TED hose (ankle high) on pt then placed ACE wrap over. Swelling of lower legs and ankles less today than yesterday.  Continue elevation of legs with pillows.   6/12- LE edema looking better- con't regimen  6/18 continue TED and leg elevation  6/19- likely due to mild LE weakness from central cord syndrome- needs to wear TEDs.    I spent a total of  41  minutes on total care today- >50% coordination of care- due to team conference as well as d/w pt about lovenox/Apixiban for 2 months total- so ~ 6 more weeks.     LOS: 13 days A FACE TO FACE EVALUATION WAS PERFORMED  Ellasyn Swilling 02/24/2022, 8:19 AM

## 2022-02-24 NOTE — Progress Notes (Signed)
Physical Therapy Session Note  Patient Details  Name: Shelby Fleming MRN: [id number] Date of Birth: 1946/02/06  Today's Date: 02/24/2022 PT Individual Time: 1130-1200; 2197-5883 PT Individual Time Calculation (min): 30 min and 63 min PT Missed Time: 12 min Missed Time Reason: patient fatigue  Short Term Goals: Week 2:  PT Short Term Goal 1 (Week 2): =LTG due to ELOS  Skilled Therapeutic Interventions/Progress Updates:    Session 1: Pt received seated in recliner in room, agreeable to PT session. No complaints of pain. Pt transfers and performs gait with RW at mod I level throughout session with use of RW. Reviewed HEP, see below. Handout provided. Pt left seated in recliner in room with needs in reach at end of session.  Access Code: [email or web address] URL: [email or web address] Date: 02/24/2022 Prepared by: Excell Seltzer  Exercises - Mini Squat with Counter Support  - 1 x daily - 7 x weekly - 3 sets - 10 reps - Standing Knee Flexion with Counter Support  - 1 x daily - 7 x weekly - 3 sets - 10 reps - Standing March with Unilateral Counter Support  - 1 x daily - 7 x weekly - 3 sets - 10 reps - Alternating Step Taps with Counter Support  - 1 x daily - 7 x weekly - 3 sets - 10 reps - Forward Step Up with Counter Support  - 1 x daily - 7 x weekly - 3 sets - 10 reps  Session 2: Pt received seated in recliner in room, agreeable to PT session. No complaints of pain. Pt transfers at mod I level with use of RW during session. Ambulatory transfer to bathroom with RW at mod I level. Pt is independent for clothing management, assist needed for thoroughness for pericare. Ambulation up/down ramp at mod I level with use of RW. Ambulation across uneven surface with RW and Supervision for balance with cues for safe RW management. Ascend/descend 12 x 6" stairs with 2 handrails and CGA for balance, alternating and step-to gait pattern. Pt able to safely retrieve objects from the ground with use of  reacher and RW at Supervision level. Pt reports she already owns a reacher at home. Ambulation x 300 ft with RW at mod I level. Pt left seated in recliner in room with needs in reach at end of session.  Therapy Documentation Precautions:  Precautions Precautions: Cervical Required Braces or Orthoses: Cervical Brace Cervical Brace: Hard collar, At all times Restrictions Weight Bearing Restrictions: No       Therapy/Group: Individual Therapy   Excell Seltzer, PT, DPT, CSRS 02/24/2022, 12:35 PM

## 2022-02-25 ENCOUNTER — Other Ambulatory Visit (HOSPITAL_COMMUNITY): Payer: Self-pay

## 2022-02-25 MED ORDER — APIXABAN 2.5 MG PO TABS
ORAL_TABLET | ORAL | 1 refills | Status: DC
Start: 2022-02-25 — End: 2022-07-10

## 2022-02-25 MED ORDER — APIXABAN 2.5 MG PO TABS
2.5000 mg | ORAL_TABLET | Freq: Two times a day (BID) | ORAL | Status: DC
  Administered 2022-02-25: 2.5 mg via ORAL
  Filled 2022-02-25: qty 1

## 2022-02-25 NOTE — Progress Notes (Signed)
INPATIENT REHABILITATION DISCHARGE NOTE   Discharge instructions by: Linna Hoff, PA  Verbalized understanding: Yes  Skin care/Wound care healing? yes  Pain: medicated prior to DC  IV's: none  Tubes/Drains: none  O2: none  Safety instructions: reviewed with pt and family  Patient belongings: sent with family and pt  Discharged to: home  Discharged via: family transport  Notes: done   Gerald Stabs, Therapist, sports

## 2022-02-25 NOTE — TOC Benefit Eligibility Note (Signed)
Patient Advocate Encounter ? ?Insurance verification completed.   ? ?The patient is currently admitted and upon discharge could be taking Eliquis 5 mg. ? ?The current 30 day co-pay is, $47.00.  ? ?The patient is currently admitted and upon discharge could be taking Xarelto 20 mg. ? ?The current 30 day co-pay is, $47.00.  ? ?The patient is insured through [employer] Medicare Part D  ? ? ? ?Shelby Fleming, CPhT ?Pharmacy Patient Advocate Specialist ?[facility] Patient Advocate Team ?Direct Number: [phone]  Fax: [phone] ? ? ? ? ? ?  ?

## 2022-02-25 NOTE — Progress Notes (Signed)
PROGRESS NOTE   Subjective/Complaints:   We discussed DVT risk in SCI patient's- I'd rather prevent a clot then treat after she gets one.  She agrees, but we both agreed to send her home on  PO Xarelto vs Elquis not Lovenox- the shots.   Happy with care and ready for d/c- knows she will f/u with me in clinic.   ROS:   Pt denies SOB, abd pain, CP, N/V/C/D, and vision changes   Objective:   No results found. Recent Labs    02/23/22 0515  WBC 6.4  HGB 9.8*  HCT 30.7*  PLT 390    Recent Labs    02/23/22 0515  NA 137  K 3.8  CL 102  CO2 26  GLUCOSE 131*  BUN <5*  CREATININE 0.85  CALCIUM 9.2    Intake/Output Summary (Last 24 hours) at 02/25/2022 0826 Last data filed at 02/25/2022 0700 Gross per 24 hour  Intake 720 ml  Output --  Net 720 ml         Physical Exam: Vital Signs Blood pressure (!) 103/52, pulse 95, temperature 98.1 F (36.7 C), temperature source Oral, resp. rate 16, height '5\' 4"'$  (1.626 m), weight 87.2 kg, SpO2 94 %.        General: awake, alert, appropriate, ate ~50% of tray; sitting up in bedside chair; NAD HENT: conjugate gaze; oropharynx moist- cervical collar CV: regular rate; no JVD Pulmonary: CTA B/L; no W/R/R- good air movement GI: soft, NT, ND, (+)BS Psychiatric: appropriate Neurological: Ox3  Extremities: No clubbing, cyanosis, LE swelling to mid calf B/L 1+ Pulses are 2+ Psych: very pleasant Skin: Incision site is clean dry and intact does have areas of psoriasis. Warm and dry otherwise Note: 1) Patient does have red moist macerated area under her bilateral breasts, which is improving. She was seen by [facility] nurse with recommendations for silver impregnated fabric to be used for 5 days at a time.   2) patient also has edema nonpitting bilateral ankles, medial malleolus and lateral maleollus, LE edema appears improved from several days ago  Has TED hose and ACE wrap in  place, during day. Neuro: Patient is alert.  Oriented x3 and follows commands well Musculoskeletal: She has hard cervical collar in place, arthritis of small hand joints.  RUE 4/5, LUE 3/5, 4/5 in lower extremities bilaterally.  Assessment/Plan: 1. Functional deficits which require 3+ hours per day of interdisciplinary therapy in a comprehensive inpatient rehab setting. Physiatrist is providing close team supervision and 24 hour management of active medical problems listed below. Physiatrist and rehab team continue to assess barriers to discharge/monitor patient progress toward functional and medical goals  Care Tool:  Bathing    Body parts bathed by patient: Right arm, Left arm, Chest, Abdomen, Front perineal area, Buttocks, Right upper leg, Left upper leg, Right lower leg, Left lower leg, Face   Body parts bathed by helper: Left lower leg, Right lower leg, Buttocks     Bathing assist Assist Level: Set up assist     Upper Body Dressing/Undressing Upper body dressing   What is the patient wearing?: Bra, Pull over shirt, Orthosis Orthosis activity level: Performed by helper  Upper body assist Assist Level: Minimal Assistance - Patient > 75%    Lower Body Dressing/Undressing Lower body dressing      What is the patient wearing?: Underwear/pull up, Pants     Lower body assist Assist for lower body dressing: Supervision/Verbal cueing     Toileting Toileting    Toileting assist Assist for toileting: Supervision/Verbal cueing     Transfers Chair/bed transfer  Transfers assist     Chair/bed transfer assist level: Independent with assistive device Chair/bed transfer assistive device: Programmer, multimedia   Ambulation assist      Assist level: Independent with assistive device Assistive device: Walker-rolling Max distance: 300'   Walk 10 feet activity   Assist     Assist level: Independent with assistive device Assistive device: Walker-rolling    Walk 50 feet activity   Assist Walk 50 feet with 2 turns activity did not occur: Safety/medical concerns  Assist level: Independent with assistive device Assistive device: Walker-rolling    Walk 150 feet activity   Assist Walk 150 feet activity did not occur: Safety/medical concerns  Assist level: Independent with assistive device Assistive device: Walker-rolling    Walk 10 feet on uneven surface  activity   Assist Walk 10 feet on uneven surfaces activity did not occur: Safety/medical concerns   Assist level: Supervision/Verbal cueing Assistive device: Walker-rolling   Wheelchair     Assist Is the patient using a wheelchair?: No Type of Wheelchair: Manual    Wheelchair assist level: Dependent - Patient 0%      Wheelchair 50 feet with 2 turns activity    Assist        Assist Level: Dependent - Patient 0%   Wheelchair 150 feet activity     Assist      Assist Level: Dependent - Patient 0%   Blood pressure (!) 103/52, pulse 95, temperature 98.1 F (36.7 C), temperature source Oral, resp. rate 16, height '5\' 4"'$  (1.626 m), weight 87.2 kg, SpO2 94 %.    Medical Problem List and Plan: 1. 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  Cervical collar as directed             -patient may shower             -ELOS/Goals: 5-7 days S  -She would likely benefit from hospital bed  -Discharge date expected 6/21, min A to Mod I  D/c today- needs f/u with Dr Dagoberto Ligas- if cannot get in for 2+ months, see if pt can see Dr Marciano Sequin initially then see me from then on.  2.  Antithrombotics: -DVT/anticoagulation:  Pharmaceutical: Lovenox check vascular study  6/13- has superficial Venous thrombosis very low down- will not treat- per CHEST guidelines  6/20- changed from tx dose  lovenox to daily 40 mg daily- CHEST guidelines suggest 2 months of Lovenox even though walking- will d/w pt and see if she is willing to do Apixiban? 6/21- will check with pharmacy on copays with Eliquis 2.5 mg BID and send home with 6 weeks of meds to prevent DVT in SCI patient. Per CHEST guidelines             -antiplatelet therapy: N/A 3. Postoperative pain: continue Tramadol 50 mg every 6 hours, Neurontin 100 mg twice daily, Flexeril 10 mg 3 times daily as needed, oxycodone as needed  6/20- hasn't been receiving Oxycodone for at least 2 days-   6/21- will send home on tramadol- change to prn 4. Mood: Provide emotional support             -antipsychotic agents: N/A 5. Neuropsych: This patient is capable of making decisions on her own behalf. 6. Skin/Wound Care: Routine skin checks 7. Fluids/Electrolytes/Nutrition: Routine in and outs with follow-up chemistries 8.  Neurogenic bowel and bladder.  Check PVR.  Patient had initially refused intermittent catheterization.  Establish bowel program             6/8- will order bladder scans to see if retaining still- not clear if needs bowel program quite yet.              6/9- is voiding- will con't to monitor to see if needs caths 6/11 Patient is voiding okay without needing caths  6/12- LBM yesterday small- bladder scans <100cc- con'to monitor  6/15 BM yesterday, bladder scans 0-133m  6/16 further bladder scan yesterday <100  6/18 Appears to be voiding well without need for caths, BM yesterday  6/19- LBM yesterday- no bowel program needed- also bladder scans <50cc- will d/c scans 9.  Dysphagia.  Improved.  Diet advanced to regular 10.  Hypertension.  Patient on lisinopril 40 mg daily prior to admission.  Resume as needed             6/8- BP well controlled off Lisinopril  6/18 BP well controlled overall, continue to follow    02/25/2022    6:34 AM 02/24/2022    7:54 PM 02/24/2022    3:39 PM  Vitals with BMI  Systolic [id number]  Diastolic 52 60 68  Pulse 95 98 93    11.  Hyperlipidemia.  Restart Lovaza 2 g twice daily 12.  Psoriasis.  Restart Oteleza 30 mg twice daily             6/8- will send Otezla and cream to pharmacy so can use.  6/11 patient to continue Oteleza 30 mg BID 13. 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 Hypokalemia             6/8- K+ 2.9- will give KCL 40 mEq x3 and recheck in AM- was last 3.7             6/9- K+ 4.2 this AM- will recheck Monday            6/12- K+ 3.5- will put on low daily dose of KCL 10 mEq daily and recheck Thursday  6/15- K+ 3.6 today, continue low dose K+  Labs tomorrow  6/19- K+ 3.8- con' regimen 15. MASD under breasts             6/9- will consult WOC for recommendations- is severe. With open skin 16. Constipation             6/9- Will order sorbitol after therapy.  6/10 Patient's last BM was today sorbitol worked well.  6/20- LBM overnight- con't regimen 17. Moderate malnutrition             6/9- will add Ensure BID between meals 18. Dry mouth             6/9- doesn't have thrush on exam- will order Biotene mouthwash   6/13- Mouth less dry with Biotene 19.  Bilateral lower ankle edema 6/10 patient says that she has had edema that started on Thursday.  She says that it is  mostly in the ankles wonders if it could be due to her psoriatic arthritis.  Some of the swelling on the anterior right ankle has subsided overall there is greater edema in the left ankle versus right there is no edema of the legs and +1 nonpitting edema the dorsal surface of both feet.  Edema is +2 nonpitting for the ankles.  Encourage use of elevation for suspected dependent edema if this does not work may also add on use of Ace wrap. 6/11 PT placed TED hose (ankle high) on pt then placed ACE wrap over. Swelling of lower legs and ankles less today than yesterday.  Continue elevation of legs with pillows.   6/12- LE edema looking better- con't regimen  6/18 continue TED and leg  elevation  6/19- likely due to mild LE weakness from central cord syndrome- needs to wear TEDs.    I spent a total of 34   minutes on total care today- >50% coordination of care- due to d/w pharmacy about changing to Eliquis for DVT prophylaxis since she's a SCI.     LOS: 14 days A FACE TO FACE EVALUATION WAS PERFORMED  Cory Kitt 02/25/2022, 8:26 AM

## 2022-02-26 NOTE — Progress Notes (Signed)
Inpatient Rehabilitation Care Coordinator Discharge Note   Patient Details  Name: Shelby Fleming MRN: [id number] Date of Birth: 1946/08/08   Discharge location: D/c to home  Length of Stay: 13 days  Discharge activity level: Mod I  Home/community participation: Limited  Patient response TX:MIWOEH Literacy - How often do you need to have someone help you when you read instructions, pamphlets, or other written material from your doctor or pharmacy?: Never  Patient response OZ:[id number] Isolation - How often do you feel lonely or isolated from those around you?: Never  Services provided included: MD, RD, PT, SLP, RN, CM, Pharmacy, SW, Neuropsych, TR, OT  Financial Services:  Charity fundraiser Utilized: [place] Medicare  Choices offered to/list presented to: Yes  Follow-up services arranged:  Outpatient, DME    Outpatient Servicies: [facility] for outpatient PT/OT DME : [employer] [facility] for RW and [facility]   Patient response to transportation need: Is the patient able to respond to transportation needs?: Yes In the past 12 months, has lack of transportation kept you from medical appointments or from getting medications?: No In the past 12 months, has lack of transportation kept you from meetings, work, or from getting things needed for daily living?: No   Comments (or additional information):  Patient/Family verbalized understanding of follow-up arrangements:  Yes  Individual responsible for coordination of the follow-up plan: contact pt or pt dtr Shelby Fleming  Confirmed correct DME delivered: Shelby Fleming 02/26/2022    Shelby Fleming

## 2022-03-06 DIAGNOSIS — G9520 Unspecified cord compression: Secondary | ICD-10-CM | POA: Diagnosis not present

## 2022-03-06 DIAGNOSIS — I809 Phlebitis and thrombophlebitis of unspecified site: Secondary | ICD-10-CM | POA: Diagnosis not present

## 2022-03-06 DIAGNOSIS — E1169 Type 2 diabetes mellitus with other specified complication: Secondary | ICD-10-CM | POA: Diagnosis not present

## 2022-03-11 ENCOUNTER — Other Ambulatory Visit: Payer: Self-pay | Admitting: Physical Medicine and Rehabilitation

## 2022-03-11 MED ORDER — TRAMADOL HCL 50 MG PO TABS: 50.0000 mg | ORAL_TABLET | Freq: Four times a day (QID) | ORAL | 1 refills | Status: DC | PRN

## 2022-03-16 ENCOUNTER — Encounter
Payer: Medicare Other | Attending: Physical Medicine and Rehabilitation | Admitting: Physical Medicine and Rehabilitation

## 2022-03-16 ENCOUNTER — Encounter: Payer: Self-pay | Admitting: Physical Medicine and Rehabilitation

## 2022-03-16 VITALS — BP 134/79 | HR 91 | Ht 64.0 in | Wt 179.4 lb

## 2022-03-16 DIAGNOSIS — S12391D Other nondisplaced fracture of fourth cervical vertebra, subsequent encounter for fracture with routine healing: Secondary | ICD-10-CM | POA: Diagnosis not present

## 2022-03-16 DIAGNOSIS — S14129D Central cord syndrome at unspecified level of cervical spinal cord, subsequent encounter: Secondary | ICD-10-CM

## 2022-03-16 DIAGNOSIS — R2689 Other abnormalities of gait and mobility: Secondary | ICD-10-CM

## 2022-03-16 NOTE — Progress Notes (Signed)
Subjective:    Patient ID: Shelby Fleming, female    DOB: 20-Sep-1945, 76 y.o.   MRN: [id number]  HPI Pt is a 76 yr old female recent hx 02/02/22 of Central cord syndrome/traumatic incomplete C4 quadriplegia s/p C4 corpectomy/discectomy/foraminotomies with Neurogenic bowel and bladder and superficial thrombosis of Distal LE; as well as HTN, HLD, psoriasis and B/L hand OA, and MASD under breasts Here for hospital f/u on SCI   Walking great- using RW.  Still in cervical collar- for 3 more weeks per surgeon.    Having more swelling- it goes away every night.  Puts legs up and in 10 minutes, legs better.  Didn't start therapy outpt 7/19- at [place]-  Is doing therapy exercises- doing something every day.   Washing dishes and sweeping and folding laundry.   Didn't use a RW before fall.  Is furniture walking at home.   Still has sensation on R triceps- doesn't have strength to lift arm completely yet. Mainly to do hair is the problem.  Sometimes has sensation into R thumb and works up to R triceps- Copper gloves seemed to help.   Not having much/any pain- just discomfort of cervical collar, but not pain.  Also has DJD/OA in R shoulder.   Bowels- just not regular- can take miralax and just not as frequent.   Pain Inventory Average Pain 2 Pain Right Now 3 My pain is intermittent, tingling, and aching  LOCATION OF PAIN  neck, shoulder,wrist, hand, fingers, buttocks, ankle  BOWEL Number of stools per week: 5 Oral laxative use Yes  Type of laxative Miralax   BLADDER Normal    Mobility walk without assistance use a walker ability to climb steps?  yes do you drive?  no Do you have any goals in this area?  yes  Function retired Do you have any goals in this area?  no  Neuro/Psych No problems in this area  Prior Studies Any changes since last visit?  no  Physicians involved in your care Any changes since last visit?  no   History reviewed. No pertinent family  history. Social History   Socioeconomic History   Marital status: Single    Spouse name: Not on file   Number of children: Not on file   Years of education: Not on file   Highest education level: Not on file  Occupational History   Not on file  Tobacco Use   Smoking status: Never   Smokeless tobacco: Never  Vaping Use   Vaping Use: Never used  Substance and Sexual Activity   Alcohol use: No   Drug use: No   Sexual activity: Never    Birth control/protection: None  Other Topics Concern   Not on file  Social History Narrative   Not on file   Social Determinants of Health   Financial Resource Strain: Not on file  Food Insecurity: Not on file  Transportation Needs: Not on file  Physical Activity: Not on file  Stress: Not on file  Social Connections: Not on file   Past Surgical History:  Procedure Laterality Date   ABDOMINAL HYSTERECTOMY     ANTERIOR CERVICAL DECOMP/DISCECTOMY FUSION N/A 02/02/2022   Procedure: CERVICAL FOUR CORPECTOMY WITH PLATE REMOVAL;  Surgeon: Vallarie Mare, MD;  Location: [facility];  Service: Neurosurgery;  Laterality: N/A;   BACK SURGERY     Past Medical History:  Diagnosis Date   Hypertension    Psoriasis    BP 134/79   Pulse 91  Ht '5\' 4"'$  (1.626 m)   Wt 179 lb 6.4 oz (81.4 kg)   SpO2 94%   BMI 30.79 kg/m   Opioid Risk Score:   Fall Risk Score:  `1  Depression screen [facility] 2/9     03/16/2022   11:08 AM  Depression screen PHQ 2/9  Decreased Interest 0  Down, Depressed, Hopeless 0  PHQ - 2 Score 0  Altered sleeping 0  Tired, decreased energy 1  Change in appetite 1  Feeling bad or failure about yourself  0  Trouble concentrating 0  Moving slowly or fidgety/restless 0  Suicidal thoughts 0  PHQ-9 Score 2     Review of Systems  Constitutional: Negative.   HENT: Negative.    Eyes: Negative.   Respiratory: Negative.    Cardiovascular: Negative.   Gastrointestinal: Negative.   Endocrine: Negative.   Genitourinary: Negative.    Musculoskeletal:  Positive for gait problem.  Skin: Negative.   Allergic/Immunologic: Negative.   Neurological:  Positive for numbness.  Hematological: Negative.   Psychiatric/Behavioral: Negative.        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   2. Need to go to outpatient PT- and OUT for hands- so know when to get rid of RW.  3. Spinal cord injury/bruising can truly affect bowels- but keep an eye on it and let me know if worse.   4.  I cannot answer complete prognosis. We have 12 months til get back what you will get back.  75% of recovery in first 90 days. Great curve and has already recovered 80-90% of what out goal is.    5. Continue Apixiban for a total of 2 months- until 04/08/22.    6. Just got Tramadol from this clinic- will continue. Taking ~ 2x/day. Will continue- can try and wean, but not at same time as gabapentin.   7. For nerve pain- Start with this one- Can try to wean Gabapentin to 100 mg nightly- 4-7 days; if no increased nerve pain, then stop gabapentin.   8.  F/U- 3 months double appt- SCI   I spent a total of  31  minutes on total care today- >50% coordination of care- due to  education on swelling; nerve pain; and how to wean meds and education on prognosis from SCI

## 2022-03-16 NOTE — Patient Instructions (Signed)
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   2. Need to go to outpatient PT- and OUT for hands- so know when to get rid of RW.  3. Spinal cord injury/bruising can truly affect bowels- but keep an eye on it and let me know if worse.   4.  I cannot answer complete prognosis. We have 12 months til get back what you will get back.  75% of recovery in first 90 days. Great curve and has already recovered 80-90% of what out goal is.    5. Continue Apixiban for a total of 2 months- until 04/08/22.    6. Just got Tramadol from this clinic- will continue. Taking ~ 2x/day. Will continue- can try and wean, but not at same time as gabapentin.   7. For nerve pain- Start with this one- Can try to wean Gabapentin to 100 mg nightly- 4-7 days; if no increased nerve pain, then stop gabapentin.   8. F/U- 3 months double appt- SCI

## 2022-03-25 ENCOUNTER — Encounter: Payer: Self-pay | Admitting: Occupational Therapy

## 2022-03-25 ENCOUNTER — Ambulatory Visit: Payer: Medicare Other

## 2022-03-25 ENCOUNTER — Ambulatory Visit: Payer: Medicare Other | Attending: Physician Assistant | Admitting: Occupational Therapy

## 2022-03-25 DIAGNOSIS — R262 Difficulty in walking, not elsewhere classified: Secondary | ICD-10-CM | POA: Diagnosis not present

## 2022-03-25 DIAGNOSIS — M6281 Muscle weakness (generalized): Secondary | ICD-10-CM | POA: Diagnosis not present

## 2022-03-25 DIAGNOSIS — R2689 Other abnormalities of gait and mobility: Secondary | ICD-10-CM | POA: Diagnosis not present

## 2022-03-25 DIAGNOSIS — R208 Other disturbances of skin sensation: Secondary | ICD-10-CM | POA: Insufficient documentation

## 2022-03-25 DIAGNOSIS — M25611 Stiffness of right shoulder, not elsewhere classified: Secondary | ICD-10-CM | POA: Insufficient documentation

## 2022-03-25 DIAGNOSIS — R2681 Unsteadiness on feet: Secondary | ICD-10-CM

## 2022-03-25 NOTE — Therapy (Signed)
OUTPATIENT PHYSICAL THERAPY NEURO EVALUATION   Patient Name: Shelby Fleming MRN: [id number] DOB:1946/07/07, 76 y.o., female Today's Date: 03/25/2022   PCP: London Pepper REFERRING PROVIDER: Lauraine Rinne   PT End of Session - 03/25/22 1444     Visit Number 1    Number of Visits 6    Date for PT Re-Evaluation 05/20/22    Authorization Type [employer] Medicare    PT Start Time 9767    PT Stop Time 1530    PT Time Calculation (min) 45 min    Equipment Utilized During Treatment Gait belt    Activity Tolerance Patient tolerated treatment well    Behavior During Therapy WFL for tasks assessed/performed             Past Medical History:  Diagnosis Date   Hypertension    Psoriasis    Past Surgical History:  Procedure Laterality Date   ABDOMINAL HYSTERECTOMY     ANTERIOR CERVICAL DECOMP/DISCECTOMY FUSION N/A 02/02/2022   Procedure: CERVICAL FOUR CORPECTOMY WITH PLATE REMOVAL;  Surgeon: Vallarie Mare, MD;  Location: [facility];  Service: Neurosurgery;  Laterality: N/A;   BACK SURGERY     Patient Active Problem List   Diagnosis Date Noted   Impaired gait and mobility 03/16/2022   Pressure injury of skin 02/12/2022   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[place]) 02/11/2022   C4 cervical fracture ([place]) 01/31/2022   Closed cervical spine fracture ([place]) 01/31/2022    ONSET DATE: 02/02/22  REFERRING DIAG: H41.937T (ICD-10-CM) - Central cord syndrome at unspecified level of cervical spinal cord, initial encounter   THERAPY DIAG:  Difficulty in walking, not elsewhere classified  Unsteadiness on feet  Rationale for Evaluation and Treatment Rehabilitation  SUBJECTIVE:                                                                                                                                                                                             SUBJECTIVE STATEMENT: Notes continued RUE problems since injury/surgery which prohibits her completing her hair/ADL. Pt had therapy via inpatient  rehab and was D/C to home on 02/25/22. Pt usually lives alone and her daughter has been available to assist since time of surgery. Pt reports her walking and balance are improving quickly and she feels more confident walking without RW but notes reduced endurance Pt accompanied by: family member  PERTINENT HISTORY: 76 yr old female recent hx 02/02/22 of Central cord syndrome/traumatic incomplete C4 quadriplegia - ASIA D- s/p C4 corpectomy/discectomy/foraminotomies with Neurogenic bowel and bladder and superficial thrombosis of Distal LE; as well as HTN, HLD, psoriasis and B/L hand OA, and MASD under breasts  PAIN:  Are you having pain? No  PRECAUTIONS: Other: [place] collar , lifting restrictions (0#), no bending  WEIGHT BEARING RESTRICTIONS No  FALLS: Has patient fallen in last 6 months? Yes. Number of falls 1  LIVING ENVIRONMENT: Lives with: lives alone Lives in: House/apartment Stairs: Yes: External: 2 steps; on left going up Has following equipment at home: Gilford Rile - 2 wheeled Has walk-in shower, shower chair, BSC, bed rail  PLOF: [place] return to independence  OBJECTIVE:   DIAGNOSTIC FINDINGS: imaging pre and post-op  COGNITION: Overall cognitive status: Within functional limits for tasks assessed   SENSATION: WFL  COORDINATION: WNL  EDEMA:  Wearing compression socks  MUSCLE TONE: WNL   DTRs:  DNT  POSTURE: No Significant postural limitations  LOWER EXTREMITY ROM:     WNL  LOWER EXTREMITY MMT:    Seated resisted tests reveal 4/5 gross strength BLE, no unilateral deficits noted  BED MOBILITY:  DNT  TRANSFERS: Assistive device utilized: None  Sit to stand: Complete Independence Stand to sit: Complete Independence Chair to chair: Complete Independence Floor:  DNT due to safety concerns of cervical precautions  RAMP:  Level of Assistance: Modified independence Assistive device utilized: Walker - 2 wheeled Ramp Comments:   CURB:   Level of Assistance: CGA Assistive device utilized: Environmental consultant - 2 wheeled Curb Comments: noted to catch toe edge of step  STAIRS:  Level of Assistance: SBA and CGA  Stair Negotiation Technique: Alternating Pattern  with Single Rail on Left  Number of Stairs: 8   Height of Stairs: 4-6"  Comments:   GAIT: Gait pattern: WFL Distance walked:  Assistive device utilized: Environmental consultant - 2 wheeled and None Level of assistance: Modified independence Comments: slow, but steady  FUNCTIONAL TESTs:  5 times sit to stand: 15.45 sec Timed up and go (TUG): 21.09 sec w/out RW, slow/steady turn 10 meter walk test: 2.6 ft/sec Berg Balance Scale: 50/56  PATIENT SURVEYS:    TODAY'S TREATMENT:  assessment   PATIENT EDUCATION: Education details: assessment findings Person educated: Patient and Child(ren) Education method: Explanation Education comprehension: verbalized understanding   HOME EXERCISE PROGRAM: To be initiated    GOALS: Goals reviewed with patient? Yes  SHORT TERM GOALS: Target date: 04/15/2022  Patient will be independent in HEP to improve functional outcomes Baseline: Goal status: INITIAL  2.  Demonstrate improved balance and safety with gait per time 15 sec TUG test Baseline: 21 sec w/out AD Goal status: INITIAL    LONG TERM GOALS: Target date: 05/06/2022  The patient will improve gait speed to > or equal to 3.0 ft/sec and return to full community ambulator classification of gait Baseline: 2.6 ft/sec w/out AD Goal status: INITIAL  2.  Manifest low risk for falls per TUG test time 12 sec or better to improve safety with gait Baseline: 21.09 sec w/out AD Goal status: INITIAL  3.  Demonstrate/report ability to stand/ambulate x 30 min w/out need for rest period to improve activity tolerance for community outings/shopping Baseline: 5-10 min per report Goal status: INITIAL  4.  Demonstrate modified independent stair ambulation while carrying bag to improve safety with  home entry/exit Baseline: SBA-CGA Goal status: INITIAL    ASSESSMENT:  CLINICAL IMPRESSION: Patient is a 76 y.o. lady who was seen today for physical therapy evaluation and treatment for unsteadiness on feet and difficulty in walking. Presents with some generalized weakness in BLE without focal deficits with high risk for falls per TUG test time and demonstrates reduced ability  to safely ambulate requiring supervision for stair negotiation and lacking activity tolerance/endurance for sustained ambulation/activity.  PT services indicated to train/instruct in DME use and provide intervention strategies to improve independence with functional mobility to reduce risk for falls and need for any caregiver assistance to restore abilities to PLOF    OBJECTIVE IMPAIRMENTS decreased activity tolerance, decreased balance, decreased endurance, decreased knowledge of use of DME, difficulty walking, and decreased strength.   ACTIVITY LIMITATIONS lifting, standing, squatting, stairs, and locomotion level  PARTICIPATION LIMITATIONS: meal prep, shopping, and community activity  PERSONAL FACTORS Age and Time since onset of injury/illness/exacerbation are also affecting patient's functional outcome.   REHAB POTENTIAL: Excellent  CLINICAL DECISION MAKING: Evolving/moderate complexity  EVALUATION COMPLEXITY: Moderate  PLAN: PT FREQUENCY: 1x/week  PT DURATION: 6 weeks  PLANNED INTERVENTIONS: Therapeutic exercises, Therapeutic activity, Neuromuscular re-education, Balance training, Gait training, Patient/Family education, Self Care, Joint mobilization, Stair training, Vestibular training, Canalith repositioning, DME instructions, Aquatic Therapy, Dry Needling, Electrical stimulation, and Manual therapy  PLAN FOR NEXT SESSION: establish HEP: turning activity, alt stair taps, single limb support activities, ambulation with cane   6:01 PM, 03/25/22 M. Sherlyn Lees, PT, DPT Physical Therapist-  [place] Office Number: [phone]

## 2022-03-25 NOTE — Therapy (Unsigned)
OUTPATIENT OCCUPATIONAL THERAPY NEURO EVALUATION  Patient Name: Shelby Fleming MRN: [id number] DOB:01-14-1946, 76 y.o., female Today's Date: 03/25/2022  PCP: London Pepper, MD REFERRING PROVIDER: Cathlyn Parsons, PA-C   OT End of Session - 03/25/22 1535     Visit Number 1    Number of Visits 9   Date for OT Re-Evaluation 05/24/2022   Authorization Type U[employer] Medicare   Authorization Time Period VL: [place]   OT Start Time 1533    OT Stop Time 2423    OT Time Calculation (min) 42 min    Behavior During Therapy [facility] for tasks assessed/performed           Past Medical History:  Diagnosis Date   Hypertension    Psoriasis    Past Surgical History:  Procedure Laterality Date   ABDOMINAL HYSTERECTOMY     ANTERIOR CERVICAL DECOMP/DISCECTOMY FUSION N/A 02/02/2022   Procedure: CERVICAL FOUR CORPECTOMY WITH PLATE REMOVAL;  Surgeon: Vallarie Mare, MD;  Location: [facility];  Service: Neurosurgery;  Laterality: N/A;   BACK SURGERY     Patient Active Problem List   Diagnosis Date Noted   Impaired gait and mobility 03/16/2022   Pressure injury of skin 02/12/2022   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[place]) 02/11/2022   C4 cervical fracture ([place]) 01/31/2022   Closed cervical spine fracture ([place]) 01/31/2022    ONSET DATE: 01/31/22  REFERRING DIAG: N36.144R (ICD-10-CM) - Central cord syndrome at unspecified level of cervical spinal cord, initial encounter   THERAPY DIAG:  Stiffness of right shoulder, not elsewhere classified  Muscle weakness (generalized)  Other abnormalities of gait and mobility  Other disturbances of skin sensation  Rationale for Evaluation and Treatment Rehabilitation  SUBJECTIVE:   SUBJECTIVE STATEMENT: Pt arrives to OP OT evaluation w/ primary concerns related to decreased functional use of RUE s/p a fall and subsequent surgery on 02/02/22. Pt presented to the hospital w/ temporary paresis, stating, "I couldn't get up on my own," and then severe dysesthesias of BUEs. Pt and  her daughter state that her biggest goal is being able to do her hair again. Pt also reports that she has been doing as much as she can at home to keep active and facilitate continued recovery. Pt accompanied by: family member (daughter, Shelby Fleming)  PERTINENT HISTORY: Central cord syndrome/traumatic incomplete C4 quadriplegia s/p C4 corpectomy, including discectomy and bil foraminotomies on 02/02/22 after sustaining a fall on 01/31/22. PMH includes hx of C5-7 ACDF, HTN, HLD, psoriasis, and severe bil hand OA.  Per chart review: CT cervical spine showed fracture through the C4 vertebral body in the inferior anterior corner at C3. 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 MRI cervical spine again known C4 body and C3 anterior inferior corner fractures. 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 No visible injury to the posterior elements. C4-5 cord flattening primarily from central disc protrusion. Milder degenerative spinal stenosis C3-4. By foraminal impingement at C3-4 and C4-5. Left foraminal impingement of C7-T1 as well as C5-C7 ACDF.   PRECAUTIONS: Cervical collar for about 3 more weeks per surgeon  WEIGHT BEARING RESTRICTIONS No  PAIN: Are you having pain? No  FALLS: Has patient fallen in last 6 months? Yes. Number of falls - 1 at onset  LIVING ENVIRONMENT: Lives with: lives alone; daughter is currently staying with her Lives in: House/apartment Stairs: No Has following equipment at home: Gilford Rile - 2 wheeled  PLOF: Independent  PATIENT GOALS: "curl my hair"  OBJECTIVE:   HAND DOMINANCE: Right  ADLs: Overall ADLs: Limited AROM of R shoulder w/ decreased strength impacting pt's ability to use RUE as her dominant extremity during functional tasks involving reaching at or above shoulder level  Eating: Independent Grooming: Requires assist w/ hair care Dressing: Mod Ind Toileting: Mod Ind Bathing: Mod Ind Tub Shower  transfers: Ambulating w/ walk-in shower at distant SPV Equipment: Shower seat with back, Grab bars, Walk in shower, bed side commode, Dressing stick, Reacher, Sock aid, Long handled shoe horn, Long handled sponge, Feeding equipment, and temporary bed rail  IADLs: Light housekeeping: Mod Ind Meal Prep: Mod Ind w/ light meal/snack prep Community mobility: Relying on family at this time Medication management: Independent Handwriting:  No difficulty  MOBILITY STATUS: Ambulated in/out of session w/out difficulty using RW  FUNCTIONAL OUTCOME MEASURES: Upper Extremity Functional Scale (UEFS): 70/80  UPPER EXTREMITY ROM     Active ROM Right Eval - 7/19 Left Eval - 7/19  Shoulder flexion 28 120  Shoulder abduction 36 89  Shoulder adduction    Shoulder extension    Shoulder internal rotation    Shoulder external rotation    Elbow flexion 135   Elbow extension 51   Wrist flexion    Wrist extension 53 64  Wrist ulnar deviation    Wrist radial deviation    Wrist pronation    Wrist supination    (Blank rows = not tested)  UPPER EXTREMITY MMT: Unable to assess due to medical restrictions  HAND FUNCTION: Not assessed at evaluation due to medical precautions  COORDINATION: 9 Hole Peg test: Right: 26.2 sec; Left: 31.2 sec  SENSATION: WFL; reports some paresthesias/hypoesthesia in RUE  EDEMA: No observable edema  COGNITION: Overall cognitive status: Within functional limits for tasks assessed  VISION: Subjective report: Reports no concerns at this time  TODAY'S TREATMENT:  None   PATIENT EDUCATION: Educated on role and purpose of OT as well as potential interventions and goals for therapy based on initial evaluation findings. Person educated: Patient and Child(ren) Education method: Explanation Education comprehension: verbalized understanding   HOME EXERCISE PROGRAM: To be administered   GOALS: Goals reviewed with patient? No  SHORT TERM GOALS: Target date:  04/24/22    Status:  1 Pt will demonstrate independence w/ initial HEP designed for RUE ROM Baseline: To be administered Initial  2 Pt to increase R shoulder flexion to at least 45 degrees for improved participation in functional tasks involving forward reach Baseline: 28* of shoulder flex Initial  3 Pt to increase R elbow extension to at least 15 degrees for improved participation in functional tasks involving forward reach Baseline: 51* of elbow ext Initial    LONG TERM GOALS: Target date: 05/24/22    Status:  1 Pt will increase strength in R arm to at least 4/5 MMT for improved functional ability to carry out self-care and higher-level homecare tasks with less difficulty Baseline: 2+/5 Initial  2 Pt will improve grip strength in R hand to at least 20 lbs for functional use at home and in IADLs  Baseline: Unable to assess at evaluation Initial  3 Pt will demonstrate ability to complete hair care task w/ Mod Ind by d/c Baseline: Requires assist w/ hair care Initial     ASSESSMENT:  CLINICAL IMPRESSION: Pt is a 76 y/o who presents to OP OT due to RUE weakness and decreased ROM 2/2 central cord syndrome/traumatic incomplete C4 quadriplegia s/p C4 corpectomy, including discectomy and bil foraminotomies on 02/02/22 after  sustaining a fall on 01/31/22. PMH includes hx of C5-7 ACDF, HTN, HLD, psoriasis, and severe bil hand OA. Pt currently lived alone prior to onset w/ her daughter currently living w/ her to provide assist as needed. Pt will benefit from skilled occupational therapy services to address strength, ROM, altered sensation, balance, problem-solving w/ pt to develop compensatory strategies including use of AE prn, and implementation of an HEP to improve participation and safety during ADLs and IADLs.  PERFORMANCE DEFICITS in functional skills including sensation, ROM, strength, pain, mobility, balance, body mechanics, decreased knowledge of precautions, and UE functional  use.  IMPAIRMENTS are limiting patient from ADLs, IADLs, rest and sleep, and leisure.   COMORBIDITIES may have co-morbidities  that affects occupational performance. Patient will benefit from skilled OT to address above impairments and improve overall function.  MODIFICATION OR ASSISTANCE TO COMPLETE EVALUATION: No modification of tasks or assist necessary to complete an evaluation.  OT OCCUPATIONAL PROFILE AND HISTORY: Problem focused assessment: Including review of records relating to presenting problem.  CLINICAL DECISION MAKING: Moderate - several treatment options, min-mod task modification necessary  REHAB POTENTIAL: Good  EVALUATION COMPLEXITY: Moderate   PLAN: OT FREQUENCY: 1x/week  OT DURATION: 8 weeks  PLANNED INTERVENTIONS: self care/ADL training, therapeutic exercise, therapeutic activity, neuromuscular re-education, manual therapy, passive range of motion, functional mobility training, aquatic therapy, splinting, electrical stimulation, moist heat, cryotherapy, patient/family education, and DME and/or AE instructions  RECOMMENDED OTHER SERVICES: Currently receiving PT services at this location  CONSULTED AND AGREED WITH PLAN OF CARE: Patient and family member/caregiver  PLAN FOR NEXT SESSION: Review HEP administered in hospital and progress/update exercises for R shoulder   Kathrine Cords, OTR/L 03/25/2022, 5:44 PM

## 2022-04-01 ENCOUNTER — Ambulatory Visit: Payer: Medicare Other | Admitting: Occupational Therapy

## 2022-04-01 ENCOUNTER — Ambulatory Visit: Payer: Medicare Other

## 2022-04-01 DIAGNOSIS — R2681 Unsteadiness on feet: Secondary | ICD-10-CM

## 2022-04-01 DIAGNOSIS — R262 Difficulty in walking, not elsewhere classified: Secondary | ICD-10-CM

## 2022-04-01 DIAGNOSIS — M6281 Muscle weakness (generalized): Secondary | ICD-10-CM | POA: Diagnosis not present

## 2022-04-01 DIAGNOSIS — M25611 Stiffness of right shoulder, not elsewhere classified: Secondary | ICD-10-CM

## 2022-04-01 DIAGNOSIS — R208 Other disturbances of skin sensation: Secondary | ICD-10-CM

## 2022-04-01 DIAGNOSIS — R2689 Other abnormalities of gait and mobility: Secondary | ICD-10-CM

## 2022-04-01 NOTE — Therapy (Signed)
OUTPATIENT OCCUPATIONAL THERAPY Treatment Session  Patient Name: Shelby Fleming MRN: [id number] DOB:Sep 01, 1946, 76 y.o., female Today's Date: 04/01/2022  PCP: London Pepper, MD REFERRING PROVIDER: Cathlyn Parsons, PA-C   OT End of Session - 04/01/22 1556     Visit Number 2    Number of Visits 9    Date for OT Re-Evaluation 05/24/22    Authorization Type U[employer] Medicare    Authorization Time Period VL: [place]    OT Start Time 9030    OT Stop Time 1616    OT Time Calculation (min) 38 min    Behavior During Therapy WFL for tasks assessed/performed              Past Medical History:  Diagnosis Date   Hypertension    Psoriasis    Past Surgical History:  Procedure Laterality Date   ABDOMINAL HYSTERECTOMY     ANTERIOR CERVICAL DECOMP/DISCECTOMY FUSION N/A 02/02/2022   Procedure: CERVICAL FOUR CORPECTOMY WITH PLATE REMOVAL;  Surgeon: Vallarie Mare, MD;  [facility];  Service: Neurosurgery;  Laterality: N/A;   BACK SURGERY     Patient Active Problem List   Diagnosis Date Noted   Impaired gait and mobility 03/16/2022   Pressure injury of skin 02/12/2022   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   C4 cervical fracture ([place]) 01/31/2022   Closed cervical spine fracture ([place]) 01/31/2022    ONSET DATE: 01/31/22  REFERRING DIAG: S92.330Q (ICD-10-CM) - Central cord syndrome at unspecified level of cervical spinal cord, initial encounter   THERAPY DIAG:  Stiffness of right shoulder, not elsewhere classified  Muscle weakness (generalized)  Unsteadiness on feet  Other disturbances of skin sensation  Rationale for Evaluation and Treatment Rehabilitation  SUBJECTIVE:   SUBJECTIVE STATEMENT: Pt reports she has been washing the dishes and counter tops, but still has to reach up with L hand when putting items up in cabinet or attempting to wash door. 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 PMH includes hx of C5-7 ACDF, HTN, HLD, psoriasis, and severe bil hand OA.  Per chart review: CT cervical spine showed fracture through the C4 vertebral body in the inferior anterior corner at C3. 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 MRI cervical spine again known C4 body and C3 anterior inferior corner fractures. 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 No visible injury to the posterior elements. C4-5 cord flattening primarily from central disc protrusion. Milder degenerative spinal stenosis C3-4. By foraminal impingement at C3-4 and C4-5. Left foraminal impingement of C7-T1 as well as C5-C7 ACDF.   PRECAUTIONS: Cervical collar for about 3 more weeks per surgeon  WEIGHT BEARING RESTRICTIONS No  PAIN: Are you having pain? No  PATIENT GOALS: "curl my hair"  OBJECTIVE:    UPPER EXTREMITY ROM     Active ROM Right Eval - 7/19 Left Eval - 7/19  Shoulder flexion 28 120  Shoulder abduction 36 89  Shoulder adduction    Shoulder extension    Shoulder internal rotation    Shoulder external rotation    Elbow flexion 135   Elbow extension 51   Wrist flexion    Wrist extension 53 64  Wrist ulnar deviation    Wrist radial deviation    Wrist pronation    Wrist supination    (Blank rows = not  tested)   TODAY'S TREATMENT:  Engaged in [place] and ROM in sitting at table while completing towel glides with 4# medicine ball on towel to increase resistance.  Pt completed 2 sets of 10 forward flexion with intermittent use of trunk to compensate for decreased shoulder flexion and elbow extension. Engaged in supine exercises with focus on increased shoulder flexion and decreased compensatory techniques.  Pt demonstrating improved shoulder activation and AAROM in supine, achieving shoulder flexion to 90* and ability to engage in  chest presses in supine.  Therapist providing intermittent tactile cues at shoulder and at dowel to further facilitate ROM and proper technique.  Therapist instructed pt in horizontal abduction/adduction with dowel.  Pt demonstrating good technique after initial cues and demonstration.   PATIENT EDUCATION: Educated on: HEP initiation  Person educated: Patient and Child(ren) Education method: Explanation Education comprehension: verbalized understanding   HOME EXERCISE PROGRAM: Access Code: [email or web address] URL: [email or web address] Date: 04/01/2022 Prepared by: [facility]  Exercises - Supine Shoulder Flexion with Dowel to 90  - 1 x daily - 3-4 x weekly - 2 sets - 10 reps - Supine Shoulder Press with Dowel  - 1 x daily - 3-4 x weekly - 2 sets - 10 reps - Supine Shoulder Horizontal Abduction Adduction AAROM with Dowel  - 1 x daily - 3-4 x weekly - 2 sets - 10 reps   GOALS: Goals reviewed with patient? Yes  SHORT TERM GOALS: Target date: 04/24/22    Status:  1 Pt will demonstrate independence w/ initial HEP designed for RUE ROM Baseline: To be administered Progressing  2 Pt to increase R shoulder flexion to at least 45 degrees for improved participation in functional tasks involving forward reach Baseline: 28* of shoulder flex Progressing  3 Pt to increase R elbow extension to at least 15 degrees for improved participation in functional tasks involving forward reach Baseline: 51* of elbow ext Progressing    LONG TERM GOALS: Target date: 05/24/22    Status:  1 Pt will increase strength in R arm to at least 4/5 MMT for improved functional ability to carry out self-care and higher-level homecare tasks with less difficulty Baseline: 2+/5 Progressing  2 Pt will improve grip strength in R hand to at least 20 lbs for functional use at home and in IADLs  Baseline: Unable to assess at evaluation Progressing  3 Pt will demonstrate ability to  complete hair care task w/ Mod Ind by d/c Baseline: Requires assist w/ hair care Progressing     ASSESSMENT:  CLINICAL IMPRESSION: Pt seen for first treatment post initial evaluation.  Pt reports continued attempts to engage in functional, household tasks, however notes compensatory techniques when attempting to reach into shoulder flexion.  Pt demonstrating improved shoulder ROM and activation in supine, due to improved support and stability decreasing compensatory techniques.  Pt provided with initial HEP, as pt did not bring previous exercises (from hospital).  PERFORMANCE DEFICITS in functional skills including sensation, ROM, strength, pain, mobility, balance, body mechanics, decreased knowledge of precautions, and UE functional use.  IMPAIRMENTS are limiting patient from ADLs, IADLs, rest and sleep, and leisure.   COMORBIDITIES may have co-morbidities  that affects occupational performance. Patient will benefit from skilled OT to address above impairments and improve overall function.  MODIFICATION OR ASSISTANCE TO COMPLETE EVALUATION: No modification of tasks or assist necessary to complete an evaluation.  OT OCCUPATIONAL PROFILE AND HISTORY: Problem focused assessment: Including review of records relating  to presenting problem.  CLINICAL DECISION MAKING: Moderate - several treatment options, min-mod task modification necessary  REHAB POTENTIAL: Good  EVALUATION COMPLEXITY: Moderate   PLAN: OT FREQUENCY: 1x/week  OT DURATION: 8 weeks  PLANNED INTERVENTIONS: self care/ADL training, therapeutic exercise, therapeutic activity, neuromuscular re-education, manual therapy, passive range of motion, functional mobility training, aquatic therapy, splinting, electrical stimulation, moist heat, cryotherapy, patient/family education, and DME and/or AE instructions  RECOMMENDED OTHER SERVICES: Currently receiving PT services at this location  CONSULTED AND AGREED WITH PLAN OF CARE:  Patient and family member/caregiver  PLAN FOR NEXT SESSION: Review HEP administered in hospital and progress/update exercises for R shoulder   Xiamara Hulet, OTR/L 04/01/2022, 3:56 PM

## 2022-04-01 NOTE — Therapy (Signed)
OUTPATIENT PHYSICAL THERAPY Treatment   Patient Name: Shelby Fleming MRN: [id number] DOB:[birth year], 76 y.o., female Today's Date: 04/01/2022   PCP: London Pepper REFERRING PROVIDER: Lauraine Rinne   PT End of Session - 04/01/22 1619     Visit Number 2    Number of Visits 6    Date for PT Re-Evaluation 05/20/22    Authorization Type U[employer] Medicare    PT Start Time 0488    PT Stop Time 1700    PT Time Calculation (min) 45 min    Equipment Utilized During Treatment Gait belt    Activity Tolerance Patient tolerated treatment well    Behavior During Therapy WFL for tasks assessed/performed             Past Medical History:  Diagnosis Date   Hypertension    Psoriasis    Past Surgical History:  Procedure Laterality Date   ABDOMINAL HYSTERECTOMY     ANTERIOR CERVICAL DECOMP/DISCECTOMY FUSION N/A 02/02/2022   Procedure: CERVICAL FOUR CORPECTOMY WITH PLATE REMOVAL;  Surgeon: Vallarie Mare, MD;  Location: [facility];  Service: Neurosurgery;  Laterality: N/A;   BACK SURGERY     Patient Active Problem List   Diagnosis Date Noted   Impaired gait and mobility 03/16/2022   Pressure injury of skin 02/12/2022   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   C4 cervical fracture ([place]) 01/31/2022   Closed cervical spine fracture ([place]) 01/31/2022    ONSET DATE: 02/02/22  REFERRING DIAG: Q91.694H (ICD-10-CM) - Central cord syndrome at unspecified level of cervical spinal cord, initial encounter   THERAPY DIAG:  Muscle weakness (generalized)  Unsteadiness on feet  Difficulty in walking, not elsewhere classified  Other abnormalities of gait and mobility  Rationale for Evaluation and Treatment Rehabilitation  SUBJECTIVE:                                                                                                                                                                                             SUBJECTIVE STATEMENT: Feeling pretty good, will see about using gym at church  for walking  Pt accompanied by: family member  PERTINENT HISTORY: 76 yr old female recent hx 02/02/22 of 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 No  PRECAUTIONS: Other: [place] collar , lifting restrictions (0#), no bending  WEIGHT BEARING RESTRICTIONS No  FALLS: Has patient fallen in last 6 months? Yes. Number of falls 1  LIVING ENVIRONMENT: Lives with: lives alone Lives in: House/apartment Stairs: Yes: External: 2 steps; on left going up Has following equipment at home: Gilford Rile - 2 wheeled Has walk-in shower, shower chair, BSC, bed rail  PLOF: [place] return to independence  OBJECTIVE:    TODAY'S TREATMENT: 04/01/22 Activity Comments  LE cycling x 5 min   Alt stair taps 6" box x 2 min 2.5#  Sidestepping x 2 min 2.5#  LAQ 3x10 2.5#  Treadmill x 5 min 0.8 mph CGA, cues for increased step length  Gait w/out AD level surfaces with supervision Negotiating turns/obstacles with supervision needed for occasional cues in safety     DIAGNOSTIC FINDINGS: imaging pre and post-op  COGNITION: Overall cognitive status: Within functional limits for tasks assessed   SENSATION: WFL  COORDINATION: WNL  EDEMA:  Wearing compression socks  MUSCLE TONE: WNL   DTRs:  DNT  POSTURE: No Significant postural limitations  LOWER EXTREMITY ROM:     WNL  LOWER EXTREMITY MMT:    Seated resisted tests reveal 4/5 gross strength BLE, no unilateral deficits noted  BED MOBILITY:  DNT  TRANSFERS: Assistive device utilized: None  Sit to stand: Complete Independence Stand to sit: Complete Independence Chair to chair: Complete Independence Floor:  DNT due to safety concerns of cervical precautions  RAMP:  Level of  Assistance: Modified independence Assistive device utilized: Walker - 2 wheeled Ramp Comments:   CURB:  Level of Assistance: CGA Assistive device utilized: Environmental consultant - 2 wheeled Curb Comments: noted to catch toe edge of step  STAIRS:  Level of Assistance: SBA and CGA  Stair Negotiation Technique: Alternating Pattern  with Single Rail on Left  Number of Stairs: 8   Height of Stairs: 4-6"  Comments:   GAIT: Gait pattern: WFL Distance walked:  Assistive device utilized: Environmental consultant - 2 wheeled and None Level of assistance: Modified independence Comments: slow, but steady  FUNCTIONAL TESTs:  5 times sit to stand: 15.45 sec Timed up and go (TUG): 21.09 sec w/out RW, slow/steady turn 10 meter walk test: 2.6 ft/sec Berg Balance Scale: 50/56  PATIENT SURVEYS:    TODAY'S TREATMENT:  assessment   PATIENT EDUCATION: Education details: assessment findings Person educated: Patient and Child(ren) Education method: Explanation Education comprehension: verbalized understanding   HOME EXERCISE PROGRAM: To be initiated    GOALS: Goals reviewed with patient? Yes  SHORT TERM GOALS: Target date: 04/15/2022  Patient will be independent in HEP to improve functional outcomes Baseline: Goal status: INITIAL  2.  Demonstrate improved balance and safety with gait per time 15 sec TUG test Baseline: 21 sec w/out AD Goal status: INITIAL    LONG TERM GOALS: Target date: 05/06/2022  The patient will improve gait speed to > or equal to 3.0 ft/sec and return to full community ambulator classification of gait Baseline: 2.6 ft/sec w/out AD Goal status: INITIAL  2.  Manifest low risk for falls per TUG test time 12 sec or better to improve safety with gait Baseline: 21.09 sec w/out AD Goal status: INITIAL  3.  Demonstrate/report ability to stand/ambulate x 30 min w/out need for rest period to improve activity tolerance for community outings/shopping Baseline: 5-10 min per report Goal status:  INITIAL  4.  Demonstrate modified independent stair ambulation while carrying bag to improve safety with home entry/exit Baseline: SBA-CGA Goal status: INITIAL    ASSESSMENT:  CLINICAL IMPRESSION: Initiated general strengthening and activity program today to improve functional activity tolerance with performance of standing open  chain movements for BLE strength and single limb support biased movements (stair tap/side step) to improve safety with maneuvering and maintaining balance.  Pt notes fatigue with ambulation on treadmill x 5 min. Continued sessions indicated to progress strength and endurance to improve safety with ambulation/community-level mobility   OBJECTIVE IMPAIRMENTS decreased activity tolerance, decreased balance, decreased endurance, decreased knowledge of use of DME, difficulty walking, and decreased strength.   ACTIVITY LIMITATIONS lifting, standing, squatting, stairs, and locomotion level  PARTICIPATION LIMITATIONS: meal prep, shopping, and community activity  PERSONAL FACTORS Age and Time since onset of injury/illness/exacerbation are also affecting patient's functional outcome.   REHAB POTENTIAL: Excellent  CLINICAL DECISION MAKING: Evolving/moderate complexity  EVALUATION COMPLEXITY: Moderate  PLAN: PT FREQUENCY: 1x/week  PT DURATION: 6 weeks  PLANNED INTERVENTIONS: Therapeutic exercises, Therapeutic activity, Neuromuscular re-education, Balance training, Gait training, Patient/Family education, Self Care, Joint mobilization, Stair training, Vestibular training, Canalith repositioning, DME instructions, Aquatic Therapy, Dry Needling, Electrical stimulation, and Manual therapy  PLAN FOR NEXT SESSION: establish HEP: turning activity, alt stair taps, single limb support activities, treadmill?   4:20 PM, 04/01/22 M. Sherlyn Lees, PT, DPT Physical Therapist- [place] Office Number: [phone]

## 2022-04-08 ENCOUNTER — Encounter: Payer: Self-pay | Admitting: Occupational Therapy

## 2022-04-08 ENCOUNTER — Ambulatory Visit: Payer: Medicare Other | Admitting: Occupational Therapy

## 2022-04-08 ENCOUNTER — Ambulatory Visit: Payer: Medicare Other | Attending: Physician Assistant

## 2022-04-08 DIAGNOSIS — R208 Other disturbances of skin sensation: Secondary | ICD-10-CM | POA: Insufficient documentation

## 2022-04-08 DIAGNOSIS — M6281 Muscle weakness (generalized): Secondary | ICD-10-CM | POA: Diagnosis not present

## 2022-04-08 DIAGNOSIS — R262 Difficulty in walking, not elsewhere classified: Secondary | ICD-10-CM | POA: Diagnosis not present

## 2022-04-08 DIAGNOSIS — R2689 Other abnormalities of gait and mobility: Secondary | ICD-10-CM | POA: Diagnosis not present

## 2022-04-08 DIAGNOSIS — R2681 Unsteadiness on feet: Secondary | ICD-10-CM | POA: Diagnosis not present

## 2022-04-08 DIAGNOSIS — M25611 Stiffness of right shoulder, not elsewhere classified: Secondary | ICD-10-CM | POA: Diagnosis not present

## 2022-04-08 NOTE — Therapy (Signed)
OUTPATIENT PHYSICAL THERAPY Treatment   Patient Name: Shelby Fleming MRN: [id number] DOB:[birth year], 76 y.o., female Today's Date: 04/08/2022   PCP: London Pepper REFERRING PROVIDER: Lauraine Rinne   PT End of Session - 04/08/22 1535     Visit Number 3    Number of Visits 6    Date for PT Re-Evaluation 05/20/22    Authorization Type UHC Medicare    PT Start Time 0272    PT Stop Time 1615    PT Time Calculation (min) 45 min    Equipment Utilized During Treatment Gait belt    Activity Tolerance Patient tolerated treatment well    Behavior During Therapy WFL for tasks assessed/performed             Past Medical History:  Diagnosis Date   Hypertension    Psoriasis    Past Surgical History:  Procedure Laterality Date   ABDOMINAL HYSTERECTOMY     ANTERIOR CERVICAL DECOMP/DISCECTOMY FUSION N/A 02/02/2022   Procedure: CERVICAL FOUR CORPECTOMY WITH PLATE REMOVAL;  Surgeon: Vallarie Mare, MD;  Location: [facility];  Service: Neurosurgery;  Laterality: N/A;   BACK SURGERY     Patient Active Problem List   Diagnosis Date Noted   Impaired gait and mobility 03/16/2022   Pressure injury of skin 02/12/2022   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   C4 cervical fracture ([place]) 01/31/2022   Closed cervical spine fracture ([place]) 01/31/2022    ONSET DATE: 02/02/22  REFERRING DIAG: Z36.644I (ICD-10-CM) - Central cord syndrome at unspecified level of cervical spinal cord, initial encounter   THERAPY DIAG:  Muscle weakness (generalized)  Unsteadiness on feet  Difficulty in walking, not elsewhere classified  Other abnormalities of gait and mobility  Rationale for Evaluation and Treatment Rehabilitation  SUBJECTIVE:                                                                                                                                                                                             SUBJECTIVE STATEMENT: Very tired after last session, no soreness, just  fatigue  Pt accompanied by: family member  PERTINENT HISTORY: 76 yr old female recent hx 02/02/22 of 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 No  PRECAUTIONS: Other: [place] collar , lifting restrictions (0#), no bending  WEIGHT BEARING RESTRICTIONS No  FALLS: Has patient fallen in last 6 months? Yes. Number of falls 1  LIVING ENVIRONMENT: Lives  with: lives alone Lives in: House/apartment Stairs: Yes: External: 2 steps; on left going up Has following equipment at home: Gilford Rile - 2 wheeled Has walk-in shower, shower chair, BSC, bed rail  PLOF: [place] return to independence  OBJECTIVE:   TODAY'S TREATMENT: 04/08/22 Activity Comments  Gait training Treadmill 2x5 min building to 1.0-1.2 mph, cues for stride length. 2 min rest period between trials; 11/20 Borg RPE. Gait w/ RW for negotiating obstacles and turns  Balance activities Standing on foam EO/EC 6x15 sec. Partial tandem 3x30 sec On firm: tandem 2x30, single leg stance 3x R/L 12-25 sec Retro-walking 4x25 ft CGA                    DIAGNOSTIC FINDINGS: imaging pre and post-op  COGNITION: Overall cognitive status: Within functional limits for tasks assessed   SENSATION: WFL  COORDINATION: WNL  EDEMA:  Wearing compression socks  MUSCLE TONE: WNL   DTRs:  DNT  POSTURE: No Significant postural limitations  LOWER EXTREMITY ROM:     WNL  LOWER EXTREMITY MMT:    Seated resisted tests reveal 4/5 gross strength BLE, no unilateral deficits noted  BED MOBILITY:  DNT  TRANSFERS: Assistive device utilized: None  Sit to stand: Complete Independence Stand to sit: Complete Independence Chair to chair: Complete Independence Floor:  DNT due to  safety concerns of cervical precautions  RAMP:  Level of Assistance: Modified independence Assistive device utilized: Walker - 2 wheeled Ramp Comments:   CURB:  Level of Assistance: CGA Assistive device utilized: Environmental consultant - 2 wheeled Curb Comments: noted to catch toe edge of step  STAIRS:  Level of Assistance: SBA and CGA  Stair Negotiation Technique: Alternating Pattern  with Single Rail on Left  Number of Stairs: 8   Height of Stairs: 4-6"  Comments:   GAIT: Gait pattern: WFL Distance walked:  Assistive device utilized: Environmental consultant - 2 wheeled and None Level of assistance: Modified independence Comments: slow, but steady  FUNCTIONAL TESTs:  5 times sit to stand: 15.45 sec Timed up and go (TUG): 21.09 sec w/out RW, slow/steady turn 10 meter walk test: 2.6 ft/sec Berg Balance Scale: 50/56  PATIENT SURVEYS:    TODAY'S TREATMENT:  assessment   PATIENT EDUCATION: Education details: assessment findings Person educated: Patient and Child(ren) Education method: Explanation Education comprehension: verbalized understanding   HOME EXERCISE PROGRAM: To be initiated    GOALS: Goals reviewed with patient? Yes  SHORT TERM GOALS: Target date: 04/15/2022  Patient will be independent in HEP to improve functional outcomes Baseline: Goal status: INITIAL  2.  Demonstrate improved balance and safety with gait per time 15 sec TUG test Baseline: 21 sec w/out AD Goal status: INITIAL    LONG TERM GOALS: Target date: 05/06/2022  The patient will improve gait speed to > or equal to 3.0 ft/sec and return to full community ambulator classification of gait Baseline: 2.6 ft/sec w/out AD Goal status: INITIAL  2.  Manifest low risk for falls per TUG test time 12 sec or better to improve safety with gait Baseline: 21.09 sec w/out AD Goal status: INITIAL  3.  Demonstrate/report ability to stand/ambulate x 30 min w/out need for rest period to improve activity tolerance for community  outings/shopping Baseline: 5-10 min per report Goal status: INITIAL  4.  Demonstrate modified independent stair ambulation while carrying bag to improve safety with home entry/exit Baseline: SBA-CGA Goal status: INITIAL    ASSESSMENT:  CLINICAL IMPRESSION: Progressing quite well and reports  fatigue x 1-2 days after last session but overall feeling improved today.  Able to increase walking speed and time on treadmill today without exertion exceeding 11/20 Borg RPE. Demonstrating good static standing capabilities on firm and compliant surfaces. Progress to walking with increased speed and incline if capable   OBJECTIVE IMPAIRMENTS decreased activity tolerance, decreased balance, decreased endurance, decreased knowledge of use of DME, difficulty walking, and decreased strength.   ACTIVITY LIMITATIONS lifting, standing, squatting, stairs, and locomotion level  PARTICIPATION LIMITATIONS: meal prep, shopping, and community activity  PERSONAL FACTORS Age and Time since onset of injury/illness/exacerbation are also affecting patient's functional outcome.   REHAB POTENTIAL: Excellent  CLINICAL DECISION MAKING: Evolving/moderate complexity  EVALUATION COMPLEXITY: Moderate  PLAN: PT FREQUENCY: 1x/week  PT DURATION: 6 weeks  PLANNED INTERVENTIONS: Therapeutic exercises, Therapeutic activity, Neuromuscular re-education, Balance training, Gait training, Patient/Family education, Self Care, Joint mobilization, Stair training, Vestibular training, Canalith repositioning, DME instructions, Aquatic Therapy, Dry Needling, Electrical stimulation, and Manual therapy  PLAN FOR NEXT SESSION: stair taps, LE strengthening, treadmill for speed/incline   3:36 PM, 04/08/22 M. Sherlyn Lees, PT, DPT Physical Therapist- [place] Office Number: [phone]

## 2022-04-08 NOTE — Patient Instructions (Signed)
Shoulder External Rotation    Stand, right arm bent to 90, elbow against side with the pillow under your arm. While holding a 16 oz. can of soup (1 lb.), rotate forearm outward, keeping elbow at side. Rotate forearm outward as far as possible. Complete   2   sets of   15   repetitions. Perform   2-3   sessions per day.   Scapular Retraction    With arms at sides, pinch shoulder blades together. Complete   2   sets of   15   repetitions. Perform   2-3   sessions per day.

## 2022-04-08 NOTE — Therapy (Signed)
OUTPATIENT OCCUPATIONAL THERAPY TREATMENT NOTE  Patient Name: Shelby Fleming MRN: [id number] DOB:1946/01/23, 76 y.o., female Today's Date: 04/08/2022  PCP: London Pepper, MD REFERRING PROVIDER: Cathlyn Parsons, PA-C   OT End of Session - 04/08/22 1629     Visit Number 3    Number of Visits 9    Date for OT Re-Evaluation 05/24/22    Authorization Type UHC Medicare    Authorization Time Period VL: [place]    OT Start Time 1618    OT Stop Time 1702    OT Time Calculation (min) 44 min    Activity Tolerance Patient tolerated treatment well    Behavior During Therapy WFL for tasks assessed/performed            Past Medical History:  Diagnosis Date   Hypertension    Psoriasis    Past Surgical History:  Procedure Laterality Date   ABDOMINAL HYSTERECTOMY     ANTERIOR CERVICAL DECOMP/DISCECTOMY FUSION N/A 02/02/2022   Procedure: CERVICAL FOUR CORPECTOMY WITH PLATE REMOVAL;  Surgeon: Vallarie Mare, MD;  Location: [place] [facility];  Service: Neurosurgery;  Laterality: N/A;   BACK SURGERY     Patient Active Problem List   Diagnosis Date Noted   Impaired gait and mobility 03/16/2022   Pressure injury of skin 02/12/2022   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   C4 cervical fracture ([place]) 01/31/2022   Closed cervical spine fracture ([place]) 01/31/2022    ONSET DATE: 01/31/22  REFERRING DIAG: D98.338S (ICD-10-CM) - Central cord syndrome at unspecified level of cervical spinal cord, initial encounter   THERAPY DIAG:  Stiffness of right shoulder, not elsewhere classified  Muscle weakness (generalized)  Other disturbances of skin sensation  Other abnormalities of gait and mobility  Rationale for Evaluation and Treatment Rehabilitation  SUBJECTIVE:   SUBJECTIVE STATEMENT: Pt reports she has a follow-up w/ her surgeon next Friday (04/17/22) Pt accompanied by: self  PERTINENT HISTORY: 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 PMH includes hx of C5-7 ACDF, HTN, HLD, psoriasis, and severe bil hand OA.  Per chart review: CT cervical spine showed fracture through the C4 vertebral body in the inferior anterior corner at C3. 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 MRI cervical spine again known C4 body and C3 anterior inferior corner fractures. 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 No visible injury to the posterior elements. C4-5 cord flattening primarily from central disc protrusion. Milder degenerative spinal stenosis C3-4. By foraminal impingement at C3-4 and C4-5. Left foraminal impingement of C7-T1 as well as C5-C7 ACDF.   PRECAUTIONS: Cervical collar for about 3 more weeks per surgeon; follow-up 04/17/22  WEIGHT BEARING RESTRICTIONS No  PAIN: Are you having pain? No  PATIENT GOALS: "curl my hair"  OBJECTIVE:    UPPER EXTREMITY ROM     Active ROM Left Eval - 7/19 Right Eval - 7/19  Shoulder flexion 120 28  Shoulder abduction 89 36  Shoulder adduction    Shoulder extension    Shoulder internal rotation    Shoulder external rotation    Elbow flexion  135  Elbow extension  51  Wrist flexion    Wrist extension 64 53  Wrist ulnar deviation    Wrist radial deviation    Wrist pronation    Wrist supination    (Blank rows = not tested)   TODAY'S TREATMENT:  Towel Slides Towel slides on tabletop while seated w/ BUEs for R shoulder flex/elbow ext to facilitate increased ROM and stretch; OT provided initial verbal cues to improve shoulder alignment during exercise   Light UB Strengthening R shoulder ER w/ arm adducted (pillow positioned under upper arm) and elbow flexed, holding 1lb dumbbell for slight resistance, while seated upright; 2x15 w/ OT providing verbal/tactile cues for consistent arc of motion. Incorporated mirror as visual aid to decrease compensatory  patterns  Elbow extension w/ gravity-assist while seated upright in chair, focusing on triceps activation each rep; completed 2x15 w/ OT providing occ incr resistance to mid-range w/ good results.  Scapular retraction while seated upright 2x15 w/ OT incorporating mirror as visual aid to prevent compensatory patterns and verbal/tactile cues for consistent muscle activation each rep. Able to complete w/out pain.    PATIENT EDUCATION: Ongoing condition-specific education related to therapeutic interventions completed this session; also discussed shoulder anatomy and benefit of scapular stabilization and strengthening in conjunction w/ focus on ROM Person educated: Patient and Child(ren) Education method: Explanation Education comprehension: verbalized understanding   HOME EXERCISE PROGRAM: See patient instructions - shoulder ER; scapular retraction  MedBridge Code: [email or web address] URL: [email or web address] Date: 04/01/2022 Prepared by: [facility]  Exercises - Supine Shoulder Flexion with Dowel to 90  - 1 x daily - 3-4 x weekly - 2 sets - 10 reps - Supine Shoulder Press with Dowel  - 1 x daily - 3-4 x weekly - 2 sets - 10 reps - Supine Shoulder Horizontal Abduction Adduction AAROM with Dowel  - 1 x daily - 3-4 x weekly - 2 sets - 10 reps   GOALS: Goals reviewed with patient? Yes  SHORT TERM GOALS: Target date: 04/24/22    Status:  1 Pt will demonstrate independence w/ initial HEP designed for RUE ROM Baseline: To be administered Progressing  2 Pt to increase R shoulder flexion to at least 45 degrees for improved participation in functional tasks involving forward reach Baseline: 28* of shoulder flex Progressing  3 Pt to increase R elbow extension to at least 15 degrees for improved participation in functional tasks involving forward reach Baseline: 51* of elbow ext Progressing    LONG TERM GOALS: Target date: 05/24/22    Status:  1  Pt will increase strength in R arm to at least 4/5 MMT for improved functional ability to carry out self-care and higher-level homecare tasks with less difficulty Baseline: 2+/5 Progressing  2 Pt will improve grip strength in R hand to at least 20 lbs for functional use at home and in IADLs  Baseline: Unable to assess at evaluation Progressing  3 Pt will demonstrate ability to complete hair care task w/ Mod Ind by d/c Baseline: Requires assist w/ hair care Progressing     ASSESSMENT:  CLINICAL IMPRESSION: Pt reports continued attempts to engage in functional household tasks and is really focusing on preventing compensatory patterns at shoulder. OT encouraged pt to also focus on targeted HEP exercises, reiterating benefit of supine positioning due to improved support and stability decreasing compensatory patterns. Scapular stability exercises included into HEP this session w/ pt able to return demonstration of w/ good technique and understanding. OT was also able to review HEP administered in the hospital w/ exercises mostly addressing balance and hand/grip strengthening.   PERFORMANCE DEFICITS in functional skills including sensation, ROM, strength, pain, mobility, balance, body mechanics, decreased knowledge of precautions, and UE functional use.  IMPAIRMENTS are limiting  patient from ADLs, IADLs, rest and sleep, and leisure.   COMORBIDITIES may have co-morbidities  that affects occupational performance. Patient will benefit from skilled OT to address above impairments and improve overall function.   PLAN: OT FREQUENCY: 1x/week  OT DURATION: 8 weeks  PLANNED INTERVENTIONS: self care/ADL training, therapeutic exercise, therapeutic activity, neuromuscular re-education, manual therapy, passive range of motion, functional mobility training, aquatic therapy, splinting, electrical stimulation, moist heat, cryotherapy, patient/family education, and DME and/or AE instructions  RECOMMENDED OTHER  SERVICES: Currently receiving PT services at this location  CONSULTED AND AGREED WITH PLAN OF CARE: Patient and family member/caregiver  PLAN FOR NEXT SESSION: Reassess R shoulder and elbow ROM; continue w/ scapular stabilization and supine exercises - progress/update exercises for R shoulder   Kathrine Cords, OTR/L 04/08/2022, 5:43 PM

## 2022-04-15 ENCOUNTER — Ambulatory Visit: Payer: Medicare Other | Admitting: Occupational Therapy

## 2022-04-15 ENCOUNTER — Ambulatory Visit: Payer: Medicare Other

## 2022-04-15 DIAGNOSIS — R2689 Other abnormalities of gait and mobility: Secondary | ICD-10-CM

## 2022-04-15 DIAGNOSIS — M6281 Muscle weakness (generalized): Secondary | ICD-10-CM

## 2022-04-15 DIAGNOSIS — R2681 Unsteadiness on feet: Secondary | ICD-10-CM

## 2022-04-15 DIAGNOSIS — R208 Other disturbances of skin sensation: Secondary | ICD-10-CM | POA: Diagnosis not present

## 2022-04-15 DIAGNOSIS — M25611 Stiffness of right shoulder, not elsewhere classified: Secondary | ICD-10-CM

## 2022-04-15 DIAGNOSIS — R262 Difficulty in walking, not elsewhere classified: Secondary | ICD-10-CM

## 2022-04-15 NOTE — Therapy (Signed)
OUTPATIENT PHYSICAL THERAPY Treatment   Patient Name: Shelby Fleming MRN: [id number] DOB:November 22, 1945, 76 y.o., female Today's Date: 04/15/2022   PCP: London Pepper REFERRING PROVIDER: Lauraine Rinne   PT End of Session - 04/15/22 1632     Visit Number 4    Number of Visits 6    Date for PT Re-Evaluation 05/20/22    Authorization Type UHC Medicare    PT Start Time 6789    PT Stop Time 1700    PT Time Calculation (min) 45 min    Equipment Utilized During Treatment Gait belt    Activity Tolerance Patient tolerated treatment well    Behavior During Therapy WFL for tasks assessed/performed             Past Medical History:  Diagnosis Date   Hypertension    Psoriasis    Past Surgical History:  Procedure Laterality Date   ABDOMINAL HYSTERECTOMY     ANTERIOR CERVICAL DECOMP/DISCECTOMY FUSION N/A 02/02/2022   Procedure: CERVICAL FOUR CORPECTOMY WITH PLATE REMOVAL;  Surgeon: Vallarie Mare, MD;  Location: [place];  Service: Neurosurgery;  Laterality: N/A;   BACK SURGERY     Patient Active Problem List   Diagnosis Date Noted   Impaired gait and mobility 03/16/2022   Pressure injury of skin 02/12/2022   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   C4 cervical fracture ([place]) 01/31/2022   Closed cervical spine fracture ([place]) 01/31/2022    ONSET DATE: 02/02/22  REFERRING DIAG: F81.017P (ICD-10-CM) - Central cord syndrome at unspecified level of cervical spinal cord, initial encounter   THERAPY DIAG:  Muscle weakness (generalized)  Other abnormalities of gait and mobility  Unsteadiness on feet  Difficulty in walking, not elsewhere classified  Rationale for Evaluation and Treatment Rehabilitation  SUBJECTIVE:                                                                                                                                                                                             SUBJECTIVE STATEMENT: Feeling pretty good, making progress  Pt accompanied by:  family member  PERTINENT HISTORY: 76 yr old female recent hx 02/02/22 of 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 No  PRECAUTIONS: Other: [place] collar , lifting restrictions (0#), no bending  WEIGHT BEARING RESTRICTIONS No  FALLS: Has patient fallen in last 6 months? Yes. Number of falls 1  LIVING ENVIRONMENT: Lives with: lives alone Lives  in: House/apartment Stairs: Yes: External: 2 steps; on left going up Has following equipment at home: Walker - 2 wheeled Has walk-in shower, shower chair, BSC, bed rail  PLOF: [place] return to independence  OBJECTIVE:    TODAY'S TREATMENT: 04/15/22 Activity Comments  Treadmill x 10 min 1.2-1.5 mph with ability to complete in one trial. Rates 9/20 Borg Exertion  TUG test 17.25 sec w/out AD  Sidestepping, retrowalk, tandem walk 2x25 ft                 DIAGNOSTIC FINDINGS: imaging pre and post-op  COGNITION: Overall cognitive status: Within functional limits for tasks assessed   SENSATION: WFL  COORDINATION: WNL  EDEMA:  Wearing compression socks  MUSCLE TONE: WNL   DTRs:  DNT  POSTURE: No Significant postural limitations  LOWER EXTREMITY ROM:     WNL  LOWER EXTREMITY MMT:    Seated resisted tests reveal 4/5 gross strength BLE, no unilateral deficits noted  BED MOBILITY:  DNT  TRANSFERS: Assistive device utilized: None  Sit to stand: Complete Independence Stand to sit: Complete Independence Chair to chair: Complete Independence Floor:  DNT due to safety concerns of cervical precautions  RAMP:  Level of Assistance: Modified independence Assistive device utilized: Walker - 2 wheeled Ramp Comments:   CURB:  Level of Assistance: CGA Assistive  device utilized: Environmental consultant - 2 wheeled Curb Comments: noted to catch toe edge of step  STAIRS:  Level of Assistance: SBA and CGA  Stair Negotiation Technique: Alternating Pattern  with Single Rail on Left  Number of Stairs: 8   Height of Stairs: 4-6"  Comments:   GAIT: Gait pattern: WFL Distance walked:  Assistive device utilized: Environmental consultant - 2 wheeled and None Level of assistance: Modified independence Comments: slow, but steady  FUNCTIONAL TESTs:  5 times sit to stand: 15.45 sec Timed up and go (TUG): 21.09 sec w/out RW, slow/steady turn 10 meter walk test: 2.6 ft/sec Berg Balance Scale: 50/56  PATIENT SURVEYS:    TODAY'S TREATMENT:  assessment   PATIENT EDUCATION: Education details: assessment findings Person educated: Patient and Child(ren) Education method: Explanation Education comprehension: verbalized understanding   HOME EXERCISE PROGRAM: To be initiated    GOALS: Goals reviewed with patient? Yes  SHORT TERM GOALS: Target date: 04/15/2022  Patient will be independent in HEP to improve functional outcomes Baseline: Goal status: INITIAL  2.  Demonstrate improved balance and safety with gait per time 15 sec TUG test Baseline: 21 sec w/out AD; (04/15/22) 17.24 sec Goal status: On-going    LONG TERM GOALS: Target date: 05/06/2022  The patient will improve gait speed to > or equal to 3.0 ft/sec and return to full community ambulator classification of gait Baseline: 2.6 ft/sec w/out AD Goal status: INITIAL  2.  Manifest low risk for falls per TUG test time 12 sec or better to improve safety with gait Baseline: 21.09 sec w/out AD Goal status: INITIAL  3.  Demonstrate/report ability to stand/ambulate x 30 min w/out need for rest period to improve activity tolerance for community outings/shopping Baseline: 5-10 min per report Goal status: INITIAL  4.  Demonstrate modified independent stair ambulation while carrying bag to improve safety with home  entry/exit Baseline: SBA-CGA Goal status: INITIAL    ASSESSMENT:  CLINICAL IMPRESSION: Treatment session with emphasis on improving ambulation tolerance and safety by means of different drills to encourage increased stride and safety with turning.  Demo improved gait tolerance/endurance as evidenced by ability to ambulate continuously  on treadmill x 10 min and rates at a lesser degree via Borg RPE as well. Demo reduced time for TUG test and without AD. Continued sessions to progress balance, gait, coordination to reduce risk for falls and improve gait tolerance   OBJECTIVE IMPAIRMENTS decreased activity tolerance, decreased balance, decreased endurance, decreased knowledge of use of DME, difficulty walking, and decreased strength.   ACTIVITY LIMITATIONS lifting, standing, squatting, stairs, and locomotion level  PARTICIPATION LIMITATIONS: meal prep, shopping, and community activity  PERSONAL FACTORS Age and Time since onset of injury/illness/exacerbation are also affecting patient's functional outcome.   REHAB POTENTIAL: Excellent  CLINICAL DECISION MAKING: Evolving/moderate complexity  EVALUATION COMPLEXITY: Moderate  PLAN: PT FREQUENCY: 1x/week  PT DURATION: 6 weeks  PLANNED INTERVENTIONS: Therapeutic exercises, Therapeutic activity, Neuromuscular re-education, Balance training, Gait training, Patient/Family education, Self Care, Joint mobilization, Stair training, Vestibular training, Canalith repositioning, DME instructions, Aquatic Therapy, Dry Needling, Electrical stimulation, and Manual therapy  PLAN FOR NEXT SESSION: stair taps, LE strengthening, treadmill for speed/incline   4:32 PM, 04/15/22 M. Sherlyn Lees, PT, DPT Physical Therapist- [place] Office Number: [phone]

## 2022-04-15 NOTE — Therapy (Signed)
OUTPATIENT OCCUPATIONAL THERAPY TREATMENT NOTE  Patient Name: Shelby Fleming MRN: [id number] DOB:Jan 24, 1946, 76 y.o., female Today's Date: 04/15/2022  PCP: London Pepper, MD REFERRING PROVIDER: Cathlyn Parsons, PA-C   OT End of Session - 04/15/22 1541     Visit Number 4    Number of Visits 9    Date for OT Re-Evaluation 05/24/22    Authorization Type UHC Medicare    Authorization Time Period VL: [place]    OT Start Time 1537    OT Stop Time 1619    OT Time Calculation (min) 42 min    Activity Tolerance Patient tolerated treatment well    Behavior During Therapy WFL for tasks assessed/performed             Past Medical History:  Diagnosis Date   Hypertension    Psoriasis    Past Surgical History:  Procedure Laterality Date   ABDOMINAL HYSTERECTOMY     ANTERIOR CERVICAL DECOMP/DISCECTOMY FUSION N/A 02/02/2022   Procedure: CERVICAL FOUR CORPECTOMY WITH PLATE REMOVAL;  Surgeon: Vallarie Mare, MD;  Location: [place];  Service: Neurosurgery;  Laterality: N/A;   BACK SURGERY     Patient Active Problem List   Diagnosis Date Noted   Impaired gait and mobility 03/16/2022   Pressure injury of skin 02/12/2022   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   C4 cervical fracture ([place]) 01/31/2022   Closed cervical spine fracture ([place]) 01/31/2022    ONSET DATE: 01/31/22  REFERRING DIAG: I33.825K (ICD-10-CM) - Central cord syndrome at unspecified level of cervical spinal cord, initial encounter   THERAPY DIAG:  Muscle weakness (generalized)  Stiffness of right shoulder, not elsewhere classified  Other disturbances of skin sensation  Other abnormalities of gait and mobility  Rationale for Evaluation and Treatment Rehabilitation  SUBJECTIVE:   SUBJECTIVE STATEMENT: Pt reports she has been doing some household tasks such as dusting and washing dishes. 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 PMH includes hx of C5-7 ACDF, HTN, HLD, psoriasis, and severe bil hand OA.  Per chart review: CT cervical spine showed fracture through the C4 vertebral body in the inferior anterior corner at C3. 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 MRI cervical spine again known C4 body and C3 anterior inferior corner fractures. 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 No visible injury to the posterior elements. C4-5 cord flattening primarily from central disc protrusion. Milder degenerative spinal stenosis C3-4. By foraminal impingement at C3-4 and C4-5. Left foraminal impingement of C7-T1 as well as C5-C7 ACDF.   PRECAUTIONS: Cervical collar for about 3 more weeks per surgeon; follow-up 04/17/22  WEIGHT BEARING RESTRICTIONS No  PAIN: Are you having pain? No  PATIENT GOALS: "curl my hair"  OBJECTIVE:    UPPER EXTREMITY ROM     Active ROM Left Eval - 7/19 Right Eval - 7/19 Right 04/15/22  Shoulder flexion 120 28 42  Shoulder abduction 89 36   Shoulder adduction     Shoulder extension     Shoulder internal rotation     Shoulder external rotation     Elbow flexion  135   Elbow extension  51 -8  Wrist flexion     Wrist extension 64 53   Wrist ulnar deviation     Wrist radial deviation     Wrist pronation  Wrist supination     (Blank rows = not tested)   TODAY'S TREATMENT:   Reviewed hemi-dressing technique with UB dressing as pt reports continued difficulties with donning bra. Discussed adaptive techniques and recommended trial of threading LUE prior to R to compensate for decreased RUE horizontal abduction and reach across midline vs threading RUE first.   Reviewed wear of compression stockings as pt reports decreased edema and no longer taking a blood thinner.  Discussed typical progression regarding wear of progression stockings  and recommendations to ask MD.  Therapist reiterated recommendations for intermittent mobility and elevation of BLE when at rest.   Attempted to doff and don compression socks as pt reports difficulty due to decreased grip strength and arthritis impacting strength.  Therapist providing cues on alternative techniques, however continuing to have difficulty due to decreased pinch and grip strength.  Attempted to don with sock aid.  Therapist placed sock on sock aid due to decreased strength to don sock.  Pt then required assistance to pull sock aid over heel of foot due to decreased strength.  Pt able to don and tie shoe in figure 4 position without assistance. Took measurements (see above) and reviewed HEP for carryover to functional tasks with improved reach and dressing tasks.    PATIENT EDUCATION: Ongoing condition-specific education related to therapeutic interventions completed this session; also discussed shoulder anatomy and benefit of scapular stabilization and strengthening in conjunction w/ focus on ROM Person educated: Patient and Child(ren) Education method: Explanation Education comprehension: verbalized understanding   HOME EXERCISE PROGRAM: See patient instructions - shoulder ER; scapular retraction  MedBridge Code: [email or web address] URL: [email or web address] Date: 04/01/2022 Prepared by: [facility]  Exercises - Supine Shoulder Flexion with Dowel to 90  - 1 x daily - 3-4 x weekly - 2 sets - 10 reps - Supine Shoulder Press with Dowel  - 1 x daily - 3-4 x weekly - 2 sets - 10 reps - Supine Shoulder Horizontal Abduction Adduction AAROM with Dowel  - 1 x daily - 3-4 x weekly - 2 sets - 10 reps   GOALS: Goals reviewed with patient? Yes  SHORT TERM GOALS: Target date: 04/24/22    Status:  1 Pt will demonstrate independence w/ initial HEP designed for RUE ROM Baseline: To be administered Progressing  2 Pt to increase R shoulder  flexion to at least 45 degrees for improved participation in functional tasks involving forward reach Baseline: 28* of shoulder flex Progressing  42* on 04/15/22  3 Pt to increase R elbow extension to at least 15 degrees for improved participation in functional tasks involving forward reach Baseline: 51* of elbow ext Progressing -8* from neutral on 04/15/22    LONG TERM GOALS: Target date: 05/24/22    Status:  1 Pt will increase strength in R arm to at least 4/5 MMT for improved functional ability to carry out self-care and higher-level homecare tasks with less difficulty Baseline: 2+/5 Progressing  2 Pt will improve grip strength in R hand to at least 20 lbs for functional use at home and in IADLs  Baseline: Unable to assess at evaluation Progressing  3 Pt will demonstrate ability to complete hair care task w/ Mod Ind by d/c Baseline: Requires assist w/ hair care Progressing     ASSESSMENT:  CLINICAL IMPRESSION: Pt reports continued attempts to engage in functional household tasks and is really focusing on preventing compensatory patterns at shoulder. OT encouraged pt to also focus on  targeted HEP exercises, reiterating benefit of supine positioning due to improved support and stability decreasing compensatory patterns as well as scapular stability exercises and functional carryover of each.  Pt pleased with improved shoulder and elbow ROM, reports even noticing increased ability to engage in household tasks and dressing.  Pt continues to report difficulty with donning bra and compression socks.  Engaged in problem solving of both with use of adaptive techniques and AE, pt to attempt recommendations at home.  PERFORMANCE DEFICITS in functional skills including sensation, ROM, strength, pain, mobility, balance, body mechanics, decreased knowledge of precautions, and UE functional use.  IMPAIRMENTS are limiting patient from ADLs, IADLs, rest and sleep, and leisure.   COMORBIDITIES may have  co-morbidities  that affects occupational performance. Patient will benefit from skilled OT to address above impairments and improve overall function.   PLAN: OT FREQUENCY: 1x/week  OT DURATION: 8 weeks  PLANNED INTERVENTIONS: self care/ADL training, therapeutic exercise, therapeutic activity, neuromuscular re-education, manual therapy, passive range of motion, functional mobility training, aquatic therapy, splinting, electrical stimulation, moist heat, cryotherapy, patient/family education, and DME and/or AE instructions  RECOMMENDED OTHER SERVICES: Currently receiving PT services at this location  CONSULTED AND AGREED WITH PLAN OF CARE: Patient and family member/caregiver  PLAN FOR NEXT SESSION: continue w/ scapular stabilization and supine exercises - progress/update exercises for R shoulder; address functional mobility and adaptive techniques with dressing tasks.   Simonne Come, OTR/L 04/15/2022, 4:35 PM

## 2022-04-17 DIAGNOSIS — G959 Disease of spinal cord, unspecified: Secondary | ICD-10-CM | POA: Diagnosis not present

## 2022-04-22 ENCOUNTER — Ambulatory Visit: Payer: Medicare Other | Admitting: Occupational Therapy

## 2022-04-22 ENCOUNTER — Ambulatory Visit: Payer: Medicare Other

## 2022-04-22 ENCOUNTER — Encounter: Payer: Self-pay | Admitting: Occupational Therapy

## 2022-04-22 DIAGNOSIS — R2681 Unsteadiness on feet: Secondary | ICD-10-CM | POA: Diagnosis not present

## 2022-04-22 DIAGNOSIS — M25611 Stiffness of right shoulder, not elsewhere classified: Secondary | ICD-10-CM | POA: Diagnosis not present

## 2022-04-22 DIAGNOSIS — R2689 Other abnormalities of gait and mobility: Secondary | ICD-10-CM

## 2022-04-22 DIAGNOSIS — R262 Difficulty in walking, not elsewhere classified: Secondary | ICD-10-CM | POA: Diagnosis not present

## 2022-04-22 DIAGNOSIS — R208 Other disturbances of skin sensation: Secondary | ICD-10-CM | POA: Diagnosis not present

## 2022-04-22 DIAGNOSIS — M6281 Muscle weakness (generalized): Secondary | ICD-10-CM

## 2022-04-22 NOTE — Therapy (Signed)
OUTPATIENT PHYSICAL THERAPY Treatment   Patient Name: Shelby Fleming MRN: [id number] DOB:25-Dec-1945, 76 y.o., female Today's Date: 04/22/2022   PCP: London Pepper REFERRING PROVIDER: Lauraine Rinne   PT End of Session - 04/22/22 1535     Visit Number 5    Number of Visits 6    Date for PT Re-Evaluation 05/20/22    Authorization Type U[employer] Medicare    PT Start Time 4818    PT Stop Time 1615    PT Time Calculation (min) 45 min    Equipment Utilized During Treatment Gait belt    Activity Tolerance Patient tolerated treatment well    Behavior During Therapy WFL for tasks assessed/performed             Past Medical History:  Diagnosis Date   Hypertension    Psoriasis    Past Surgical History:  Procedure Laterality Date   ABDOMINAL HYSTERECTOMY     ANTERIOR CERVICAL DECOMP/DISCECTOMY FUSION N/A 02/02/2022   Procedure: CERVICAL FOUR CORPECTOMY WITH PLATE REMOVAL;  Surgeon: Vallarie Mare, MD;  Location: [facility];  Service: Neurosurgery;  Laterality: N/A;   BACK SURGERY     Patient Active Problem List   Diagnosis Date Noted   Impaired gait and mobility 03/16/2022   Pressure injury of skin 02/12/2022   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[place]) 02/11/2022   C4 cervical fracture ([place]) 01/31/2022   Closed cervical spine fracture ([place]) 01/31/2022    ONSET DATE: 02/02/22  REFERRING DIAG: H63.149F (ICD-10-CM) - Central cord syndrome at unspecified level of cervical spinal cord, initial encounter   THERAPY DIAG:  Muscle weakness (generalized)  Other abnormalities of gait and mobility  Unsteadiness on feet  Difficulty in walking, not elsewhere classified  Rationale for Evaluation and Treatment Rehabilitation  SUBJECTIVE:                                                                                                                                                                                             SUBJECTIVE STATEMENT: Good check up with surgeon, able to use foam collar now.  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 No  PRECAUTIONS: Other: Miami collar , lifting restrictions (0#), no bending  WEIGHT BEARING RESTRICTIONS No  FALLS: Has patient fallen  in last 6 months? Yes. Number of falls 1  LIVING ENVIRONMENT: Lives with: lives alone Lives in: House/apartment Stairs: Yes: External: 2 steps; on left going up Has following equipment at home: Gilford Rile - 2 wheeled Has walk-in shower, shower chair, BSC, bed rail  PLOF: [place] return to independence  OBJECTIVE:    TODAY'S TREATMENT: 04/15/22 Activity Comments  Treadmill x 12 min 1.2-1.7 mph with ability to complete in one trial. Rates 11/20 Borg Exertion  Stair taps 2x10 6", 1x10 standing on airex  Sidestepping, retrowalk, tandem walk 4x25 ft                 DIAGNOSTIC FINDINGS: imaging pre and post-op  COGNITION: Overall cognitive status: Within functional limits for tasks assessed   SENSATION: WFL  COORDINATION: WNL  EDEMA:  Wearing compression socks  MUSCLE TONE: WNL   DTRs:  DNT  POSTURE: No Significant postural limitations  LOWER EXTREMITY ROM:     WNL  LOWER EXTREMITY MMT:    Seated resisted tests reveal 4/5 gross strength BLE, no unilateral deficits noted  BED MOBILITY:  DNT  TRANSFERS: Assistive device utilized: None  Sit to stand: Complete Independence Stand to sit: Complete Independence Chair to chair: Complete Independence Floor:  DNT due to safety concerns of cervical precautions  RAMP:  Level of Assistance: Modified independence Assistive  device utilized: Walker - 2 wheeled Ramp Comments:   CURB:  Level of Assistance: CGA Assistive device utilized: Environmental consultant - 2 wheeled Curb Comments: noted to catch toe edge of step  STAIRS:  Level of Assistance: SBA and CGA  Stair Negotiation Technique: Alternating Pattern  with Single Rail on Left  Number of Stairs: 8   Height of Stairs: 4-6"  Comments:   GAIT: Gait pattern: WFL Distance walked:  Assistive device utilized: Environmental consultant - 2 wheeled and None Level of assistance: Modified independence Comments: slow, but steady  FUNCTIONAL TESTs:  5 times sit to stand: 15.45 sec Timed up and go (TUG): 21.09 sec w/out RW, slow/steady turn 10 meter walk test: 2.6 ft/sec Berg Balance Scale: 50/56  PATIENT SURVEYS:    TODAY'S TREATMENT:  assessment   PATIENT EDUCATION: Education details: assessment findings Person educated: Patient and Child(ren) Education method: Explanation Education comprehension: verbalized understanding   HOME EXERCISE PROGRAM: To be initiated    GOALS: Goals reviewed with patient? Yes  SHORT TERM GOALS: Target date: 04/15/2022  Patient will be independent in HEP to improve functional outcomes Baseline: Goal status: INITIAL  2.  Demonstrate improved balance and safety with gait per time 15 sec TUG test Baseline: 21 sec w/out AD; (04/15/22) 17.24 sec Goal status: On-going    LONG TERM GOALS: Target date: 05/06/2022  The patient will improve gait speed to > or equal to 3.0 ft/sec and return to full community ambulator classification of gait Baseline: 2.6 ft/sec w/out AD Goal status: INITIAL  2.  Manifest low risk for falls per TUG test time 12 sec or better to improve safety with gait Baseline: 21.09 sec w/out AD Goal status: INITIAL  3.  Demonstrate/report ability to stand/ambulate x 30 min w/out need for rest period to improve activity tolerance for community outings/shopping Baseline: 5-10 min per report Goal status: INITIAL  4.   Demonstrate modified independent stair ambulation while carrying bag to improve safety with home entry/exit Baseline: SBA-CGA Goal status: INITIAL    ASSESSMENT:  CLINICAL IMPRESSION: Progressing with gait endurance and improving in singe limb support/weight shift as evidenced by alt stair  taps drill on compliant surface and tandem walk w/out LOB. Continue to next session and re-assess if additional visits are indicated   OBJECTIVE IMPAIRMENTS decreased activity tolerance, decreased balance, decreased endurance, decreased knowledge of use of DME, difficulty walking, and decreased strength.   ACTIVITY LIMITATIONS lifting, standing, squatting, stairs, and locomotion level  PARTICIPATION LIMITATIONS: meal prep, shopping, and community activity  PERSONAL FACTORS Age and Time since onset of injury/illness/exacerbation are also affecting patient's functional outcome.   REHAB POTENTIAL: Excellent  CLINICAL DECISION MAKING: Evolving/moderate complexity  EVALUATION COMPLEXITY: Moderate  PLAN: PT FREQUENCY: 1x/week  PT DURATION: 6 weeks  PLANNED INTERVENTIONS: Therapeutic exercises, Therapeutic activity, Neuromuscular re-education, Balance training, Gait training, Patient/Family education, Self Care, Joint mobilization, Stair training, Vestibular training, Canalith repositioning, DME instructions, Aquatic Therapy, Dry Needling, Electrical stimulation, and Manual therapy  PLAN FOR NEXT SESSION: stair taps, LE strengthening, treadmill for speed/incline   3:36 PM, 04/22/22 M. Sherlyn Lees, PT, DPT Physical Therapist- [place] Office Number: [phone]

## 2022-04-22 NOTE — Therapy (Unsigned)
OUTPATIENT OCCUPATIONAL THERAPY TREATMENT NOTE  Patient Name: Shelby Fleming MRN: [id number] DOB:[birth year], 76 y.o., female Today's Date: 04/22/2022  PCP: London Pepper, MD REFERRING PROVIDER: Cathlyn Parsons, PA-C   OT End of Session - 04/22/22 1621     Visit Number 5    Number of Visits 9    Date for OT Re-Evaluation 05/24/22    Authorization Type UHC Medicare    Authorization Time Period VL: [place]    OT Start Time 1616    OT Stop Time 1658    OT Time Calculation (min) 42 min    Activity Tolerance Patient tolerated treatment well    Behavior During Therapy WFL for tasks assessed/performed            Past Medical History:  Diagnosis Date   Hypertension    Psoriasis    Past Surgical History:  Procedure Laterality Date   ABDOMINAL HYSTERECTOMY     ANTERIOR CERVICAL DECOMP/DISCECTOMY FUSION N/A 02/02/2022   Procedure: CERVICAL FOUR CORPECTOMY WITH PLATE REMOVAL;  Surgeon: Vallarie Mare, MD;  Location: [facility];  Service: Neurosurgery;  Laterality: N/A;   BACK SURGERY     Patient Active Problem List   Diagnosis Date Noted   Impaired gait and mobility 03/16/2022   Pressure injury of skin 02/12/2022   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   C4 cervical fracture ([place]) 01/31/2022   Closed cervical spine fracture ([place]) 01/31/2022    ONSET DATE: 01/31/22  REFERRING DIAG: X44.818H (ICD-10-CM) - Central cord syndrome at unspecified level of cervical spinal cord, initial encounter   THERAPY DIAG:  Stiffness of right shoulder, not elsewhere classified  Muscle weakness (generalized)  Other abnormalities of gait and mobility  Other disturbances of skin sensation  Rationale for Evaluation and Treatment Rehabilitation  SUBJECTIVE:   SUBJECTIVE STATEMENT: Pt reports she had a follow up with her doctor and was able to transition into a soft collar Pt accompanied by: self  PERTINENT HISTORY: Central cord syndrome/traumatic incomplete C4 quadriplegia s/p C4 corpectomy,  including discectomy and bil foraminotomies on 02/02/22 after sustaining a fall on 01/31/22. PMH includes hx of C5-7 ACDF, HTN, HLD, psoriasis, and severe bil hand OA Per chart review: CT cervical spine showed fracture through the C4 vertebral body in the inferior anterior corner at C3. 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 MRI cervical spine again known C4 body and C3 anterior inferior corner fractures. 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 No visible injury to the posterior elements. C4-5 cord flattening primarily from central disc protrusion. Milder degenerative spinal stenosis C3-4. By foraminal impingement at C3-4 and C4-5. Left foraminal impingement of C7-T1 as well as C5-C7 ACDF.   PRECAUTIONS: Cervical collar for about 3 more weeks per surgeon; follow-up 04/17/22  WEIGHT BEARING RESTRICTIONS No  PAIN: Are you having pain? No  PATIENT GOALS: "curl my hair"  OBJECTIVE:    UPPER EXTREMITY ROM     Active ROM Left Eval - 7/19 Right Eval - 7/19 Right 04/15/22  Shoulder flexion 120 28 42  Shoulder abduction 89 36   Shoulder adduction     Shoulder extension     Shoulder internal rotation     Shoulder external rotation     Elbow flexion  135   Elbow extension  51 -8  Wrist flexion     Wrist extension 64 53   Wrist ulnar deviation     Wrist radial deviation     Wrist  pronation     Wrist supination     (Blank rows = not tested)   TODAY'S TREATMENT:   Saebo glide sitting upright and in supine PROM shoulder ER and flexion Chest press w/ 2 lb dowel 15x; single-arm w/ 2 lb dumbbell 10x each Single-arm shoulder flexion, focusing on anterior deltoid activation; 2x10   PATIENT EDUCATION: Ongoing condition-specific education related to therapeutic interventions completed this session; also discussed shoulder anatomy and benefit of scapular stabilization and strengthening in conjunction w/ focus on ROM Person  educated: Patient and Child(ren) Education method: Explanation Education comprehension: verbalized understanding   HOME EXERCISE PROGRAM: See patient instructions - shoulder ER; scapular retraction  MedBridge Code: [email or web address] URL: [email or web address] Date: 04/01/2022 Prepared by: [facility]  Exercises - Supine Shoulder Flexion with Dowel to 90  - 1 x daily - 3-4 x weekly - 2 sets - 10 reps - Supine Shoulder Press with Dowel  - 1 x daily - 3-4 x weekly - 2 sets - 10 reps - Supine Shoulder Horizontal Abduction Adduction AAROM with Dowel  - 1 x daily - 3-4 x weekly - 2 sets - 10 reps   GOALS: Goals reviewed with patient? Yes  SHORT TERM GOALS: Target date: 04/24/22    Status:  1 Pt will demonstrate independence w/ initial HEP designed for RUE ROM Baseline: To be administered Progressing  2 Pt to increase R shoulder flexion to at least 45 degrees for improved participation in functional tasks involving forward reach Baseline: 28* of shoulder flex Progressing - 04/22/22: 56 deg  3 Pt to increase R elbow extension to at least 15 degrees for improved participation in functional tasks involving forward reach Baseline: 51* of elbow ext Met 8* from neutral on 04/15/22    LONG TERM GOALS: Target date: 05/24/22    Status:  1 Pt will increase strength in R arm to at least 4/5 MMT for improved functional ability to carry out self-care and higher-level homecare tasks with less difficulty Baseline: 2+/5 Progressing  2 Pt will improve grip strength in R hand to at least 20 lbs for functional use at home and in IADLs  Baseline: Unable to assess at evaluation Progressing  3 Pt will demonstrate ability to complete hair care task w/ Mod Ind by d/c Baseline: Requires assist w/ hair care Progressing     ASSESSMENT:  CLINICAL IMPRESSION: Pt reports continued attempts to engage in functional household tasks and is really focusing on preventing  compensatory patterns at shoulder. OT encouraged pt to also focus on targeted HEP exercises, reiterating benefit of supine positioning due to improved support and stability decreasing compensatory patterns as well as scapular stability exercises and functional carryover of each.  Pt pleased with improved shoulder and elbow ROM, reports even noticing increased ability to engage in household tasks and dressing.  Pt continues to report difficulty with donning bra and compression socks.  Engaged in problem solving of both with use of adaptive techniques and AE, pt to attempt recommendations at home.  PERFORMANCE DEFICITS in functional skills including sensation, ROM, strength, pain, mobility, balance, body mechanics, decreased knowledge of precautions, and UE functional use.  IMPAIRMENTS are limiting patient from ADLs, IADLs, rest and sleep, and leisure.   COMORBIDITIES may have co-morbidities  that affects occupational performance. Patient will benefit from skilled OT to address above impairments and improve overall function.   PLAN: OT FREQUENCY: 1x/week  OT DURATION: 8 weeks  PLANNED INTERVENTIONS: self care/ADL training, therapeutic  exercise, therapeutic activity, neuromuscular re-education, manual therapy, passive range of motion, functional mobility training, aquatic therapy, splinting, electrical stimulation, moist heat, cryotherapy, patient/family education, and DME and/or AE instructions  RECOMMENDED OTHER SERVICES: Currently receiving PT services at this location  CONSULTED AND AGREED WITH PLAN OF CARE: Patient and family member/caregiver  PLAN FOR NEXT SESSION: continue w/ scapular stabilization and supine exercises - progress/update exercises for R shoulder; address functional mobility and adaptive techniques with dressing tasks.   Kathrine Cords, OTR/L 04/22/2022, 5:21 PM

## 2022-04-27 DIAGNOSIS — Z961 Presence of intraocular lens: Secondary | ICD-10-CM | POA: Diagnosis not present

## 2022-04-28 DIAGNOSIS — M1991 Primary osteoarthritis, unspecified site: Secondary | ICD-10-CM | POA: Diagnosis not present

## 2022-04-28 DIAGNOSIS — L4059 Other psoriatic arthropathy: Secondary | ICD-10-CM | POA: Diagnosis not present

## 2022-04-28 DIAGNOSIS — M5030 Other cervical disc degeneration, unspecified cervical region: Secondary | ICD-10-CM | POA: Diagnosis not present

## 2022-04-28 DIAGNOSIS — L409 Psoriasis, unspecified: Secondary | ICD-10-CM | POA: Diagnosis not present

## 2022-04-29 ENCOUNTER — Ambulatory Visit: Payer: Medicare Other | Admitting: Occupational Therapy

## 2022-04-29 ENCOUNTER — Ambulatory Visit: Payer: Medicare Other

## 2022-04-29 DIAGNOSIS — R262 Difficulty in walking, not elsewhere classified: Secondary | ICD-10-CM

## 2022-04-29 DIAGNOSIS — M25611 Stiffness of right shoulder, not elsewhere classified: Secondary | ICD-10-CM | POA: Diagnosis not present

## 2022-04-29 DIAGNOSIS — R2681 Unsteadiness on feet: Secondary | ICD-10-CM | POA: Diagnosis not present

## 2022-04-29 DIAGNOSIS — R2689 Other abnormalities of gait and mobility: Secondary | ICD-10-CM | POA: Diagnosis not present

## 2022-04-29 DIAGNOSIS — R208 Other disturbances of skin sensation: Secondary | ICD-10-CM

## 2022-04-29 DIAGNOSIS — M6281 Muscle weakness (generalized): Secondary | ICD-10-CM

## 2022-04-29 NOTE — Therapy (Signed)
OUTPATIENT PHYSICAL THERAPY Treatment and D/C Summary   Patient Name: Shelby Fleming MRN: [id number] DOB:Mar 16, 1946, 76 y.o., female Today's Date: 04/29/2022   PCP: London Pepper REFERRING PROVIDER: [place] [place] THERAPY DISCHARGE SUMMARY  Visits from Start of Care: 6  Current functional level related to goals / functional outcomes: Goals partially met   Remaining deficits: Decreased gait speed   Education / Equipment: HEP    Patient agrees to discharge. Patient goals were partially met. Patient is being discharged due to being pleased with the current functional level.   PT End of Session - 04/29/22 1622     Visit Number 6    Number of Visits 6    Date for PT Re-Evaluation 05/20/22    Authorization Type UHC Medicare    PT Start Time 4098    PT Stop Time 1700    PT Time Calculation (min) 45 min    Equipment Utilized During Treatment Gait belt    Activity Tolerance Patient tolerated treatment well    Behavior During Therapy WFL for tasks assessed/performed             Past Medical History:  Diagnosis Date   Hypertension    Psoriasis    Past Surgical History:  Procedure Laterality Date   ABDOMINAL HYSTERECTOMY     ANTERIOR CERVICAL DECOMP/DISCECTOMY FUSION N/A 02/02/2022   Procedure: CERVICAL FOUR CORPECTOMY WITH PLATE REMOVAL;  Surgeon: Vallarie Mare, MD;  Location: [facility];  Service: Neurosurgery;  Laterality: N/A;   BACK SURGERY     Patient Active Problem List   Diagnosis Date Noted   Impaired gait and mobility 03/16/2022   Pressure injury of skin 02/12/2022   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   C4 cervical fracture ([place]) 01/31/2022   Closed cervical spine fracture ([place]) 01/31/2022    ONSET DATE: 02/02/22  REFERRING DIAG: J19.147W (ICD-10-CM) - Central cord syndrome at unspecified level of cervical spinal cord, initial encounter   THERAPY DIAG:  Muscle weakness (generalized)  Other abnormalities of gait and mobility  Unsteadiness  on feet  Difficulty in walking, not elsewhere classified  Rationale for Evaluation and Treatment Rehabilitation  SUBJECTIVE:                                                                                                                                                                                             SUBJECTIVE STATEMENT: Feeling more confident. Usually not using AD in the home and only use RW when going outside  Pt accompanied by: family member  PERTINENT HISTORY: 76 yr old female recent hx 02/02/22 of Central cord syndrome/traumatic incomplete C4 quadriplegia -  ASIA D- s/p C4 corpectomy/discectomy/foraminotomies with Neurogenic bowel and bladder and superficial thrombosis of Distal LE; as well as HTN, HLD, psoriasis and B/L hand OA, and MASD under breasts   PAIN:  Are you having pain? No  PRECAUTIONS: Other: [place] collar , lifting restrictions (0#), no bending  WEIGHT BEARING RESTRICTIONS No  FALLS: Has patient fallen in last 6 months? Yes. Number of falls 1  LIVING ENVIRONMENT: Lives with: lives alone Lives in: House/apartment Stairs: Yes: External: 2 steps; on left going up Has following equipment at home: Walker - 2 wheeled Has walk-in shower, shower chair, BSC, bed rail  PLOF: [place] return to independence  OBJECTIVE:   TODAY'S TREATMENT: 04/29/22 Activity Comments  Gait speed 2.7 ft/sec  TUG test 13 sec  Gait training Independent level surfaces, modified indep with outside surfaces using RW. Stair training carrying 5# grocery bag LUE modified indep using right HR  Balance activities on firm and foam   Berg Balance Test 52/56             DIAGNOSTIC FINDINGS: imaging pre and post-op  COGNITION: Overall cognitive status: Within functional limits for tasks assessed   SENSATION: WFL  COORDINATION: WNL  EDEMA:  Wearing compression socks  MUSCLE TONE: WNL   DTRs:  DNT  POSTURE: No Significant postural  limitations  LOWER EXTREMITY ROM:     WNL  LOWER EXTREMITY MMT:    Seated resisted tests reveal 4/5 gross strength BLE, no unilateral deficits noted  BED MOBILITY:  DNT  TRANSFERS: Assistive device utilized: None  Sit to stand: Complete Independence Stand to sit: Complete Independence Chair to chair: Complete Independence Floor:  DNT due to safety concerns of cervical precautions  RAMP:  Level of Assistance: Modified independence Assistive device utilized: Walker - 2 wheeled Ramp Comments:   CURB:  Level of Assistance: CGA Assistive device utilized: Environmental consultant - 2 wheeled Curb Comments: noted to catch toe edge of step  STAIRS:  Level of Assistance: SBA and CGA  Stair Negotiation Technique: Alternating Pattern  with Single Rail on Left  Number of Stairs: 8   Height of Stairs: 4-6"  Comments:   GAIT: Gait pattern: WFL Distance walked:  Assistive device utilized: Environmental consultant - 2 wheeled and None Level of assistance: Modified independence Comments: slow, but steady  FUNCTIONAL TESTs:  5 times sit to stand: 15.45 sec Timed up and go (TUG): 21.09 sec w/out RW, slow/steady turn 10 meter walk test: 2.6 ft/sec Berg Balance Scale: 50/56  PATIENT SURVEYS:    TODAY'S TREATMENT:  assessment   PATIENT EDUCATION: Education details: assessment findings Person educated: Patient and Child(ren) Education method: Explanation Education comprehension: verbalized understanding   HOME EXERCISE PROGRAM: To be initiated    GOALS: Goals reviewed with patient? Yes  SHORT TERM GOALS: Target date: 04/15/2022  Patient will be independent in HEP to improve functional outcomes Baseline: Goal status: MET  2.  Demonstrate improved balance and safety with gait per time 15 sec TUG test Baseline: 21 sec w/out AD; (04/15/22) 17.24 sec; (04/29/22) 13 sec Goal status: MET    LONG TERM GOALS: Target date: 05/06/2022  The patient will improve gait speed to > or equal to 3.0 ft/sec and  return to full community ambulator classification of gait Baseline: 2.6 ft/sec w/out AD; 2.7 ft/sec w/out AD Goal status: NOT MET  2.  Manifest low risk for falls per TUG test time 12 sec or better to improve safety with gait Baseline: 21.09 sec w/out AD; 13  sec Goal status: NOT MET  3.  Demonstrate/report ability to stand/ambulate x 30 min w/out need for rest period to improve activity tolerance for community outings/shopping Baseline: 5-10 min per report; able to complete shopping trips Goal status: MET  4.  Demonstrate modified independent stair ambulation while carrying bag to improve safety with home entry/exit Baseline: SBA-CGA Goal status: MET    ASSESSMENT:  CLINICAL IMPRESSION: Pt has progressed quite well with POC details and able to ambulate at an independent level with household distances and surfaces and able to ambulate modified independent on uneven surfaces and stair negotiation.  Demonstrates improved TUG test performance without need for AD.  Ambulates with decrease in gait speed due to hesitancy but no unsteadiness present.  Pt will D/C to HEP and recommended walking program for progressive time/distance.    OBJECTIVE IMPAIRMENTS decreased activity tolerance, decreased balance, decreased endurance, decreased knowledge of use of DME, difficulty walking, and decreased strength.   ACTIVITY LIMITATIONS lifting, standing, squatting, stairs, and locomotion level  PARTICIPATION LIMITATIONS: meal prep, shopping, and community activity  PERSONAL FACTORS Age and Time since onset of injury/illness/exacerbation are also affecting patient's functional outcome.   REHAB POTENTIAL: Excellent  CLINICAL DECISION MAKING: Evolving/moderate complexity  EVALUATION COMPLEXITY: Moderate  PLAN: PT FREQUENCY: 1x/week  PT DURATION: 6 weeks  PLANNED INTERVENTIONS: Therapeutic exercises, Therapeutic activity, Neuromuscular re-education, Balance training, Gait training, Patient/Family  education, Self Care, Joint mobilization, Stair training, Vestibular training, Canalith repositioning, DME instructions, Aquatic Therapy, Dry Needling, Electrical stimulation, and Manual therapy  PLAN FOR NEXT SESSION: D/C to HEP   4:22 PM, 04/29/22 M. Sherlyn Lees, PT, DPT Physical Therapist- [place] Office Number: [phone]

## 2022-04-29 NOTE — Therapy (Signed)
OUTPATIENT OCCUPATIONAL THERAPY TREATMENT NOTE  Patient Name: Shelby Fleming MRN: [id number] DOB:[birth year], 76 y.o., female Today's Date: 04/29/2022  PCP: London Pepper, MD REFERRING PROVIDER: Cathlyn Parsons, PA-C   OT End of Session - 04/29/22 1615     Visit Number 6    Number of Visits 9    Date for OT Re-Evaluation 05/24/22    Authorization Type U[employer] Medicare    Authorization Time Period VL: [place]    OT Start Time 6789    OT Stop Time 1618    OT Time Calculation (min) 40 min    Activity Tolerance Patient tolerated treatment well    Behavior During Therapy [facility] for tasks assessed/performed             Past Medical History:  Diagnosis Date   Hypertension    Psoriasis    Past Surgical History:  Procedure Laterality Date   ABDOMINAL HYSTERECTOMY     ANTERIOR CERVICAL DECOMP/DISCECTOMY FUSION N/A 02/02/2022   Procedure: CERVICAL FOUR CORPECTOMY WITH PLATE REMOVAL;  Surgeon: Vallarie Mare, MD;  Location: [place];  Service: Neurosurgery;  Laterality: N/A;   BACK SURGERY     Patient Active Problem List   Diagnosis Date Noted   Impaired gait and mobility 03/16/2022   Pressure injury of skin 02/12/2022   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   C4 cervical fracture ([place]) 01/31/2022   Closed cervical spine fracture ([place]) 01/31/2022    ONSET DATE: 01/31/22  REFERRING DIAG: F81.017P (ICD-10-CM) - Central cord syndrome at unspecified level of cervical spinal cord, initial encounter   THERAPY DIAG:  Stiffness of right shoulder, not elsewhere classified  Muscle weakness (generalized)  Other abnormalities of gait and mobility  Other disturbances of skin sensation  Unsteadiness on feet  Rationale for Evaluation and Treatment Rehabilitation  SUBJECTIVE:   SUBJECTIVE STATEMENT: Pt reports that she can get clean cups on the shelf occasionally. 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 PMH includes hx of C5-7 ACDF, HTN, HLD, psoriasis, and severe bil hand OA  Per chart review: CT cervical spine showed fracture through the C4 vertebral body in the inferior anterior corner at C3. 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 MRI cervical spine again known C4 body and C3 anterior inferior corner fractures. 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 No visible injury to the posterior elements. C4-5 cord flattening primarily from central disc protrusion. Milder degenerative spinal stenosis C3-4. By foraminal impingement at C3-4 and C4-5. Left foraminal impingement of C7-T1 as well as C5-C7 ACDF.   PRECAUTIONS: Cervical soft collar; wear prn per pt report  WEIGHT BEARING RESTRICTIONS No  PAIN: Are you having pain? No  PATIENT GOALS: "curl my hair"  OBJECTIVE:    UPPER EXTREMITY ROM     Active ROM Left Eval - 7/19 Right Eval - 7/19 Right 04/15/22 Right 8/16  Shoulder flexion 120 28 42 54  Shoulder abduction 89 36    Shoulder adduction      Shoulder extension      Shoulder internal rotation      Shoulder external rotation      Elbow flexion  135    Elbow extension  51 8 6  Wrist flexion      Wrist extension 64 53    Wrist ulnar deviation  Wrist radial deviation      Wrist pronation      Wrist supination      (Blank rows = not tested)   TODAY'S TREATMENT:  Saebo Glide Closed- chain AAROM of R shoulder flexion and abduction with elbow extended using Saebo glide at 45 degree angle while sitting upright. Pt completed 2x10, demonstrating improved shoulder ROM and decreased shoulder hike.    Strengthening Hand Gripper: with RUE on 10# with green spring. Pt picked up 1 inch blocks with gripper with 0 drops and no difficulty and no reports of pain, discomfort from arthritis.  Increased challenge to 15# with green  spring.  Pt demonstrating increased difficulty with ability to squeeze dropping 3 blocks due to decreased sustained grasp.  Pt demonstrating minimal lean to L to compensate for limited shoulder ROM.   Active ROM Internal/external rotation in sitting with pt able to complete without pain or limited ROM.  Therapist instructed pt to complete with 1# dumbbell for added strengthening.  Discussed functional carryover of internal/external rotation even to hair care.    PATIENT EDUCATION: Ongoing condition-specific education related to therapeutic interventions completed this session; also discussed shoulder anatomy and benefit of scapular stabilization and strengthening in conjunction w/ focus on ROM Person educated: Patient and Child(ren) Education method: Explanation Education comprehension: verbalized understanding   HOME EXERCISE PROGRAM: See patient instructions - shoulder ER; scapular retraction  MedBridge Code: [email or web address] URL: [email or web address] Date: 04/01/2022 Prepared by: [facility]  Exercises - Supine Shoulder Flexion with Dowel to 90  - 1 x daily - 3-4 x weekly - 2 sets - 10 reps - Supine Shoulder Press with Dowel  - 1 x daily - 3-4 x weekly - 2 sets - 10 reps - Supine Shoulder Horizontal Abduction Adduction AAROM with Dowel  - 1 x daily - 3-4 x weekly - 2 sets - 10 reps   GOALS: Goals reviewed with patient? Yes  SHORT TERM GOALS: Target date: 04/24/22    Status:  1 Pt will demonstrate independence w/ initial HEP designed for RUE ROM Baseline: To be administered Progressing  2 Pt to increase R shoulder flexion to at least 45 degrees for improved participation in functional tasks involving forward reach Baseline: 28* of shoulder flex Met - 04/22/22: 56 deg  3 Pt to increase R elbow extension to at least 15 degrees for improved participation in functional tasks involving forward reach Baseline: 51* of elbow ext Met 8* from  neutral on 04/15/22    LONG TERM GOALS: Target date: 05/24/22    Status:  1 Pt will increase strength in R arm to at least 4/5 MMT for improved functional ability to carry out self-care and higher-level homecare tasks with less difficulty Baseline: 2+/5 Progressing  2 Pt will improve grip strength in R hand to at least 20 lbs for functional use at home and in IADLs  Baseline: Unable to assess at evaluation Progressing  3 Pt will demonstrate ability to complete hair care task w/ Mod Ind by d/c Baseline: Requires assist w/ hair care Progressing     ASSESSMENT:  CLINICAL IMPRESSION: Pt reports continued attempts to engage in functional household tasks and is really focusing on preventing compensatory patterns at shoulder. Treatment session with focus on targeting true shoulder flexion, grading tasks up/down prn for increased challenge and to facilitate success. Pt able to complete all tasks in sitting this session while facilitating scapular alignment w/ retraction for with min to no compensatory  patterns observed when attempting movement while seated. Engaged in shoulder internal/external rotation as needed for hair styling.  PERFORMANCE DEFICITS in functional skills including sensation, ROM, strength, pain, mobility, balance, body mechanics, decreased knowledge of precautions, and UE functional use.  IMPAIRMENTS are limiting patient from ADLs, IADLs, rest and sleep, and leisure.   COMORBIDITIES may have co-morbidities  that affects occupational performance. Patient will benefit from skilled OT to address above impairments and improve overall function.   PLAN: OT FREQUENCY: 1x/week  OT DURATION: 8 weeks  PLANNED INTERVENTIONS: self care/ADL training, therapeutic exercise, therapeutic activity, neuromuscular re-education, manual therapy, passive range of motion, functional mobility training, aquatic therapy, splinting, electrical stimulation, moist heat, cryotherapy, patient/family  education, and DME and/or AE instructions  RECOMMENDED OTHER SERVICES: Currently receiving PT services at this location  CONSULTED AND AGREED WITH PLAN OF CARE: Patient and family member/caregiver  PLAN FOR NEXT SESSION: activity analysis/practice w/ curling iron; continue w/ scapular stabilization and supine exercises - progress/update exercises for R shoulder; address functional mobility and adaptive techniques with dressing tasks and styling hair   Layni Kreamer, OTR/L 04/29/2022, 4:26 PM

## 2022-05-06 ENCOUNTER — Ambulatory Visit: Payer: Medicare Other | Admitting: Physical Therapy

## 2022-05-06 ENCOUNTER — Ambulatory Visit: Payer: Medicare Other | Admitting: Occupational Therapy

## 2022-05-06 ENCOUNTER — Encounter: Payer: Self-pay | Admitting: Occupational Therapy

## 2022-05-06 DIAGNOSIS — R208 Other disturbances of skin sensation: Secondary | ICD-10-CM

## 2022-05-06 DIAGNOSIS — R262 Difficulty in walking, not elsewhere classified: Secondary | ICD-10-CM | POA: Diagnosis not present

## 2022-05-06 DIAGNOSIS — M25611 Stiffness of right shoulder, not elsewhere classified: Secondary | ICD-10-CM | POA: Diagnosis not present

## 2022-05-06 DIAGNOSIS — E785 Hyperlipidemia, unspecified: Secondary | ICD-10-CM | POA: Diagnosis not present

## 2022-05-06 DIAGNOSIS — R2681 Unsteadiness on feet: Secondary | ICD-10-CM | POA: Diagnosis not present

## 2022-05-06 DIAGNOSIS — M6281 Muscle weakness (generalized): Secondary | ICD-10-CM | POA: Diagnosis not present

## 2022-05-06 DIAGNOSIS — R2689 Other abnormalities of gait and mobility: Secondary | ICD-10-CM

## 2022-05-06 DIAGNOSIS — D649 Anemia, unspecified: Secondary | ICD-10-CM | POA: Diagnosis not present

## 2022-05-06 DIAGNOSIS — Z0000 Encounter for general adult medical examination without abnormal findings: Secondary | ICD-10-CM | POA: Diagnosis not present

## 2022-05-06 DIAGNOSIS — E1169 Type 2 diabetes mellitus with other specified complication: Secondary | ICD-10-CM | POA: Diagnosis not present

## 2022-05-06 NOTE — Therapy (Unsigned)
OUTPATIENT OCCUPATIONAL THERAPY TREATMENT NOTE & DISCHARGE SUMMARY  Patient Name: Shelby Fleming MRN: [id number] DOB:16-Dec-1945, 76 y.o., female Today's Date: 05/06/2022  PCP: London Pepper, MD REFERRING PROVIDER: Cathlyn Parsons, PA-C   OT End of Session - 05/06/22 1622     Visit Number 7    Number of Visits 9    Date for OT Re-Evaluation 05/24/22    Authorization Type UHC Medicare    Authorization Time Period VL: [place]    OT Start Time 5176    OT Stop Time 1657    OT Time Calculation (min) 40 min    Activity Tolerance Patient tolerated treatment well    Behavior During Therapy WFL for tasks assessed/performed            OCCUPATIONAL THERAPY DISCHARGE SUMMARY  Visits from Start of Care: 6  Current functional level related to goals / functional outcomes: Pt reports she is able to complete all ADLs w/ at least Mod Ind level   Remaining deficits: Fall risk; mild decreased BUE strength   Education / Equipment: HEP; condition-specific education; compensatory strategies for ADLs  Patient agrees to discharge. Patient goals were met. Patient is being discharged due to being pleased with current functional level and meeting the stated rehab goals.   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   C4 cervical fracture ([place]) 01/31/2022   Closed cervical spine fracture ([place]) 01/31/2022    ONSET DATE: 01/31/22  REFERRING DIAG: H60.737T (ICD-10-CM) - Central cord syndrome at  unspecified level of cervical spinal cord, initial encounter   THERAPY DIAG:  Stiffness of right shoulder, not elsewhere classified  Muscle weakness (generalized)  Other abnormalities of gait and mobility  Other disturbances of skin sensation  Rationale for Evaluation and Treatment Rehabilitation  SUBJECTIVE:   SUBJECTIVE STATEMENT: Pt reports she was finally able to do her hair again by herself on Sunday Pt accompanied by: self  PERTINENT HISTORY: Central cord syndrome/traumatic incomplete C4 quadriplegia s/p C4 corpectomy, including discectomy and bil foraminotomies on 02/02/22 after sustaining a fall on 01/31/22. PMH includes hx of C5-7 ACDF, HTN, HLD, psoriasis, and severe bil hand OA  Per chart review: CT cervical spine showed fracture through the C4 vertebral body in the inferior anterior corner at C3. 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 MRI cervical spine again known C4 body and C3 anterior inferior corner fractures. 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 No visible injury to the posterior elements. C4-5 cord flattening primarily from central disc protrusion. Milder degenerative spinal stenosis C3-4. By foraminal impingement at C3-4 and C4-5. Left foraminal impingement of C7-T1 as well as C5-C7 ACDF.   PRECAUTIONS: Cervical soft collar; wear prn per pt report  WEIGHT BEARING RESTRICTIONS No  PAIN: Are you having pain? No  PATIENT GOALS: "curl my hair"  OBJECTIVE:   UPPER EXTREMITY ROM     Active ROM Left Eval - 7/19 Right  Eval - 7/19 Right 04/15/22 Right 8/16  Shoulder flexion 120 28 42 54  Shoulder abduction 89 36    Shoulder adduction      Shoulder extension      Shoulder internal rotation      Shoulder external rotation      Elbow flexion  135    Elbow extension  51 8 6  Wrist flexion      Wrist extension 64 53    Wrist ulnar deviation      Wrist radial deviation      Wrist pronation       Wrist supination      (Blank rows = not tested)   TODAY'S TREATMENT:  Return to Driving Education provided related to graduated return to driving, recommending starting in an empty parking lot, progressing to neighborhood streets, community areas,  and then times/areas w/ increased traffic  HEP Review Scapular retraction against anchored resistance (yellow theraband) x10 with good technique  R shoulder ER against anchored resistance (yellow theraband) x10; education provided on positioning/alignment for carryover to home  Closed-chain AAROM of shoulder flexion w/ unweighted dowel x15 to head height w/out compensatory patterns or incr pain  AROM of bil shoulder horizontal abd/add w/ ER; able to complete w/out pain and no compensatory shoulder hiking    PATIENT EDUCATION: Ongoing condition-specific education related to therapeutic interventions completed this session Person educated: Patient and Child(ren) Education method: Explanation Education comprehension: verbalized understanding   HOME EXERCISE PROGRAM: Access Code: [email or web address]  Exercises - Seated Shoulder Flexion AAROM with Dowel  - 15 reps - Horizontal Shoulder Abduction and Adduction with Compression Garment  - 15 reps - Scapular Retraction with Resistance  - 2-3 sets - 15 reps - Shoulder External Rotation with Anchored Resistance with Towel Under Elbow  - 2-3 sets - 15 reps - Seated Single Arm Shoulder Flexion  - 2-3 sets - 15 reps   GOALS: Goals reviewed with patient? Yes  SHORT TERM GOALS: Target date: 04/24/22    Status:  1 Pt will demonstrate independence w/ initial HEP designed for RUE ROM Baseline: To be administered Met - 05/06/22  2 Pt to increase R shoulder flexion to at least 45 degrees for improved participation in functional tasks involving forward reach Baseline: 28* of shoulder flex Met - 04/22/22: 56 deg  3 Pt to increase R elbow extension to at least 15 degrees for improved participation in functional  tasks involving forward reach Baseline: 51* of elbow ext Met - 04/15/22:  8* from neutral    LONG TERM GOALS: Target date: 05/24/22    Status:  1 Pt will increase strength in R arm to at least 4/5 MMT for improved functional ability to carry out self-care and higher-level homecare tasks with less difficulty Baseline: 2+/5 Met - 05/06/22  2 Pt will improve grip strength in R hand to at least 20 lbs for functional use at home and in IADLs  Baseline: Unable to assess at evaluation Met - 05/06/22: 37 lbs w/ RUE  3 Pt will demonstrate ability to complete hair care task w/ Mod Ind by d/c Baseline: Requires assist w/ hair care Met - 05/06/22:      ASSESSMENT:  CLINICAL IMPRESSION: Shelby Fleming is a 76 y/o who has been seen in OP OT for RUE weakness, primarily proximal weakness, 2/2 C4 corpectomy and cervical plate and screws U3-1-4 on 02/02/22 s/p fall on 01/31/22 w/ temporary paresis then severe dysesthesias of BUE. Pt has shown improvements in all areas addressed  in therapy w/ 3 of 3 STGs and 3 of 3 LTGs met. Pt arrived to session reporting that her R shoulder had increased mobility and she was able to finally curl her hair by herself after not being able to for the past 3 months. Due to improvements, OT reassessed progress toward goals w/ good results and reviewed/updated full HEP in anticipation for d/c this session, administering yellow theraband at conclusion of session. Pt is appropriate for transition from skilled OT to HEP at this time, reports she is satisfied with progress, and is currently agreeable to discharge plan.   PLAN: OT FREQUENCY: 1x/week  OT DURATION: 8 weeks  PLANNED INTERVENTIONS: self care/ADL training, therapeutic exercise, therapeutic activity, neuromuscular re-education, manual therapy, passive range of motion, functional mobility training, aquatic therapy, splinting, electrical stimulation, moist heat, cryotherapy, patient/family education, and DME and/or AE  instructions  RECOMMENDED OTHER SERVICES: None  CONSULTED AND AGREED WITH PLAN OF CARE: Patient and family member/caregiver  PLAN FOR NEXT SESSION: D/C   Kathrine Cords, OTR/L 05/06/2022, 5:23 PM

## 2022-05-08 DIAGNOSIS — Z0000 Encounter for general adult medical examination without abnormal findings: Secondary | ICD-10-CM | POA: Diagnosis not present

## 2022-05-08 DIAGNOSIS — M791 Myalgia, unspecified site: Secondary | ICD-10-CM | POA: Diagnosis not present

## 2022-05-08 DIAGNOSIS — E785 Hyperlipidemia, unspecified: Secondary | ICD-10-CM | POA: Diagnosis not present

## 2022-05-08 DIAGNOSIS — G9520 Unspecified cord compression: Secondary | ICD-10-CM | POA: Diagnosis not present

## 2022-05-08 DIAGNOSIS — Z23 Encounter for immunization: Secondary | ICD-10-CM | POA: Diagnosis not present

## 2022-05-08 DIAGNOSIS — L4050 Arthropathic psoriasis, unspecified: Secondary | ICD-10-CM | POA: Diagnosis not present

## 2022-05-08 DIAGNOSIS — E1169 Type 2 diabetes mellitus with other specified complication: Secondary | ICD-10-CM | POA: Diagnosis not present

## 2022-05-08 DIAGNOSIS — I10 Essential (primary) hypertension: Secondary | ICD-10-CM | POA: Diagnosis not present

## 2022-05-13 ENCOUNTER — Encounter: Payer: Medicare Other | Admitting: Occupational Therapy

## 2022-05-20 ENCOUNTER — Encounter: Payer: Medicare Other | Admitting: Occupational Therapy

## 2022-06-02 ENCOUNTER — Other Ambulatory Visit: Payer: Self-pay | Admitting: Family Medicine

## 2022-06-02 DIAGNOSIS — E2839 Other primary ovarian failure: Secondary | ICD-10-CM

## 2022-06-25 ENCOUNTER — Other Ambulatory Visit: Payer: Self-pay | Admitting: Physical Medicine and Rehabilitation

## 2022-06-29 DIAGNOSIS — Z1231 Encounter for screening mammogram for malignant neoplasm of breast: Secondary | ICD-10-CM | POA: Diagnosis not present

## 2022-07-10 ENCOUNTER — Encounter
Payer: Medicare Other | Attending: Physical Medicine and Rehabilitation | Admitting: Physical Medicine and Rehabilitation

## 2022-07-10 ENCOUNTER — Encounter: Payer: Self-pay | Admitting: Physical Medicine and Rehabilitation

## 2022-07-10 VITALS — BP 135/72 | HR 81 | Ht 64.0 in | Wt 174.2 lb

## 2022-07-10 DIAGNOSIS — S12391D Other nondisplaced fracture of fourth cervical vertebra, subsequent encounter for fracture with routine healing: Secondary | ICD-10-CM | POA: Diagnosis not present

## 2022-07-10 NOTE — Progress Notes (Signed)
Subjective:    Patient ID: Shelby Fleming, female    DOB: [birth year], 76 y.o.   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 Also having pain in R arm but thinks due to OA/psoriatic arthritis.    Done with PT and OT_ got out early since doing so well.  Not using RW anymore .   Still using miralax some- and has to pee a lot- trying to drink a lot of water as well- sometimes peeing every 1-2 hours- Now has to get up 2x/night to pee.  Doesn't sleep soundly, which makes it harder.   Off Eliquis- since 04/09/22.   Back to cooking some- made whole made slaw- sliced tomatoes and jiffy mix cornbread-  Pt did it all.  Can now use curling iron! Careful having to put water in pot.  Because needs to support RUE- Saturday was helping rake leaves- sometimes gets tired and needs to build up endurance   Got her appetite back 06/27/22! Eating now- every month, something coming back.   Numbness/tingling B/L bottom of buttocks- still -variable.   Swelling has resolved as well.   Pain Inventory Average Pain 0 Pain Right Now 0 My pain is  no residual pain  In the last 24 hours, has pain interfered with the following? General activity 0 Relation with others 0 Enjoyment of life 0 What TIME of day is your pain at its worst? No pain Sleep (in general) Fair  Pain is worse with: sitting Pain improves with: medication  tylenol arthritis and australian dream cream Relief from Meds: 5  No family history on file. Social History   Socioeconomic History   Marital status: Single    Spouse name: Not on file   Number of children: Not on file    Years of education: Not on file   Highest education level: Not on file  Occupational History   Not on file  Tobacco Use   Smoking status: Never   Smokeless tobacco: Never  Vaping Use   Vaping Use: Never used  Substance and Sexual Activity   Alcohol use: No   Drug use: No   Sexual activity: Never    Birth control/protection: None  Other Topics Concern   Not on file  Social History Narrative   Not on file   Social Determinants of Health   Financial Resource Strain: Not on file  Food Insecurity: Not on file  Transportation Needs: Not on file  Physical Activity: Not on file  Stress: Not on file  Social Connections: Not on file   Past Surgical History:  Procedure Laterality Date   ABDOMINAL HYSTERECTOMY     ANTERIOR CERVICAL DECOMP/DISCECTOMY FUSION N/A 02/02/2022   Procedure: CERVICAL FOUR CORPECTOMY WITH PLATE REMOVAL;  Surgeon: Vallarie Mare, MD;  Location: [place];  Service: Neurosurgery;  Laterality: N/A;   BACK SURGERY     Past Surgical History:  Procedure Laterality Date   ABDOMINAL HYSTERECTOMY     ANTERIOR CERVICAL DECOMP/DISCECTOMY FUSION N/A 02/02/2022   Procedure: CERVICAL FOUR CORPECTOMY WITH PLATE REMOVAL;  Surgeon: Vallarie Mare, MD;  Location: [place];  Service:  Neurosurgery;  Laterality: N/A;   BACK SURGERY     Past Medical History:  Diagnosis Date   Hypertension    Psoriasis    BP 135/72   Pulse 81   Ht '5\' 4"'$  (1.626 m)   Wt 174 lb 3.2 oz (79 kg)   SpO2 97%   BMI 29.90 kg/m   Opioid Risk Score:   Fall Risk Score:  `1  Depression screen [facility] 2/9     07/10/2022    1:47 PM 03/16/2022   11:08 AM  Depression screen PHQ 2/9  Decreased Interest 0 0  Down, Depressed, Hopeless 0 0  PHQ - 2 Score 0 0  Altered sleeping  0  Tired, decreased energy  1  Change in appetite  1  Feeling bad or failure about yourself   0  Trouble concentrating  0  Moving slowly or fidgety/restless  0  Suicidal thoughts  0  PHQ-9 Score  2     Review of Systems   Constitutional: Negative.   HENT: Negative.    Eyes: Negative.   Respiratory: Negative.    Cardiovascular: Negative.   Gastrointestinal: Negative.   Endocrine: Negative.   Genitourinary: Negative.   Musculoskeletal:  Positive for arthralgias.  Skin: Negative.   Allergic/Immunologic: Negative.   Neurological: Negative.   Hematological: Negative.   Psychiatric/Behavioral: Negative.    All other systems reviewed and are negative.      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   2.  Has some tramadol at home- but not taking- kept JUST in CASE.   3. Has improved spasticity- has basically resolved.   4. Off Gabapentin and Flexeril  5. Off Eliquis/apixiban for dvt prophylaxis  6. Doing so well, doesn't think needs to see her anymore  7. F/U as needed  8. Suggest getting COVID vaccine-  9. Since I plan for worst case scenarios, call me AND surgeon if anything gets worse-   10. Can start driving- start slow  I spent a total of  23  minutes on total care today- >50%  coordination of care- due to discussion of has much she has resolved. Also to get COVID vaccine.

## 2022-07-10 NOTE — Patient Instructions (Addendum)
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   2.  Has some tramadol at home- but not taking- kept JUST in CASE.   3. Has improved spasticity- has basically resolved.   4. Off Gabapentin and Flexeril  5. Off Eliquis/apixiban for dvt prophylaxis  6. Doing so well, doesn't think needs to see her anymore  7. F/U as needed  8. Suggest getting COVID vaccine-  9. Since I plan for worst case scenarios, call me AND surgeon if anything gets worse-   10. Can start driving- please start slow!

## 2022-07-24 DIAGNOSIS — G959 Disease of spinal cord, unspecified: Secondary | ICD-10-CM | POA: Diagnosis not present

## 2022-07-24 DIAGNOSIS — S14104D Unspecified injury at C4 level of cervical spinal cord, subsequent encounter: Secondary | ICD-10-CM | POA: Diagnosis not present

## 2022-10-26 DIAGNOSIS — S14104D Unspecified injury at C4 level of cervical spinal cord, subsequent encounter: Secondary | ICD-10-CM | POA: Diagnosis not present

## 2022-11-12 DIAGNOSIS — E1165 Type 2 diabetes mellitus with hyperglycemia: Secondary | ICD-10-CM | POA: Diagnosis not present

## 2022-11-12 DIAGNOSIS — L4050 Arthropathic psoriasis, unspecified: Secondary | ICD-10-CM | POA: Diagnosis not present

## 2022-11-12 DIAGNOSIS — M791 Myalgia, unspecified site: Secondary | ICD-10-CM | POA: Diagnosis not present

## 2022-11-12 DIAGNOSIS — E785 Hyperlipidemia, unspecified: Secondary | ICD-10-CM | POA: Diagnosis not present

## 2022-11-12 DIAGNOSIS — E559 Vitamin D deficiency, unspecified: Secondary | ICD-10-CM | POA: Diagnosis not present

## 2022-11-12 DIAGNOSIS — Z9181 History of falling: Secondary | ICD-10-CM | POA: Diagnosis not present

## 2022-12-09 DIAGNOSIS — L4059 Other psoriatic arthropathy: Secondary | ICD-10-CM | POA: Diagnosis not present

## 2022-12-09 DIAGNOSIS — M1991 Primary osteoarthritis, unspecified site: Secondary | ICD-10-CM | POA: Diagnosis not present

## 2022-12-09 DIAGNOSIS — L409 Psoriasis, unspecified: Secondary | ICD-10-CM | POA: Diagnosis not present

## 2022-12-09 DIAGNOSIS — M5030 Other cervical disc degeneration, unspecified cervical region: Secondary | ICD-10-CM | POA: Diagnosis not present

## 2023-01-07 ENCOUNTER — Encounter: Payer: Self-pay | Admitting: Physical Medicine and Rehabilitation

## 2023-01-11 ENCOUNTER — Telehealth: Payer: Medicare Other | Admitting: Physician Assistant

## 2023-01-11 DIAGNOSIS — K5900 Constipation, unspecified: Secondary | ICD-10-CM | POA: Diagnosis not present

## 2023-01-11 MED ORDER — ONDANSETRON 4 MG PO TBDP: 4.0000 mg | ORAL_TABLET | Freq: Three times a day (TID) | ORAL | 0 refills | Status: AC | PRN

## 2023-01-11 NOTE — Patient Instructions (Signed)
  Ilda Foil, thank you for joining Piedad Climes, PA-C for today's virtual visit.  While this provider is not your primary care provider (PCP), if your PCP is located in our provider database this encounter information will be shared with them immediately following your visit.   A [facility] MyChart account gives you access to today's visit and all your visits, tests, and labs performed at [facility] " click here if you don't have a [facility] MyChart account or go to [email or web address]  Consent: (Patient) Shimere Levere provided verbal consent for this virtual visit at the beginning of the encounter.  Current Medications:  Current Outpatient Medications:    acetaminophen (TYLENOL) 325 MG tablet, Take 2 tablets (650 mg total) by mouth every 4 (four) hours as needed for mild pain ((score 1 to 3) or temp > 100.5)., Disp: , Rfl:    Apremilast (OTEZLA) 30 MG TABS, Take 30 mg by mouth 2 (two) times daily., Disp: , Rfl:    D 1000 25 MCG (1000 UT) capsule, Take 1 capsule (1,000 Units total) by mouth daily., Disp: 30 capsule, Rfl: 0   docusate sodium (COLACE) 100 MG capsule, Take 1 capsule (100 mg total) by mouth 2 (two) times daily., Disp: 10 capsule, Rfl: 0   loratadine (CLARITIN) 10 MG tablet, Take 1 tablet (10 mg total) by mouth daily., Disp: 30 tablet, Rfl: 0   omega-3 acid ethyl esters (LOVAZA) 1 g capsule, Take 2 capsules (2 g total) by mouth 2 (two) times daily., Disp: 60 capsule, Rfl: 0   polyethylene glycol (MIRALAX / GLYCOLAX) 17 g packet, Take 17 g by mouth daily., Disp: 14 each, Rfl: 0   Medications ordered in this encounter:  No orders of the defined types were placed in this encounter.    *If you need refills on other medications prior to your next appointment, please contact your pharmacy*  Follow-Up: Call back or seek an in-person evaluation if the symptoms worsen or if the condition fails to improve as anticipated.  [facility] [phone]  Other Instructions I encourage you to increase hydration and the amount of fiber in your diet.  Start a daily probiotic (Align, Culturelle, Digestive Advantage, etc.). Take 2 Tbs of Milk of Magnesia in a 4 oz glass of warmed prune juice today, along with a dulcolax stool softener. Can continue this every 2-3 days to if needed to help promote bowel movement. If no results by morning or anything worsening this evening or overnight, you will need an ER evaluation.  The zofran I have sent in is to use for nausea.    If you have been instructed to have an in-person evaluation today at a local Urgent Care facility, please use the link below. It will take you to a list of all of our available [facility], including address, phone number and hours of operation. Please do not delay care.  [facility]  If you or a family member do not have a primary care provider, use the link below to schedule a visit and establish care. When you choose a [facility] primary care physician or advanced practice provider, you gain a long-term partner in health. Find a Primary Care Provider  Learn more about 's in-office and virtual care options:  - Get Care Now

## 2023-01-11 NOTE — Progress Notes (Signed)
Virtual Visit Consent   Shelby Fleming, you are scheduled for a virtual visit with a [facility] provider today. Just as with appointments in the office, your consent must be obtained to participate. Your consent will be active for this visit and any virtual visit you may have with one of our providers in the next 365 days. If you have a MyChart account, a copy of this consent can be sent to you electronically.  As this is a virtual visit, video technology does not allow for your provider to perform a traditional examination. This may limit your provider's ability to fully assess your condition. If your provider identifies any concerns that need to be evaluated in person or the need to arrange testing (such as labs, EKG, etc.), we will make arrangements to do so. Although advances in technology are sophisticated, we cannot ensure that it will always work on either your end or our end. If the connection with a video visit is poor, the visit may have to be switched to a telephone visit. With either a video or telephone visit, we are not always able to ensure that we have a secure connection.  By engaging in this virtual visit, you consent to the provision of healthcare and authorize for your insurance to be billed (if applicable) for the services provided during this visit. Depending on your insurance coverage, you may receive a charge related to this service.  I need to obtain your verbal consent now. Are you willing to proceed with your visit today? Jonny Sylve has provided verbal consent on 01/11/2023 for a virtual visit (video or telephone). Shelby Fleming, [place]  Date: 01/11/2023 1:27 PM  Virtual Visit via Video Note   I, Shelby Fleming, connected with  Shelby Fleming  ([id number], 1946-08-31) on 01/11/23 at  1:15 PM EDT by a video-enabled telemedicine application and verified that I am speaking with the correct person using two identifiers.  Location: Patient: Virtual Visit Location Patient:  Home Provider: Virtual Visit Location Provider: Home Office   I discussed the limitations of evaluation and management by telemedicine and the availability of in person appointments. The patient expressed understanding and agreed to proceed.    History of Present Illness: Shelby Fleming is a 77 y.o. who identifies as a female who was assigned female at birth, and is being seen today with daughter for constipation starting last Thursday.  Notes she had an episode of nausea last week followed by nonbloody emesis.  Since then has had some intermittent nausea but has noted constipation.  Denies any recurring episodes of emesis.  Denies fever, chills, malaise or fatigue.  Notes her last full bowel movement was this past Thursday.  Has noted some increased gaseousness with increased flatus and belching.  Is able to hydrate and tolerate oral foods.  Has tried some Colace and MiraLAX over-the-counter to help promote bowel movement without any success.  Denies any history of bowel obstruction.  Denies recent change to diet or activity level.  Did recently move and there has been some stress related to that.  The only other change she is aware of is a change from city water to well water.  Is starting to note some generalized abdominal discomfort.  Denies history of colitis or diverticulitis.  Has prior history of C4 spinal injury, followed by physical medicine.  Is not currently on any narcotic pain medications.  Has had episodic constipation in the past for which she has required MiraLAX and stool softeners, but nothing  like this.  Daughter notes they reached out to primary care office but are awaiting a callback.  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   C4 cervical fracture (HCC) 01/31/2022   Closed cervical spine fracture (HCC) 01/31/2022    Allergies:   Allergies  Allergen Reactions   Atorvastatin Other (See Comments)    Muscle weakness   Codeine Nausea Only   Ezetimibe Other (See Comments)    Muscle weakness   Pitavastatin Other (See Comments)    Muscle weakness   Pravastatin Other (See Comments)    Weakness    Rosuvastatin Other (See Comments)    Muscle weakness   Medications:  Current Outpatient Medications:    ondansetron (ZOFRAN-ODT) 4 MG disintegrating tablet, Take 1 tablet (4 mg total) by mouth every 8 (eight) hours as needed for nausea or vomiting., Disp: 20 tablet, Rfl: 0   acetaminophen (TYLENOL) 325 MG tablet, Take 2 tablets (650 mg total) by mouth every 4 (four) hours as needed for mild pain ((score 1 to 3) or temp > 100.5)., Disp: , Rfl:    Apremilast (OTEZLA) 30 MG TABS, Take 30 mg by mouth 2 (two) times daily., Disp: , Rfl:    D 1000 25 MCG (1000 UT) capsule, Take 1 capsule (1,000 Units total) by mouth daily., Disp: 30 capsule, Rfl: 0   docusate sodium (COLACE) 100 MG capsule, Take 1 capsule (100 mg total) by mouth 2 (two) times daily., Disp: 10 capsule, Rfl: 0   loratadine (CLARITIN) 10 MG tablet, Take 1 tablet (10 mg total) by mouth daily., Disp: 30 tablet, Rfl: 0   omega-3 acid ethyl esters (LOVAZA) 1 g capsule, Take 2 capsules (2 g total) by mouth 2 (two) times daily., Disp: 60 capsule, Rfl: 0   polyethylene glycol (MIRALAX / GLYCOLAX) 17 g packet, Take 17 g by mouth daily., Disp: 14 each, Rfl: 0  Observations/Objective: Patient is well-developed, well-nourished in no acute distress.  Resting comfortably at home.  Head is normocephalic, atraumatic.  No labored breathing. Speech is clear and coherent with logical content.  Patient is alert and oriented at baseline.   Assessment and Plan: 1. Constipation, unspecified constipation type - ondansetron (ZOFRAN-ODT) 4 MG disintegrating tablet; Take 1 tablet (4 mg total) by mouth every 8 (eight) hours as needed for nausea or vomiting.  Dispense: 20 tablet; Refill:  0  Afebrile.  Only 1 episode of emesis at onset of symptoms.  Some residual nausea and generalized abdominal discomfort.  Able to hydrate and is still eating.  Given her medical history and age I have a low threshold for her having an in person evaluation as she is at a higher risk for fecal impaction.  Is able to pass gas, so low risk for current complete bowel obstruction, but could potentially be dealing with a fecalith or other starting partial obstruction.  Informed patient and mother that if not having successful bowel movement with the bowel regimen given at time of visit by tonight, they will need an UC/ER evaluation.  Did send in a prescription for Zofran to help with nausea. Will start on probiotic and 2 Tbs of Milk of magnesia in a 4oz glass of warm prune juice, continuing colace.   Follow Up Instructions: I discussed the assessment and treatment plan with the patient. The patient was provided an opportunity to  ask questions and all were answered. The patient agreed with the plan and demonstrated an understanding of the instructions.  A copy of instructions were sent to the patient via MyChart unless otherwise noted below.   The patient was advised to call back or seek an in-person evaluation if the symptoms worsen or if the condition fails to improve as anticipated.  Time:  I spent 10 minutes with the patient via telehealth technology discussing the above problems/concerns.    Shelby Climes, PA-C

## 2023-01-13 DIAGNOSIS — R110 Nausea: Secondary | ICD-10-CM | POA: Diagnosis not present

## 2023-01-13 DIAGNOSIS — D649 Anemia, unspecified: Secondary | ICD-10-CM | POA: Diagnosis not present

## 2023-01-13 DIAGNOSIS — R109 Unspecified abdominal pain: Secondary | ICD-10-CM | POA: Diagnosis not present

## 2023-01-14 DIAGNOSIS — K869 Disease of pancreas, unspecified: Secondary | ICD-10-CM | POA: Diagnosis not present

## 2023-01-14 DIAGNOSIS — N132 Hydronephrosis with renal and ureteral calculous obstruction: Secondary | ICD-10-CM | POA: Diagnosis not present

## 2023-01-14 DIAGNOSIS — N3289 Other specified disorders of bladder: Secondary | ICD-10-CM | POA: Diagnosis not present

## 2023-01-14 DIAGNOSIS — R109 Unspecified abdominal pain: Secondary | ICD-10-CM | POA: Diagnosis not present

## 2023-01-19 ENCOUNTER — Other Ambulatory Visit: Payer: Self-pay | Admitting: Urology

## 2023-01-19 DIAGNOSIS — N202 Calculus of kidney with calculus of ureter: Secondary | ICD-10-CM | POA: Diagnosis not present

## 2023-01-25 NOTE — Progress Notes (Addendum)
PCP - Arron Kenna Gilbert [facility] Cardiologist - no  PPM/ICD -  Device Orders -  Rep Notified -   Chest x-ray -  EKG - 02-04-22 epic Stress Test -  ECHO -  Cardiac Cath -   Sleep Study -  CPAP -   Fasting Blood Sugar -  Checks Blood Sugar _____ times a day  Blood Thinner Instructions: Aspirin Instructions:  ERAS Protcol - PRE-SURGERY    COVID vaccine -x4  Activity--Able to complete ADL's without SOB or Cp Anesthesia review: HTN, DM no meds. C4 break and spinal cord injury fully recovered  Patient denies shortness of breath, fever, cough and chest pain at PAT appointment   All instructions explained to the patient, with a verbal understanding of the material. Patient agrees to go over the instructions while at home for a better understanding. Patient also instructed to self quarantine after being tested for COVID-19. The opportunity to ask questions was provided.

## 2023-01-25 NOTE — Patient Instructions (Signed)
SURGICAL WAITING ROOM VISITATION  Patients having surgery or a procedure may have no more than 2 support people in the waiting area - these visitors may rotate.    Children under the age of 73 must have an adult with them who is not the patient.  If the patient needs to stay at the hospital during part of their recovery, the visitor guidelines for inpatient rooms apply. Pre-op nurse will coordinate an appropriate time for 1 support person to accompany patient in pre-op.  This support person may not rotate.    Please refer to the [facility] website for the visitor guidelines for Inpatients (after your surgery is over and you are in a regular room).       Your procedure is scheduled on: 01-27-23    Report to [facility] Main Entrance    Report to admitting at     11:15 AM   Call this number if you have problems the morning of surgery [phone]   Do not eat food :After Midnight.   After Midnight you may have the following liquids until _0730_____ AM DAY OF SURGERY  then nothing by mouth  Water Non-Citrus Juices (without pulp, NO RED-Apple, White grape, White cranberry) Black Coffee (NO MILK/CREAM OR CREAMERS, sugar ok)  Clear Tea (NO MILK/CREAM OR CREAMERS, sugar ok) regular and decaf                             Plain Jell-O (NO RED)                                           Fruit ices (not with fruit pulp, NO RED)                                     Popsicles (NO RED)                                                               Sports drinks like Gatorade (NO RED)                           If you have questions, please contact your surgeon's office.   FOLLOW AND ANY ADDITIONAL PRE OP INSTRUCTIONS YOU RECEIVED FROM YOUR SURGEON'S OFFICE!!!     Oral Hygiene is also important to reduce your risk of infection.                                    Remember - BRUSH YOUR TEETH THE MORNING OF SURGERY WITH YOUR REGULAR TOOTHPASTE  DENTURES WILL BE REMOVED PRIOR TO  SURGERY PLEASE DO NOT APPLY "Poly grip" OR ADHESIVES!!!   Do NOT smoke after Midnight   Take these medicines the morning of surgery with A SIP OF WATER: Tamsulosin, Claritin, cephalexin, Apremilast( Otezla), xanax if needed  DO NOT TAKE ANY ORAL DIABETIC MEDICATIONS DAY OF YOUR SURGERY  Bring CPAP mask and tubing day of surgery.  You may not have any metal on your body including hair pins, jewelry, and body piercing             Do not wear make-up, lotions, powders, perfumes/cologne, or deodorant  Do not wear nail polish including gel and S&S, artificial/acrylic nails, or any other type of covering on natural nails including finger and toenails. If you have artificial nails, gel coating, etc. that needs to be removed by a nail salon please have this removed prior to surgery or surgery may need to be canceled/ delayed if the surgeon/ anesthesia feels like they are unable to be safely monitored.   Do not shave  48 hours prior to surgery.             Do not bring valuables to the hospital. [facility] IS NOT             RESPONSIBLE   FOR VALUABLES.   Contacts, glasses, dentures or bridgework may not be worn into surgery.    DO NOT BRING YOUR HOME MEDICATIONS TO THE HOSPITAL. PHARMACY WILL DISPENSE MEDICATIONS LISTED ON YOUR MEDICATION LIST TO YOU DURING YOUR ADMISSION IN THE HOSPITAL!    Patients discharged on the day of surgery will not be allowed to drive home.  Someone NEEDS to stay with you for the first 24 hours after anesthesia.   Special Instructions: Bring a copy of your healthcare power of attorney and living will documents the day of surgery if you haven't scanned them before.              Please read over the following fact sheets you were given: IF YOU HAVE QUESTIONS ABOUT YOUR PRE-OP INSTRUCTIONS PLEASE CALL [phone]     [facility] - Preparing for Surgery Before surgery, you can play an important role.  Because skin is not sterile, your  skin needs to be as free of germs as possible.  You can reduce the number of germs on your skin by washing with CHG (chlorahexidine gluconate) soap before surgery.  CHG is an antiseptic cleaner which kills germs and bonds with the skin to continue killing germs even after washing. Please DO NOT use if you have an allergy to CHG or antibacterial soaps.  If your skin becomes reddened/irritated stop using the CHG and inform your nurse when you arrive at Short Stay. Do not shave (including legs and underarms) for at least 48 hours prior to the first CHG shower.  You may shave your face/neck. Please follow these instructions carefully:  1.  Shower with CHG Soap the night before surgery and the  morning of Surgery.  2.  If you choose to wash your hair, wash your hair first as usual with your  normal  shampoo.  3.  After you shampoo, rinse your hair and body thoroughly to remove the  shampoo.                           4.  Use CHG as you would any other liquid soap.  You can apply chg directly  to the skin and wash                       Gently with a scrungie or clean washcloth.  5.  Apply the CHG Soap to your body ONLY FROM THE NECK DOWN.   Do not use on face/ open  Wound or open sores. Avoid contact with eyes, ears mouth and genitals (private parts).                       Wash face,  Genitals (private parts) with your normal soap.             6.  Wash thoroughly, paying special attention to the area where your surgery  will be performed.  7.  Thoroughly rinse your body with warm water from the neck down.  8.  DO NOT shower/wash with your normal soap after using and rinsing off  the CHG Soap.                9.  Pat yourself dry with a clean towel.            10.  Wear clean pajamas.            11.  Place clean sheets on your bed the night of your first shower and do not  sleep with pets. Day of Surgery : Do not apply any lotions/deodorants the morning of surgery.  Please wear  clean clothes to the hospital/surgery center.  FAILURE TO FOLLOW THESE INSTRUCTIONS MAY RESULT IN THE CANCELLATION OF YOUR SURGERY PATIENT SIGNATURE_________________________________  NURSE SIGNATURE__________________________________  ________________________________________________________________________

## 2023-01-26 ENCOUNTER — Encounter (HOSPITAL_COMMUNITY)
Admission: RE | Admit: 2023-01-26 | Discharge: 2023-01-26 | Disposition: A | Discharge status: Home / Self Care | Payer: Medicare Other | Source: Ambulatory Visit | Attending: Urology | Admitting: Urology

## 2023-01-26 ENCOUNTER — Encounter (HOSPITAL_COMMUNITY): Payer: Self-pay

## 2023-01-26 ENCOUNTER — Other Ambulatory Visit: Payer: Self-pay

## 2023-01-26 VITALS — BP 126/55 | HR 91 | Temp 98.2°F | Resp 16 | Ht 63.0 in | Wt 172.0 lb

## 2023-01-26 DIAGNOSIS — Z794 Long term (current) use of insulin: Secondary | ICD-10-CM | POA: Insufficient documentation

## 2023-01-26 DIAGNOSIS — I10 Essential (primary) hypertension: Secondary | ICD-10-CM

## 2023-01-26 DIAGNOSIS — E119 Type 2 diabetes mellitus without complications: Secondary | ICD-10-CM | POA: Diagnosis not present

## 2023-01-26 DIAGNOSIS — Z01812 Encounter for preprocedural laboratory examination: Secondary | ICD-10-CM | POA: Insufficient documentation

## 2023-01-26 HISTORY — DX: Personal history of urinary calculi: Z87.442

## 2023-01-26 HISTORY — DX: Malignant (primary) neoplasm, unspecified: C80.1

## 2023-01-26 HISTORY — DX: Type 2 diabetes mellitus without complications: E11.9

## 2023-01-26 LAB — COMPREHENSIVE METABOLIC PANEL
ALT: 11 U/L (ref 0–44)
AST: 15 U/L (ref 15–41)
Albumin: 3.3 g/dL — ABNORMAL LOW (ref 3.5–5.0)
Alkaline Phosphatase: 50 U/L (ref 38–126)
Anion gap: 10 (ref 5–15)
BUN: 13 mg/dL (ref 8–23)
CO2: 25 mmol/L (ref 22–32)
Calcium: 9 mg/dL (ref 8.9–10.3)
Chloride: 103 mmol/L (ref 98–111)
Creatinine, Ser: 0.85 mg/dL (ref 0.44–1.00)
GFR, Estimated: >60 mL/min (ref >60)
Glucose, Bld: 229 mg/dL — ABNORMAL HIGH (ref 70–99)
Potassium: 3.8 mmol/L (ref 3.5–5.1)
Sodium: 138 mmol/L (ref 135–145)
Total Bilirubin: 0.5 mg/dL (ref 0.3–1.2)
Total Protein: 6.9 g/dL (ref 6.5–8.1)

## 2023-01-26 LAB — GLUCOSE, CAPILLARY: Glucose-Capillary: 194 mg/dL — ABNORMAL HIGH (ref 70–99)

## 2023-01-26 LAB — HEMOGLOBIN A1C
Hgb A1c MFr Bld: 6.7 % — ABNORMAL HIGH (ref 4.8–5.6)
Mean Plasma Glucose: 145.59 mg/dL

## 2023-01-27 ENCOUNTER — Ambulatory Visit (HOSPITAL_BASED_OUTPATIENT_CLINIC_OR_DEPARTMENT_OTHER): Payer: Medicare Other | Admitting: Anesthesiology

## 2023-01-27 ENCOUNTER — Ambulatory Visit (HOSPITAL_COMMUNITY): Payer: Medicare Other

## 2023-01-27 ENCOUNTER — Encounter (HOSPITAL_COMMUNITY)
Admission: RE | Disposition: A | Discharge status: Home / Self Care | Payer: Self-pay | Source: Home / Self Care | Attending: Urology

## 2023-01-27 ENCOUNTER — Encounter (HOSPITAL_COMMUNITY): Payer: Self-pay | Admitting: Urology

## 2023-01-27 ENCOUNTER — Ambulatory Visit (HOSPITAL_COMMUNITY): Payer: Medicare Other | Admitting: Anesthesiology

## 2023-01-27 ENCOUNTER — Ambulatory Visit (HOSPITAL_COMMUNITY)
Admission: RE | Admit: 2023-01-27 | Discharge: 2023-01-27 | Disposition: A | Discharge status: Home / Self Care | Payer: Medicare Other | Attending: Urology | Admitting: Urology

## 2023-01-27 DIAGNOSIS — N201 Calculus of ureter: Secondary | ICD-10-CM | POA: Diagnosis not present

## 2023-01-27 DIAGNOSIS — I10 Essential (primary) hypertension: Secondary | ICD-10-CM | POA: Insufficient documentation

## 2023-01-27 DIAGNOSIS — Z87442 Personal history of urinary calculi: Secondary | ICD-10-CM | POA: Insufficient documentation

## 2023-01-27 DIAGNOSIS — D649 Anemia, unspecified: Secondary | ICD-10-CM | POA: Diagnosis not present

## 2023-01-27 DIAGNOSIS — E119 Type 2 diabetes mellitus without complications: Secondary | ICD-10-CM | POA: Diagnosis not present

## 2023-01-27 HISTORY — PX: HOLMIUM LASER APPLICATION: SHX5852

## 2023-01-27 HISTORY — PX: CYSTOSCOPY WITH RETROGRADE PYELOGRAM, URETEROSCOPY AND STENT PLACEMENT: SHX5789

## 2023-01-27 LAB — CBC
HCT: 36.8 % (ref 36.0–46.0)
Hemoglobin: 11.5 g/dL — ABNORMAL LOW (ref 12.0–15.0)
MCH: 27.4 pg (ref 26.0–34.0)
MCHC: 31.3 g/dL (ref 30.0–36.0)
MCV: 87.6 fL (ref 80.0–100.0)
Platelets: 313 10*3/uL (ref 150–400)
RBC: 4.2 MIL/uL (ref 3.87–5.11)
RDW: 12.9 % (ref 11.5–15.5)
WBC: 5.7 10*3/uL (ref 4.0–10.5)
nRBC: 0 % (ref 0.0–0.2)

## 2023-01-27 LAB — GLUCOSE, CAPILLARY: Glucose-Capillary: 118 mg/dL — ABNORMAL HIGH (ref 70–99)

## 2023-01-27 SURGERY — CYSTOURETEROSCOPY, WITH RETROGRADE PYELOGRAM AND STENT INSERTION
Anesthesia: General | Laterality: Left

## 2023-01-27 MED ORDER — LIDOCAINE 2% (20 MG/ML) 5 ML SYRINGE
INTRAMUSCULAR | Status: DC | PRN
  Administered 2023-01-27: 60 mg via INTRAVENOUS

## 2023-01-27 MED ORDER — DEXAMETHASONE SODIUM PHOSPHATE 10 MG/ML IJ SOLN
INTRAMUSCULAR | Status: AC
  Filled 2023-01-27: qty 1

## 2023-01-27 MED ORDER — PROPOFOL 10 MG/ML IV BOLUS
INTRAVENOUS | Status: DC | PRN
  Administered 2023-01-27: 120 mg via INTRAVENOUS

## 2023-01-27 MED ORDER — TRAMADOL HCL 50 MG PO TABS: 50.0000 mg | ORAL_TABLET | Freq: Four times a day (QID) | ORAL | 0 refills | Status: AC | PRN

## 2023-01-27 MED ORDER — DEXAMETHASONE SODIUM PHOSPHATE 10 MG/ML IJ SOLN
INTRAMUSCULAR | Status: DC | PRN
  Administered 2023-01-27: 4 mg via INTRAVENOUS

## 2023-01-27 MED ORDER — SODIUM CHLORIDE 0.9 % IR SOLN
Status: DC | PRN
  Administered 2023-01-27: 3000 mL via INTRAVESICAL

## 2023-01-27 MED ORDER — FENTANYL CITRATE (PF) 100 MCG/2ML IJ SOLN
INTRAMUSCULAR | Status: DC | PRN
  Administered 2023-01-27: 25 ug via INTRAVENOUS
  Administered 2023-01-27: 50 ug via INTRAVENOUS
  Administered 2023-01-27: 25 ug via INTRAVENOUS

## 2023-01-27 MED ORDER — KETOROLAC TROMETHAMINE 10 MG PO TABS: 10.0000 mg | ORAL_TABLET | Freq: Three times a day (TID) | ORAL | 0 refills | Status: AC | PRN

## 2023-01-27 MED ORDER — ORAL CARE MOUTH RINSE: 15.0000 mL | Freq: Once | OROMUCOSAL | Status: AC

## 2023-01-27 MED ORDER — PHENYLEPHRINE 80 MCG/ML (10ML) SYRINGE FOR IV PUSH (FOR BLOOD PRESSURE SUPPORT)
PREFILLED_SYRINGE | INTRAVENOUS | Status: DC | PRN
  Administered 2023-01-27: 160 ug via INTRAVENOUS

## 2023-01-27 MED ORDER — ONDANSETRON HCL 4 MG/2ML IJ SOLN
INTRAMUSCULAR | Status: DC | PRN
  Administered 2023-01-27: 4 mg via INTRAVENOUS

## 2023-01-27 MED ORDER — PROPOFOL 10 MG/ML IV BOLUS
INTRAVENOUS | Status: AC
  Filled 2023-01-27: qty 20

## 2023-01-27 MED ORDER — ONDANSETRON HCL 4 MG/2ML IJ SOLN: 4.0000 mg | Freq: Once | INTRAMUSCULAR | Status: DC | PRN

## 2023-01-27 MED ORDER — IOHEXOL 300 MG/ML  SOLN
INTRAMUSCULAR | Status: DC | PRN
  Administered 2023-01-27: 17 mL via URETHRAL

## 2023-01-27 MED ORDER — LIDOCAINE HCL (PF) 2 % IJ SOLN
INTRAMUSCULAR | Status: AC
  Filled 2023-01-27: qty 5

## 2023-01-27 MED ORDER — ACETAMINOPHEN 500 MG PO TABS
1000.0000 mg | ORAL_TABLET | Freq: Once | ORAL | Status: AC
  Administered 2023-01-27: 1000 mg via ORAL
  Filled 2023-01-27: qty 2

## 2023-01-27 MED ORDER — CHLORHEXIDINE GLUCONATE 0.12 % MT SOLN
15.0000 mL | Freq: Once | OROMUCOSAL | Status: AC
  Administered 2023-01-27: 15 mL via OROMUCOSAL

## 2023-01-27 MED ORDER — FENTANYL CITRATE PF 50 MCG/ML IJ SOSY: 25.0000 ug | PREFILLED_SYRINGE | INTRAMUSCULAR | Status: DC | PRN

## 2023-01-27 MED ORDER — LACTATED RINGERS IV SOLN: INTRAVENOUS | Status: DC

## 2023-01-27 MED ORDER — ONDANSETRON HCL 4 MG/2ML IJ SOLN
INTRAMUSCULAR | Status: AC
  Filled 2023-01-27: qty 2

## 2023-01-27 MED ORDER — PHENYLEPHRINE 80 MCG/ML (10ML) SYRINGE FOR IV PUSH (FOR BLOOD PRESSURE SUPPORT)
PREFILLED_SYRINGE | INTRAVENOUS | Status: AC
  Filled 2023-01-27: qty 10

## 2023-01-27 MED ORDER — GENTAMICIN SULFATE 40 MG/ML IJ SOLN
5.0000 mg/kg | INTRAVENOUS | Status: AC
  Administered 2023-01-27: 310 mg via INTRAVENOUS
  Filled 2023-01-27: qty 7.75

## 2023-01-27 MED ORDER — FENTANYL CITRATE (PF) 100 MCG/2ML IJ SOLN
INTRAMUSCULAR | Status: AC
  Filled 2023-01-27: qty 2

## 2023-01-27 SURGICAL SUPPLY — 24 items
BAG URO CATCHER STRL LF (MISCELLANEOUS) ×1 IMPLANT
BASKET LASER NITINOL 1.9FR (BASKET) IMPLANT
BSKT STON RTRVL 120 1.9FR (BASKET)
CATH URETL OPEN END 6FR 70 (CATHETERS) ×1 IMPLANT
CLOTH BEACON ORANGE TIMEOUT ST (SAFETY) ×1 IMPLANT
EXTRACTOR STONE 1.7FRX115CM (UROLOGICAL SUPPLIES) IMPLANT
GLOVE SURG LX STRL 7.5 STRW (GLOVE) ×1 IMPLANT
GOWN STRL REUS W/ TWL XL LVL3 (GOWN DISPOSABLE) ×1 IMPLANT
GOWN STRL REUS W/TWL XL LVL3 (GOWN DISPOSABLE) ×2
GUIDEWIRE ANG ZIPWIRE 038X150 (WIRE) ×1 IMPLANT
GUIDEWIRE STR DUAL SENSOR (WIRE) ×1 IMPLANT
KIT TURNOVER KIT A (KITS) IMPLANT
LASER FIB FLEXIVA PULSE ID 365 (Laser) IMPLANT
LASER FIB FLEXIVA PULSE ID 550 (Laser) IMPLANT
LASER FIB FLEXIVA PULSE ID 910 (Laser) IMPLANT
MANIFOLD NEPTUNE II (INSTRUMENTS) ×1 IMPLANT
PACK CYSTO (CUSTOM PROCEDURE TRAY) ×1 IMPLANT
SHEATH NAVIGATOR HD 11/13X28 (SHEATH) IMPLANT
SHEATH NAVIGATOR HD 11/13X36 (SHEATH) IMPLANT
TRACTIP FLEXIVA PULS ID 200XHI (Laser) IMPLANT
TRACTIP FLEXIVA PULSE ID 200 (Laser)
TUBE PU 8FR 16IN ENFIT (TUBING) ×1 IMPLANT
TUBING CONNECTING 10 (TUBING) ×1 IMPLANT
TUBING UROLOGY SET (TUBING) ×1 IMPLANT

## 2023-01-27 NOTE — Op Note (Signed)
NAMERICKEY, POSTLE MEDICAL RECORD NO: [id number] ACCOUNT NO: [id number] DATE OF BIRTH: [birth year] FACILITY: [facility] LOCATION: [facility] PHYSICIAN: Sebastian Ache, MD  Operative Report   DATE OF PROCEDURE: 01/27/2023  PREOPERATIVE DIAGNOSIS:  Left ureteral stone.  POSTOPERATIVE DIAGNOSIS:  Interval passage of left ureteral stone.  PROCEDURE PERFORMED:   1.  Cystoscopy with bilateral retrograde pyelograms interpretation. 2.  Left diagnostic ureteroscopy.  ESTIMATED BLOOD LOSS:  Nil.  COMPLICATIONS:  None.  SPECIMEN:  None.  FINDINGS: 1.  Unremarkable bilateral retrograde pyelograms. 2.  No evidence of intraluminal stone within the bladder, left ureter, left kidney, likely suggesting interval passage of prior left distal stone.  INDICATIONS:  The patient is a 77 year old lady who was found on workup of colicky flank pain to have a 6 mm left distal ureteral stone by imaging and no vomit several weeks ago.  She presented to our office for evaluation and had significant colic  symptoms.  Given the distal location of the stone, options were discussed and she will proceed with ureteroscopy for definitive management.  Informed consent was obtained and placed in medical record.  PROCEDURE IN DETAIL:  The patient being identified and verified, procedure being left ureteroscopic stone manipulation was confirmed.  Procedure timeout was performed.  Intravenous antibiotics were administered.  General anesthesia was induced.  The  patient was placed into a low lithotomy position.  Sterile field was created, prepped and draped the patient's vagina, introitus, and proximal thighs using iodine.  Cystourethroscopy was performed using 21-French rigid cystoscope with offset lens.   Inspection of urinary bladder revealed no diverticula, calcifications, papillary lesions.  Ureteral orifices were singleton bilaterally.  The left ureteral orifice was cannulated with a 6-French end-hole catheter, and a left  retrograde pyelogram was  obtained.  Left retrograde pyelogram demonstrated single left ureter, single system left kidney.  There were no filling defects or narrowing noted whatsoever and no hydronephrosis. Very carefully I inspected the distal ureter and again I denoted no filling defects.   A ZIPwire was advanced to the level of the upper poles set aside as a safety wire.  An 8-French feeding tube placed in the urinary bladder for pressure release.  Next, semirigid ureteroscopy was performed of the distal left ureter alongside as a  separate sensory working wire.  There was no evidence of intraluminal stones seen whatsoever with inspection of the distal orifice left ureter alongside a separate sensor working wire.  There was no focal erythema, edema and certainly no evidence of  urolithiasis. Distal to this point likely represent likely interval passage of stone versus retrograde positioning of stone with retrograde pyelogram as the goal today was to verify stone free, the semirigid scope was exchanged for a short length  ureteral access sheath using continuous fluoroscopic guidance over the sensory working wire and flexible digital ureteroscopy was performed of the proximal left ureter and systematic inspection left kidney including all calyces x3.  Again, there was no  evidence of intraluminal stones seen whatsoever.  It is felt to likely represent interval passage.  The scope and access sheath removed under continuous vision, no significant mucosal abnormalities were found.  As the patient's imaging was at a referring  institution wanted to maximally verify stone free status and that there was no evidence of prior laterality assessment; therefore, I performed right pyelogram.  Right retrograde pyelogram demonstrated single right ureter, single system right kidney.  No filling defects or narrowing noted.  No hydronephrosis noted in every aspect of the evaluation today  and corroborated interval passage  of prior stone.  Given the  relatively atraumatic nature it was not felt that interval stenting would be warranted.  As such, the bladder was empty per cystoscope.  Procedure was then terminated.  The patient tolerated procedure well, no immediate perioperative complications.  The  patient was taken to postanesthesia care in stable condition.  Plan for discharge home.   PUS D: 01/27/2023 2:26:53 pm T: 01/27/2023 3:54:00 pm  JOB: [id number]/ [id number]

## 2023-01-27 NOTE — Anesthesia Postprocedure Evaluation (Signed)
Anesthesia Post Note  Patient: Shelby Fleming  Procedure(s) Performed: CYSTOSCOPY WITH BILATERAL RETROGRADE PYELOGRAM, LEFT DIAGNOSTIC URETEROSCOPY (Left) HOLMIUM LASER APPLICATION (Left)     Patient location during evaluation: PACU Anesthesia Type: General Level of consciousness: awake and alert Pain management: pain level controlled Vital Signs Assessment: post-procedure vital signs reviewed and stable Respiratory status: spontaneous breathing, nonlabored ventilation, respiratory function stable and patient connected to nasal cannula oxygen Cardiovascular status: blood pressure returned to baseline and stable Postop Assessment: no apparent nausea or vomiting Anesthetic complications: no   No notable events documented.  Last Vitals:  Vitals:   01/27/23 1515 01/27/23 1530  BP: 120/64 139/62  Pulse: 77 65  Resp: 17 18  Temp:  36.6 C  SpO2: 95% 92%    Last Pain:  Vitals:   01/27/23 1530  TempSrc:   PainSc: 0-No pain                 Collene Schlichter

## 2023-01-27 NOTE — Anesthesia Procedure Notes (Signed)
Procedure Name: LMA Insertion Date/Time: 01/27/2023 1:50 PM  Performed by: Pearson Grippe, CRNAPre-anesthesia Checklist: Patient identified, Emergency Drugs available, Suction available and Patient being monitored Patient Re-evaluated:Patient Re-evaluated prior to induction Oxygen Delivery Method: Circle system utilized Preoxygenation: Pre-oxygenation with 100% oxygen Induction Type: IV induction Ventilation: Mask ventilation without difficulty LMA: LMA inserted LMA Size: 4.0 Number of attempts: 1 Airway Equipment and Method: Bite block Placement Confirmation: positive ETCO2 Tube secured with: Tape Dental Injury: Teeth and Oropharynx as per pre-operative assessment

## 2023-01-27 NOTE — Anesthesia Preprocedure Evaluation (Addendum)
Anesthesia Evaluation  Patient identified by MRN, date of birth, ID band Patient awake    Reviewed: Allergy & Precautions, NPO status , Patient's Chart, lab work & pertinent test results  Airway Mallampati: II  TM Distance: >3 FB Neck ROM: Full    Dental  (+) Teeth Intact, Dental Advisory Given, Caps   Pulmonary neg pulmonary ROS   Pulmonary exam normal breath sounds clear to auscultation       Cardiovascular hypertension, Pt. on medications (-) angina (-) CAD, (-) Past MI and (-) Cardiac Stents Normal cardiovascular exam Rhythm:Regular Rate:Normal     Neuro/Psych negative neurological ROS  negative psych ROS   GI/Hepatic negative GI ROS, Neg liver ROS,,,  Endo/Other  diabetes, Type 2    Renal/GU Left ureteral stone      Musculoskeletal negative musculoskeletal ROS (+)    Abdominal   Peds  Hematology  (+) Blood dyscrasia, anemia   Anesthesia Other Findings Day of surgery medications reviewed with the patient.  Reproductive/Obstetrics                             Anesthesia Physical Anesthesia Plan  ASA: 2  Anesthesia Plan: General   Post-op Pain Management: Tylenol PO (pre-op)*   Induction: Intravenous  PONV Risk Score and Plan: 4 or greater and Dexamethasone, Ondansetron and Treatment may vary due to age or medical condition  Airway Management Planned: LMA  Additional Equipment:   Intra-op Plan:   Post-operative Plan: Extubation in OR  Informed Consent: I have reviewed the patients History and Physical, chart, labs and discussed the procedure including the risks, benefits and alternatives for the proposed anesthesia with the patient or authorized representative who has indicated his/her understanding and acceptance.     Dental advisory given  Plan Discussed with: CRNA  Anesthesia Plan Comments:        Anesthesia Quick Evaluation

## 2023-01-27 NOTE — Transfer of Care (Signed)
Immediate Anesthesia Transfer of Care Note  Patient: Shelby Fleming  Procedure(s) Performed: CYSTOSCOPY WITH BILATERAL RETROGRADE PYELOGRAM, LEFT DIAGNOSTIC URETEROSCOPY (Left) HOLMIUM LASER APPLICATION (Left)  Patient Location: PACU  Anesthesia Type:General  Level of Consciousness: awake, alert , and oriented  Airway & Oxygen Therapy: Patient Spontanous Breathing and Patient connected to face mask oxygen  Post-op Assessment: Report given to RN and Post -op Vital signs reviewed and stable  Post vital signs: Reviewed and stable  Last Vitals:  Vitals Value Taken Time  BP 120/57 01/27/23 1435  Temp    Pulse 84 01/27/23 1436  Resp 25 01/27/23 1436  SpO2 100 % 01/27/23 1436  Vitals shown include unvalidated device data.  Last Pain:  Vitals:   01/27/23 1144  TempSrc:   PainSc: 0-No pain         Complications: No notable events documented.

## 2023-01-27 NOTE — Brief Op Note (Signed)
01/27/2023  2:21 PM  PATIENT:  Ilda Foil  77 y.o. female  PRE-OPERATIVE DIAGNOSIS:  LEFT URETERAL STONE  POST-OPERATIVE DIAGNOSIS:  left ureteral stone (interval passage)  PROCEDURE:  Cysto, bilateralretrogrades, left diagnostic ureteroscopy  SURGEON:  Surgeon(s) and Role:    * Nathalya Wolanski, Delbert Phenix., MD - Primary  PHYSICIAN ASSISTANT:   ASSISTANTS: none   ANESTHESIA:   general  EBL:  minimal   BLOOD ADMINISTERED:none  DRAINS: none   LOCAL MEDICATIONS USED:  NONE  SPECIMEN:  No Specimen  DISPOSITION OF SPECIMEN:  N/A  COUNTS:  YES  TOURNIQUET:  * No tourniquets in log *  DICTATION: .Other Dictation: Dictation Number [id number]  PLAN OF CARE: Discharge to home after PACU  PATIENT DISPOSITION:  PACU - hemodynamically stable.   Delay start of Pharmacological VTE agent (>24hrs) due to surgical blood loss or risk of bleeding: yes

## 2023-01-27 NOTE — Discharge Instructions (Addendum)
1 - You may have urinary urgency (bladder spasms) and bloody urine on / off for up to a week. This is normal.  2 - Call MD or go to ER for fever >102, severe pain / nausea / vomiting not relieved by medications, or acute change in medical status  

## 2023-01-27 NOTE — H&P (Signed)
Shelby Fleming is an 77 y.o. female.    Chief Complaint: Pre-Op LEFT Ureteroscopic Stone Manipulation  HPI:   1 - Left Distal Ureteral Stone - left 6mm UVJ stone on imaging at [facility] 01/14/23. NO additional stones mentioned. Given trial of medical therapy with alpha blockers. Btu no interval passate. UA 5/14 w/o infectious parameters. ??  PMH sig for C spine fracture after fall (no deficits after surgery and rehab), HTN. NO CV disease / blood thinners. Her daughter Shelby Fleming is very involved. Her PCP is Farris Has MD with [facility].   ??Today "Shelby Fleming" is seen to proceed with LEFT ureteroscopic stone manipulation. No intervla fevers. Most recent UA without infectious parameters.     Past Medical History:  Diagnosis Date   Cancer (HCC)    melonoma left shin removed   Diabetes mellitus without complication (HCC)    History of kidney stones    Hypertension    Psoriasis     Past Surgical History:  Procedure Laterality Date   ANTERIOR CERVICAL DECOMP/DISCECTOMY FUSION N/A 02/02/2022   Procedure: CERVICAL FOUR CORPECTOMY WITH PLATE REMOVAL;  Surgeon: Bedelia Person, MD;  Location: [facility] OR;  Service: Neurosurgery;  Laterality: N/A;   CERVICAL SPINE SURGERY     HYSTEROSCOPY      No family history on file. Social History:  reports that she has never smoked. She has never used smokeless tobacco. She reports that she does not drink alcohol and does not use drugs.  Allergies:  Allergies  Allergen Reactions   Atorvastatin Other (See Comments)    Muscle weakness   Codeine Nausea Only   Ezetimibe Other (See Comments)    Muscle weakness   Pitavastatin Other (See Comments)    Muscle weakness   Pravastatin Other (See Comments)    Weakness    Rosuvastatin Other (See Comments)    Muscle weakness    No medications prior to admission.    Results for orders placed or performed during the hospital encounter of 01/26/23 (from the past 48 hour(s))  Hemoglobin A1c per protocol     Status: Abnormal    Collection Time: 01/26/23  1:49 PM  Result Value Ref Range   Hgb A1c MFr Bld 6.7 (H) 4.8 - 5.6 %    Comment: (NOTE) Pre diabetes:          5.7%-6.4%  Diabetes:              >6.4%  Glycemic control for   <7.0% adults with diabetes    Mean Plasma Glucose 145.59 mg/dL    Comment: Performed at [facility], [address] [place], [place] [address]  Comprehensive metabolic panel per protocol     Status: Abnormal   Collection Time: 01/26/23  1:49 PM  Result Value Ref Range   Sodium 138 135 - 145 mmol/L   Potassium 3.8 3.5 - 5.1 mmol/L   Chloride 103 98 - 111 mmol/L   CO2 25 22 - 32 mmol/L   Glucose, Bld 229 (H) 70 - 99 mg/dL    Comment: Glucose reference range applies only to samples taken after fasting for at least 8 hours.   BUN 13 8 - 23 mg/dL   Creatinine, Ser 3.66 0.44 - 1.00 mg/dL   Calcium 9.0 8.9 - 44.0 mg/dL   Total Protein 6.9 6.5 - 8.1 g/dL   Albumin 3.3 (L) 3.5 - 5.0 g/dL   AST 15 15 - 41 U/L   ALT 11 0 - 44 U/L   Alkaline  Phosphatase 50 38 - 126 U/L   Total Bilirubin 0.5 0.3 - 1.2 mg/dL   GFR, Estimated >16 >10 mL/min    Comment: (NOTE) Calculated using the CKD-EPI Creatinine Equation (2021)    Anion gap 10 5 - 15    Comment: Performed at [facility], [address], [place], [place] [address]  Glucose, capillary     Status: Abnormal   Collection Time: 01/26/23  2:18 PM  Result Value Ref Range   Glucose-Capillary 194 (H) 70 - 99 mg/dL    Comment: Glucose reference range applies only to samples taken after fasting for at least 8 hours.   No results found.  Review of Systems  Constitutional:  Negative for chills and fever.  Genitourinary:  Positive for flank pain.  All other systems reviewed and are negative.   There were no vitals taken for this visit. Physical Exam Vitals reviewed.  Eyes:     Pupils: Pupils are equal, round, and reactive to light.  Cardiovascular:     Rate and Rhythm: Normal rate.  Pulmonary:     Effort:  Pulmonary effort is normal.  Abdominal:     General: Abdomen is flat.  Genitourinary:    Comments: Mild Left CVAT at present Musculoskeletal:     Cervical back: Normal range of motion.  Neurological:     General: No focal deficit present.     Mental Status: She is alert.  Psychiatric:        Mood and Affect: Mood normal.      Assessment/Plan  Proceed as planned with LEFT ureteroscopic stone manipulation. Risks, benefits, alternatives, expected peri-op course discussed previously and reiterated today   Loletta Parish., MD 01/27/2023, 5:49 AM

## 2023-01-28 ENCOUNTER — Encounter (HOSPITAL_COMMUNITY): Payer: Self-pay | Admitting: Urology

## 2023-02-10 DIAGNOSIS — L400 Psoriasis vulgaris: Secondary | ICD-10-CM | POA: Diagnosis not present

## 2023-02-10 DIAGNOSIS — D692 Other nonthrombocytopenic purpura: Secondary | ICD-10-CM | POA: Diagnosis not present

## 2023-02-10 DIAGNOSIS — D225 Melanocytic nevi of trunk: Secondary | ICD-10-CM | POA: Diagnosis not present

## 2023-02-10 DIAGNOSIS — D2261 Melanocytic nevi of right upper limb, including shoulder: Secondary | ICD-10-CM | POA: Diagnosis not present

## 2023-02-10 DIAGNOSIS — Z85820 Personal history of malignant melanoma of skin: Secondary | ICD-10-CM | POA: Diagnosis not present

## 2023-02-10 DIAGNOSIS — L821 Other seborrheic keratosis: Secondary | ICD-10-CM | POA: Diagnosis not present

## 2023-02-10 DIAGNOSIS — L814 Other melanin hyperpigmentation: Secondary | ICD-10-CM | POA: Diagnosis not present

## 2023-02-10 DIAGNOSIS — Z85828 Personal history of other malignant neoplasm of skin: Secondary | ICD-10-CM | POA: Diagnosis not present

## 2023-02-10 DIAGNOSIS — D2262 Melanocytic nevi of left upper limb, including shoulder: Secondary | ICD-10-CM | POA: Diagnosis not present

## 2023-02-13 ENCOUNTER — Emergency Department (HOSPITAL_BASED_OUTPATIENT_CLINIC_OR_DEPARTMENT_OTHER): Payer: Medicare Other

## 2023-02-13 ENCOUNTER — Encounter (HOSPITAL_BASED_OUTPATIENT_CLINIC_OR_DEPARTMENT_OTHER): Payer: Self-pay

## 2023-02-13 ENCOUNTER — Other Ambulatory Visit: Payer: Self-pay

## 2023-02-13 ENCOUNTER — Emergency Department (HOSPITAL_BASED_OUTPATIENT_CLINIC_OR_DEPARTMENT_OTHER)
Admission: EM | Admit: 2023-02-13 | Discharge: 2023-02-13 | Disposition: A | Discharge status: Home / Self Care | Payer: Medicare Other | Attending: Emergency Medicine | Admitting: Emergency Medicine

## 2023-02-13 DIAGNOSIS — N281 Cyst of kidney, acquired: Secondary | ICD-10-CM | POA: Diagnosis not present

## 2023-02-13 DIAGNOSIS — M5136 Other intervertebral disc degeneration, lumbar region: Secondary | ICD-10-CM | POA: Diagnosis not present

## 2023-02-13 DIAGNOSIS — K8020 Calculus of gallbladder without cholecystitis without obstruction: Secondary | ICD-10-CM | POA: Diagnosis not present

## 2023-02-13 DIAGNOSIS — K5730 Diverticulosis of large intestine without perforation or abscess without bleeding: Secondary | ICD-10-CM

## 2023-02-13 DIAGNOSIS — R109 Unspecified abdominal pain: Secondary | ICD-10-CM

## 2023-02-13 DIAGNOSIS — N390 Urinary tract infection, site not specified: Secondary | ICD-10-CM | POA: Insufficient documentation

## 2023-02-13 LAB — CBC
HCT: 40.1 % (ref 36.0–46.0)
Hemoglobin: 12.6 g/dL (ref 12.0–15.0)
MCH: 27 pg (ref 26.0–34.0)
MCHC: 31.4 g/dL (ref 30.0–36.0)
MCV: 86.1 fL (ref 80.0–100.0)
Platelets: 272 10*3/uL (ref 150–400)
RBC: 4.66 MIL/uL (ref 3.87–5.11)
RDW: 13.5 % (ref 11.5–15.5)
WBC: 9.5 10*3/uL (ref 4.0–10.5)
nRBC: 0 % (ref 0.0–0.2)

## 2023-02-13 LAB — BASIC METABOLIC PANEL
Anion gap: 11 (ref 5–15)
BUN: 16 mg/dL (ref 8–23)
CO2: 25 mmol/L (ref 22–32)
Calcium: 9.6 mg/dL (ref 8.9–10.3)
Chloride: 100 mmol/L (ref 98–111)
Creatinine, Ser: 0.82 mg/dL (ref 0.44–1.00)
GFR, Estimated: >60 mL/min (ref >60)
Glucose, Bld: 183 mg/dL — ABNORMAL HIGH (ref 70–99)
Potassium: 4.1 mmol/L (ref 3.5–5.1)
Sodium: 136 mmol/L (ref 135–145)

## 2023-02-13 LAB — URINALYSIS, ROUTINE W REFLEX MICROSCOPIC

## 2023-02-13 LAB — URINALYSIS, MICROSCOPIC (REFLEX)
Bacteria, UA: NONE SEEN
Squamous Epithelial / HPF: NONE SEEN /HPF (ref 0–5)
WBC, UA: >50 WBC/hpf (ref 0–5)

## 2023-02-13 MED ORDER — SODIUM CHLORIDE 0.9 % IV SOLN
1.0000 g | Freq: Once | INTRAVENOUS | Status: AC
  Administered 2023-02-13: 1 g via INTRAVENOUS
  Filled 2023-02-13: qty 10

## 2023-02-13 MED ORDER — MORPHINE SULFATE (PF) 2 MG/ML IV SOLN
2.0000 mg | Freq: Once | INTRAVENOUS | Status: AC
  Administered 2023-02-13: 2 mg via INTRAVENOUS
  Filled 2023-02-13: qty 1

## 2023-02-13 MED ORDER — CEPHALEXIN 500 MG PO CAPS: 500.0000 mg | ORAL_CAPSULE | Freq: Four times a day (QID) | ORAL | 0 refills | Status: AC

## 2023-02-13 MED ORDER — ONDANSETRON HCL 4 MG/2ML IJ SOLN
4.0000 mg | Freq: Once | INTRAMUSCULAR | Status: AC
  Administered 2023-02-13: 4 mg via INTRAVENOUS
  Filled 2023-02-13: qty 2

## 2023-02-13 NOTE — ED Notes (Signed)
Pt transported to CT ?

## 2023-02-13 NOTE — ED Triage Notes (Signed)
C/o left flank pain and urinary frequency starting this morning.   Hx kidney stone.

## 2023-02-13 NOTE — Discharge Instructions (Addendum)
It was our pleasure to provide your ER care today - we hope that you feel better. Drink plenty of fluids/stay well hydrated.  Your urine test shows a possible urine infection - take antibiotic (keflex) as prescribed. Take acetaminophen or ibuprofen as need.   Your ct scan shows no kidney stone. 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   Follow up with primary care doctor in the coming week if symptoms fail to improve/resolve.  Return to ER if worse, new symptoms, high fevers, new/severe/worsening pain, persistent vomiting, or other concern.

## 2023-02-13 NOTE — ED Notes (Signed)
Took AZO, reason for discoloration of urine.

## 2023-02-13 NOTE — ED Provider Notes (Signed)
[facility] Provider Note   CSN: [id number] Arrival date & time: 02/13/23  1736     History  Chief Complaint  Patient presents with   Flank Pain    Shelby Fleming is a 77 y.o. female.  Pt c/o left flank pain. Symptoms acute onset this AM, dull pain, lower lower flank, non radiating. Unsure if same as prior kidney stone pain - last had stone one month ago. Denies dysuria or hematuria, mild urinary frequency this AM. No 'anterior' pain, no abd or pelvic pain. Had 1-2 episodes of emesis, not bloody or bilious. Has bm this AM. No fever or chills. No back injury or strain. No radicular pain. No numbness/weakness.   The history is provided by the patient, a relative and medical records.  Flank Pain Pertinent negatives include no chest pain, no abdominal pain, no headaches and no shortness of breath.       Home Medications Prior to Admission medications   Medication Sig Start Date End Date Taking? Authorizing Provider  cephALEXin (KEFLEX) 500 MG capsule Take 1 capsule (500 mg total) by mouth 4 (four) times daily. 02/14/23  Yes Cathren Laine, MD  acetaminophen (TYLENOL) 500 MG tablet Take 500 mg by mouth every 6 (six) hours as needed for moderate pain.    [provider]  ALPRAZolam Prudy Feeler) 0.25 MG tablet Take 0.125-0.25 mg by mouth daily as needed for anxiety or sleep. 01/13/23   [provider]  Apremilast (OTEZLA) 30 MG TABS Take 30 mg by mouth 2 (two) times daily.    [provider]  cephALEXin (KEFLEX) 500 MG capsule Take 500 mg by mouth 2 (two) times daily.    [provider]  D 1000 25 MCG (1000 UT) capsule Take 1 capsule (1,000 Units total) by mouth daily. 02/24/22   Angiulli, Mcarthur Rossetti, PA-C  docusate sodium (COLACE) 100 MG capsule Take 1 capsule (100 mg total) by mouth 2 (two) times daily. Patient taking differently: Take 100 mg by mouth daily as needed for mild constipation. 02/24/22   Angiulli, Mcarthur Rossetti,  PA-C  halobetasol (ULTRAVATE) 0.05 % cream Apply 1 Application topically 2 (two) times daily as needed (psoriasis).    [provider]  ketorolac (TORADOL) 10 MG tablet Take 1 tablet (10 mg total) by mouth every 8 (eight) hours as needed for moderate pain (post-operatively). 01/27/23   Loletta Parish., MD  lisinopril (ZESTRIL) 40 MG tablet Take 40 mg by mouth daily.    [provider]  loratadine (CLARITIN) 10 MG tablet Take 1 tablet (10 mg total) by mouth daily. 02/24/22   Angiulli, Mcarthur Rossetti, PA-C  omega-3 acid ethyl esters (LOVAZA) 1 g capsule Take 2 capsules (2 g total) by mouth 2 (two) times daily. 02/24/22   Angiulli, Mcarthur Rossetti, PA-C  ondansetron (ZOFRAN-ODT) 4 MG disintegrating tablet Take 1 tablet (4 mg total) by mouth every 8 (eight) hours as needed for nausea or vomiting. 01/11/23   Waldon Merl, PA-C  polyethylene glycol (MIRALAX / GLYCOLAX) 17 g packet Take 17 g by mouth daily. Patient taking differently: Take 8.5 g by mouth daily as needed for moderate constipation. 02/24/22   Angiulli, Mcarthur Rossetti, PA-C  Probiotic Product (CULTURELLE PROBIOTICS PO) Take 1 capsule by mouth daily.    [provider]  traMADol (ULTRAM) 50 MG tablet Take 1-2 tablets (50-100 mg total) by mouth every 6 (six) hours as needed for severe pain (post-operatively). 01/27/23 01/27/24  Gypsy Balsam  Montez Hageman., MD      Allergies    Atorvastatin, Codeine, Ezetimibe, Pitavastatin, Pravastatin, and Rosuvastatin    Review of Systems   Review of Systems  Constitutional:  Negative for chills and fever.  Eyes:  Negative for redness.  Respiratory:  Negative for cough and shortness of breath.   Cardiovascular:  Negative for chest pain.  Gastrointestinal:  Positive for nausea and vomiting. Negative for abdominal pain, constipation and diarrhea.  Genitourinary:  Positive for flank pain and frequency. Negative for dysuria and pelvic pain.  Skin:  Negative for rash.  Neurological:  Negative for  weakness, numbness and headaches.  Hematological:  Does not bruise/bleed easily.  Psychiatric/Behavioral:  Negative for confusion.     Physical Exam Updated Vital Signs BP 134/63   Pulse 89   Temp 98.5 F (36.9 C) (Oral)   Resp 18   Ht 1.6 m (5\' 3" )   Wt 78 kg   SpO2 97%   BMI 30.47 kg/m  Physical Exam Vitals and nursing note reviewed.  Constitutional:      Appearance: Normal appearance. She is well-developed.  HENT:     Head: Atraumatic.     Nose: Nose normal.     Mouth/Throat:     Mouth: Mucous membranes are moist.  Eyes:     General: No scleral icterus.    Conjunctiva/sclera: Conjunctivae normal.  Neck:     Trachea: No tracheal deviation.  Cardiovascular:     Rate and Rhythm: Normal rate and regular rhythm.     Pulses: Normal pulses.     Heart sounds: Normal heart sounds. No murmur heard.    No friction rub. No gallop.  Pulmonary:     Effort: Pulmonary effort is normal. No respiratory distress.     Breath sounds: Normal breath sounds.  Abdominal:     General: Bowel sounds are normal. There is no distension.     Palpations: Abdomen is soft. There is no mass.     Tenderness: There is no abdominal tenderness. There is no guarding.  Genitourinary:    Comments: No cva tenderness.  Musculoskeletal:        General: No swelling.     Cervical back: Neck supple. No muscular tenderness.     Comments: T/L/S spine non tender, aligned. No back/flank sts or skin changes/lesions.   Skin:    General: Skin is warm and dry.     Findings: No rash.  Neurological:     Mental Status: She is alert.     Comments: Alert, speech normal.   Psychiatric:        Mood and Affect: Mood normal.     ED Results / Procedures / Treatments   Labs (all labs ordered are listed, but only abnormal results are displayed) Results for orders placed or performed during the hospital encounter of 02/13/23  Urinalysis, Routine w reflex microscopic -Urine, Clean Catch  Result Value Ref Range    Color, Urine ORANGE (A) YELLOW   APPearance HAZY (A) CLEAR   Specific Gravity, Urine  1.005 - 1.030    TEST NOT REPORTED DUE TO COLOR INTERFERENCE OF URINE PIGMENT   pH  5.0 - 8.0    TEST NOT REPORTED DUE TO COLOR INTERFERENCE OF URINE PIGMENT   Glucose, UA (A) NEGATIVE mg/dL    TEST NOT REPORTED DUE TO COLOR INTERFERENCE OF URINE PIGMENT   Hgb urine dipstick (A) NEGATIVE    TEST NOT REPORTED DUE TO COLOR INTERFERENCE OF URINE PIGMENT   Bilirubin Urine (  A) NEGATIVE    TEST NOT REPORTED DUE TO COLOR INTERFERENCE OF URINE PIGMENT   Ketones, ur (A) NEGATIVE mg/dL    TEST NOT REPORTED DUE TO COLOR INTERFERENCE OF URINE PIGMENT   Protein, ur (A) NEGATIVE mg/dL    TEST NOT REPORTED DUE TO COLOR INTERFERENCE OF URINE PIGMENT   Nitrite (A) NEGATIVE    TEST NOT REPORTED DUE TO COLOR INTERFERENCE OF URINE PIGMENT   Leukocytes,Ua (A) NEGATIVE    TEST NOT REPORTED DUE TO COLOR INTERFERENCE OF URINE PIGMENT  Basic metabolic panel  Result Value Ref Range   Sodium 136 135 - 145 mmol/L   Potassium 4.1 3.5 - 5.1 mmol/L   Chloride 100 98 - 111 mmol/L   CO2 25 22 - 32 mmol/L   Glucose, Bld 183 (H) 70 - 99 mg/dL   BUN 16 8 - 23 mg/dL   Creatinine, Ser 1.61 0.44 - 1.00 mg/dL   Calcium 9.6 8.9 - 09.6 mg/dL   GFR, Estimated >04 >54 mL/min   Anion gap 11 5 - 15  CBC  Result Value Ref Range   WBC 9.5 4.0 - 10.5 K/uL   RBC 4.66 3.87 - 5.11 MIL/uL   Hemoglobin 12.6 12.0 - 15.0 g/dL   HCT 09.8 11.9 - 14.7 %   MCV 86.1 80.0 - 100.0 fL   MCH 27.0 26.0 - 34.0 pg   MCHC 31.4 30.0 - 36.0 g/dL   RDW 82.9 56.2 - 13.0 %   Platelets 272 150 - 400 K/uL   nRBC 0.0 0.0 - 0.2 %  Urinalysis, Microscopic (reflex)  Result Value Ref Range   RBC / HPF 0-5 0 - 5 RBC/hpf   WBC, UA >50 0 - 5 WBC/hpf   Bacteria, UA NONE SEEN NONE SEEN   Squamous Epithelial / HPF NONE SEEN 0 - 5 /HPF   CT Renal Stone Study  Result Date: 02/13/2023 CLINICAL DATA:  Left-sided flank pain with urinary frequency. History of kidney  stones EXAM: CT ABDOMEN AND PELVIS WITHOUT CONTRAST TECHNIQUE: Multidetector CT imaging of the abdomen and pelvis was performed following the standard protocol without IV contrast. RADIATION DOSE REDUCTION: This exam was performed according to the departmental dose-optimization program which includes automated exposure control, adjustment of the mA and/or kV according to patient size and/or use of iterative reconstruction technique. COMPARISON:  None Available. FINDINGS: Lower chest: Coronary artery calcifications are seen. There is some linear opacity lung bases likely scar or atelectasis. No pleural effusion. Slight elevation of the right hemidiaphragm. Hepatobiliary: On this non IV contrast exam, the liver is grossly preserved. Dependent stone in the gallbladder. Pancreas: Moderate atrophy of the pancreas.  No obvious mass. Spleen: Normal in size without focal abnormality. Adrenals/Urinary Tract: Adrenal glands are preserved. No abnormal calcifications seen within the right kidney nor along the course of the right ureter. The right ureter has a normal course and caliber down to the bladder. Preserved contours of the urinary bladder which is underdistended. Lateral midportion left-sided renal cysts identified measuring 3.1 cm in diameter with Hounsfield unit of 16. No specific imaging follow-up. Nonspecific perinephric stranding with mild collecting system dilatation. The ureters dilated down to the UVJ. No clear stone identified along the course of the ureter. No bladder stone identified. No intrarenal collecting system stone. Stomach/Bowel: No oral contrast. Normal appendix extends inferior to the cecum in the right lower quadrant. Large bowel has a normal course and caliber with some scattered left-sided colonic diverticula greatest in the sigmoid colon. The  stomach and small bowel are nondilated as well. Vascular/Lymphatic: Aortic atherosclerosis. No enlarged abdominal or pelvic lymph nodes. Reproductive:  Uterus and bilateral adnexa are unremarkable. Other: No free intra-air or free fluid. Tiny fat containing umbilical hernia. Small inguinal fat containing hernias. Musculoskeletal: Osteopenia. Multifocal areas of degenerative changes along the spine. Degenerative changes of the pelvis. Trace anterolisthesis of L4 on L5 with some disc bulging. IMPRESSION: Diffusely dilated left renal collecting system. However no clear stones are seen within the kidney nor along the expected course of the left ureter nor in the bladder. Etiology of the dilatation is uncertain. Please correlate for a passed stone. Otherwise further workup is recommended with postcontrast imaging with delays to evaluate the collecting systems when appropriate. Colonic diverticulosis. Gallstone Electronically Signed   By: Karen Kays M.D.   On: 02/13/2023 19:28   DG C-Arm 1-60 Min-No Report  Result Date: 01/27/2023 Fluoroscopy was utilized by the requesting physician.  No radiographic interpretation.     EKG None  Radiology CT Renal Stone Study  Result Date: 02/13/2023 CLINICAL DATA:  Left-sided flank pain with urinary frequency. History of kidney stones EXAM: CT ABDOMEN AND PELVIS WITHOUT CONTRAST TECHNIQUE: Multidetector CT imaging of the abdomen and pelvis was performed following the standard protocol without IV contrast. RADIATION DOSE REDUCTION: This exam was performed according to the departmental dose-optimization program which includes automated exposure control, adjustment of the mA and/or kV according to patient size and/or use of iterative reconstruction technique. COMPARISON:  None Available. FINDINGS: Lower chest: Coronary artery calcifications are seen. There is some linear opacity lung bases likely scar or atelectasis. No pleural effusion. Slight elevation of the right hemidiaphragm. Hepatobiliary: On this non IV contrast exam, the liver is grossly preserved. Dependent stone in the gallbladder. Pancreas: Moderate atrophy of  the pancreas.  No obvious mass. Spleen: Normal in size without focal abnormality. Adrenals/Urinary Tract: Adrenal glands are preserved. No abnormal calcifications seen within the right kidney nor along the course of the right ureter. The right ureter has a normal course and caliber down to the bladder. Preserved contours of the urinary bladder which is underdistended. Lateral midportion left-sided renal cysts identified measuring 3.1 cm in diameter with Hounsfield unit of 16. No specific imaging follow-up. Nonspecific perinephric stranding with mild collecting system dilatation. The ureters dilated down to the UVJ. No clear stone identified along the course of the ureter. No bladder stone identified. No intrarenal collecting system stone. Stomach/Bowel: No oral contrast. Normal appendix extends inferior to the cecum in the right lower quadrant. Large bowel has a normal course and caliber with some scattered left-sided colonic diverticula greatest in the sigmoid colon. The stomach and small bowel are nondilated as well. Vascular/Lymphatic: Aortic atherosclerosis. No enlarged abdominal or pelvic lymph nodes. Reproductive: Uterus and bilateral adnexa are unremarkable. Other: No free intra-air or free fluid. Tiny fat containing umbilical hernia. Small inguinal fat containing hernias. Musculoskeletal: Osteopenia. Multifocal areas of degenerative changes along the spine. Degenerative changes of the pelvis. Trace anterolisthesis of L4 on L5 with some disc bulging. IMPRESSION: Diffusely dilated left renal collecting system. However no clear stones are seen within the kidney nor along the expected course of the left ureter nor in the bladder. Etiology of the dilatation is uncertain. Please correlate for a passed stone. Otherwise further workup is recommended with postcontrast imaging with delays to evaluate the collecting systems when appropriate. Colonic diverticulosis. Gallstone Electronically Signed   By: Karen Kays  M.D.   On: 02/13/2023 19:28  Procedures Procedures    Medications Ordered in ED Medications  morphine (PF) 2 MG/ML injection 2 mg (2 mg Intravenous Given 02/13/23 1852)  ondansetron (ZOFRAN) injection 4 mg (4 mg Intravenous Given 02/13/23 1851)  cefTRIAXone (ROCEPHIN) 1 g in sodium chloride 0.9 % 100 mL IVPB (0 g Intravenous Stopped 02/13/23 2043)    ED Course/ Medical Decision Making/ A&P                             Medical Decision Making Problems Addressed: Acute UTI: acute illness or injury with systemic symptoms that poses a threat to life or bodily functions Diverticula, colon: chronic illness or injury Gallstones: chronic illness or injury Left flank pain: acute illness or injury with systemic symptoms that poses a threat to life or bodily functions Lumbar degenerative disc disease: chronic illness or injury  Amount and/or Complexity of Data Reviewed Independent Historian:     Details: Family, hx External Data Reviewed: notes. Labs: ordered. Decision-making details documented in ED Course. Radiology: ordered and independent interpretation performed. Decision-making details documented in ED Course.  Risk Prescription drug management. Parenteral controlled substances. Decision regarding hospitalization.   Iv ns. Continuous pulse ox and cardiac monitoring. Labs ordered/sent. Imaging ordered.   Differential diagnosis includes renal colic, pyelo, ureteral stone, etc. Dispo decision including potential need for admission considered - will get labs and imaging and reassess.   Reviewed nursing notes and prior charts for additional history. External reports reviewed.  Recent cystoscopy/ureteroscopy - no obstructing stone then. Additional history from: family.   Cardiac monitor: sinus rhythm, rate 88.  Morphine iv. Zofran iv.   Labs reviewed/interpreted by me - wbc and hgb normal. Glucose mildly high. UA > 50 wbc. Rocephin iv.   CT reviewed/interpreted by me - ureter  dilated, no obstructing stone. Several incidental findings discussed w, and shared w, patient  Pt tolerating po, no vomiting. Overall pt is non-toxic, well appearing, with normal vital signs.   Rec close pcp f/u.  Return precautions provided.          Final Clinical Impression(s) / ED Diagnoses Final diagnoses:  Left flank pain  Acute UTI  Gallstones  Diverticula, colon  Lumbar degenerative disc disease    Rx / DC Orders ED Discharge Orders          Ordered    cephALEXin (KEFLEX) 500 MG capsule  4 times daily        02/13/23 1949              Cathren Laine, MD 02/13/23 2107

## 2023-02-16 DIAGNOSIS — N202 Calculus of kidney with calculus of ureter: Secondary | ICD-10-CM | POA: Diagnosis not present

## 2023-02-25 ENCOUNTER — Other Ambulatory Visit (HOSPITAL_COMMUNITY): Payer: Self-pay | Admitting: Gastroenterology

## 2023-02-25 DIAGNOSIS — K5900 Constipation, unspecified: Secondary | ICD-10-CM | POA: Diagnosis not present

## 2023-02-25 DIAGNOSIS — R194 Change in bowel habit: Secondary | ICD-10-CM | POA: Diagnosis not present

## 2023-02-25 DIAGNOSIS — R1013 Epigastric pain: Secondary | ICD-10-CM

## 2023-03-10 DIAGNOSIS — K5730 Diverticulosis of large intestine without perforation or abscess without bleeding: Secondary | ICD-10-CM | POA: Diagnosis not present

## 2023-03-10 DIAGNOSIS — D125 Benign neoplasm of sigmoid colon: Secondary | ICD-10-CM | POA: Diagnosis not present

## 2023-03-10 DIAGNOSIS — K635 Polyp of colon: Secondary | ICD-10-CM | POA: Diagnosis not present

## 2023-03-10 DIAGNOSIS — R194 Change in bowel habit: Secondary | ICD-10-CM | POA: Diagnosis not present

## 2023-03-10 DIAGNOSIS — Z86010 Personal history of colonic polyps: Secondary | ICD-10-CM | POA: Diagnosis not present

## 2023-03-10 DIAGNOSIS — K648 Other hemorrhoids: Secondary | ICD-10-CM | POA: Diagnosis not present

## 2023-03-10 DIAGNOSIS — K317 Polyp of stomach and duodenum: Secondary | ICD-10-CM | POA: Diagnosis not present

## 2023-03-10 DIAGNOSIS — K2950 Unspecified chronic gastritis without bleeding: Secondary | ICD-10-CM | POA: Diagnosis not present

## 2023-03-10 DIAGNOSIS — R1013 Epigastric pain: Secondary | ICD-10-CM | POA: Diagnosis not present

## 2023-03-12 ENCOUNTER — Ambulatory Visit (HOSPITAL_COMMUNITY)
Admission: RE | Admit: 2023-03-12 | Discharge: 2023-03-12 | Disposition: A | Discharge status: Home / Self Care | Payer: Medicare Other | Source: Ambulatory Visit | Attending: Gastroenterology | Admitting: Gastroenterology

## 2023-03-12 ENCOUNTER — Encounter (HOSPITAL_COMMUNITY): Admission: RE | Admit: 2023-03-12 | Payer: Medicare Other | Source: Ambulatory Visit

## 2023-03-12 DIAGNOSIS — R1013 Epigastric pain: Secondary | ICD-10-CM | POA: Insufficient documentation

## 2023-03-12 DIAGNOSIS — K635 Polyp of colon: Secondary | ICD-10-CM | POA: Diagnosis not present

## 2023-03-12 DIAGNOSIS — K2970 Gastritis, unspecified, without bleeding: Secondary | ICD-10-CM | POA: Diagnosis not present

## 2023-03-12 DIAGNOSIS — K2950 Unspecified chronic gastritis without bleeding: Secondary | ICD-10-CM | POA: Diagnosis not present

## 2023-03-12 DIAGNOSIS — R1011 Right upper quadrant pain: Secondary | ICD-10-CM | POA: Diagnosis not present

## 2023-03-12 MED ORDER — TECHNETIUM TC 99M MEBROFENIN IV KIT
5.0000 | PACK | Freq: Once | INTRAVENOUS | Status: AC | PRN
  Administered 2023-03-12: 5 via INTRAVENOUS

## 2023-03-15 ENCOUNTER — Encounter (HOSPITAL_COMMUNITY): Payer: Medicare Other

## 2023-03-25 DIAGNOSIS — K8020 Calculus of gallbladder without cholecystitis without obstruction: Secondary | ICD-10-CM | POA: Diagnosis not present

## 2023-05-03 DIAGNOSIS — Z961 Presence of intraocular lens: Secondary | ICD-10-CM | POA: Diagnosis not present

## 2023-05-07 DIAGNOSIS — D649 Anemia, unspecified: Secondary | ICD-10-CM | POA: Diagnosis not present

## 2023-05-07 DIAGNOSIS — E559 Vitamin D deficiency, unspecified: Secondary | ICD-10-CM | POA: Diagnosis not present

## 2023-05-07 DIAGNOSIS — E785 Hyperlipidemia, unspecified: Secondary | ICD-10-CM | POA: Diagnosis not present

## 2023-05-07 DIAGNOSIS — Z0000 Encounter for general adult medical examination without abnormal findings: Secondary | ICD-10-CM | POA: Diagnosis not present

## 2023-05-07 DIAGNOSIS — E1165 Type 2 diabetes mellitus with hyperglycemia: Secondary | ICD-10-CM | POA: Diagnosis not present

## 2023-05-13 DIAGNOSIS — Z0000 Encounter for general adult medical examination without abnormal findings: Secondary | ICD-10-CM | POA: Diagnosis not present

## 2023-05-13 DIAGNOSIS — G9520 Unspecified cord compression: Secondary | ICD-10-CM | POA: Diagnosis not present

## 2023-05-13 DIAGNOSIS — Z9181 History of falling: Secondary | ICD-10-CM | POA: Diagnosis not present

## 2023-05-13 DIAGNOSIS — I10 Essential (primary) hypertension: Secondary | ICD-10-CM | POA: Diagnosis not present

## 2023-05-13 DIAGNOSIS — E785 Hyperlipidemia, unspecified: Secondary | ICD-10-CM | POA: Diagnosis not present

## 2023-05-13 DIAGNOSIS — E119 Type 2 diabetes mellitus without complications: Secondary | ICD-10-CM | POA: Diagnosis not present

## 2023-05-13 DIAGNOSIS — M791 Myalgia, unspecified site: Secondary | ICD-10-CM | POA: Diagnosis not present

## 2023-05-13 DIAGNOSIS — L4050 Arthropathic psoriasis, unspecified: Secondary | ICD-10-CM | POA: Diagnosis not present

## 2023-06-09 DIAGNOSIS — M5030 Other cervical disc degeneration, unspecified cervical region: Secondary | ICD-10-CM | POA: Diagnosis not present

## 2023-06-09 DIAGNOSIS — L4059 Other psoriatic arthropathy: Secondary | ICD-10-CM | POA: Diagnosis not present

## 2023-06-09 DIAGNOSIS — L409 Psoriasis, unspecified: Secondary | ICD-10-CM | POA: Diagnosis not present

## 2023-06-09 DIAGNOSIS — M1991 Primary osteoarthritis, unspecified site: Secondary | ICD-10-CM | POA: Diagnosis not present

## 2023-07-05 DIAGNOSIS — Z1231 Encounter for screening mammogram for malignant neoplasm of breast: Secondary | ICD-10-CM | POA: Diagnosis not present

## 2023-10-27 IMAGING — MR MR CERVICAL SPINE W/O CM
4 of 6 series · 18 of 48 positions shown · non-contrast
Comparison: CT from yesterday

CLINICAL DATA: Follow-up cervical spine fracture

EXAM:
MRI CERVICAL SPINE WITHOUT CONTRAST
TECHNIQUE: Multiplanar, multisequence MR imaging of the cervical spine was
performed. No intravenous contrast was administered.

[Series 2: T2 · sagittal · 3.0mm · 0.35mm/px · 3 of 15 slices shown (1 of 2)]
[im 1/15]
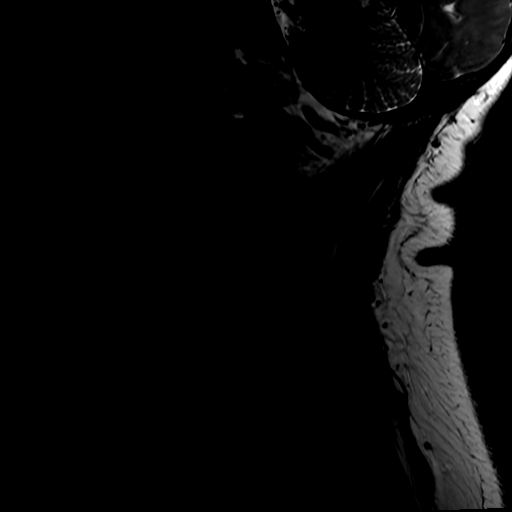
[im 8/15]
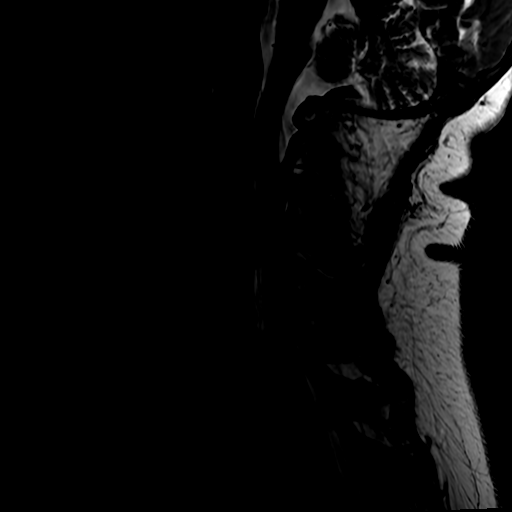
[im 15/15]
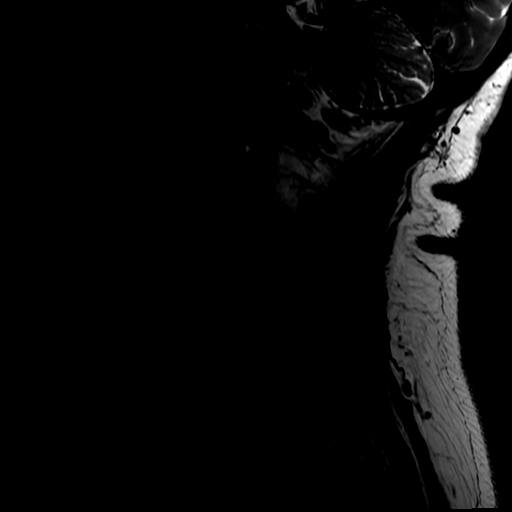

[Series 4: STIR · sagittal · 3.0mm · 0.35mm/px · 3 of 15 slices shown]
[im 1/15]
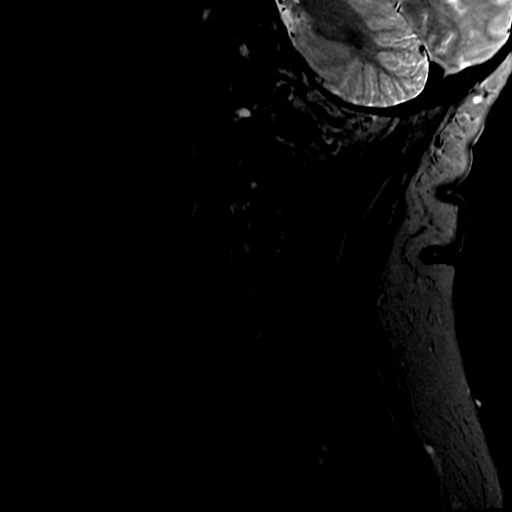
[im 8/15]
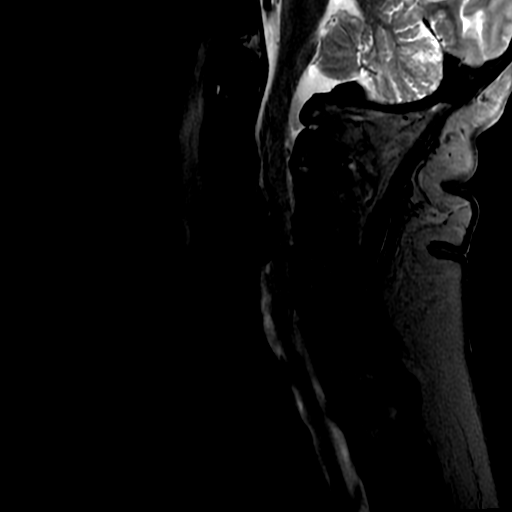
[im 15/15]
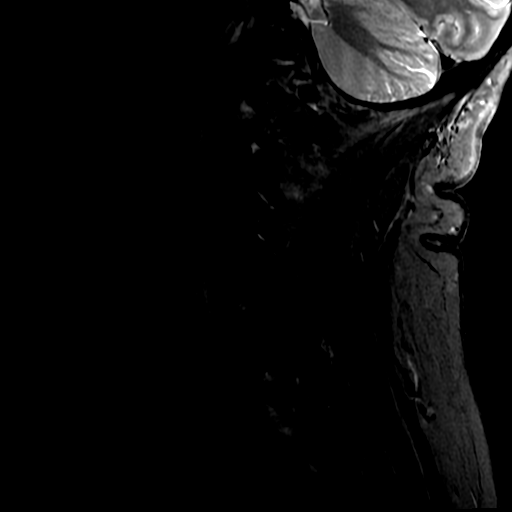

[Series 6: T2 · axial · 3.0mm · 0.35mm/px · z∈[-89,+48]mm · 8 of 43 slices shown (2 of 2)]
[im 1/43]
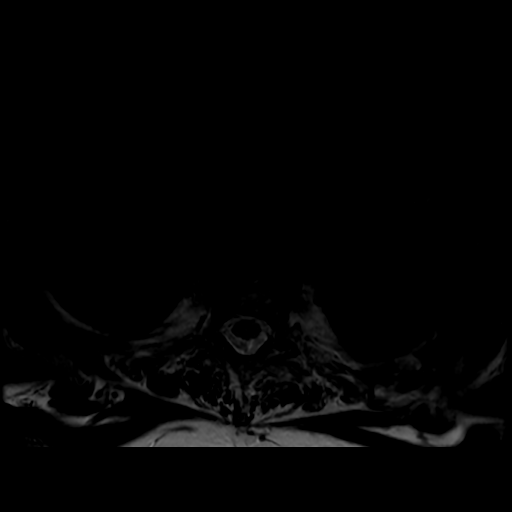
[im 7/43]
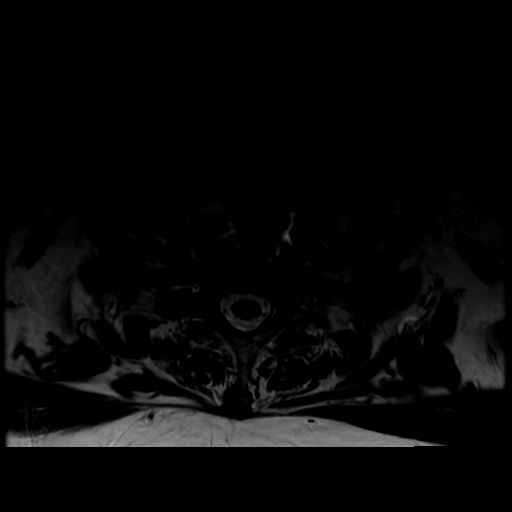
[im 13/43]
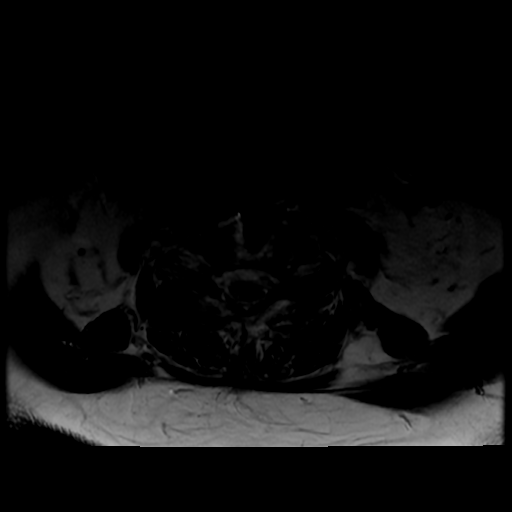
[im 19/43]
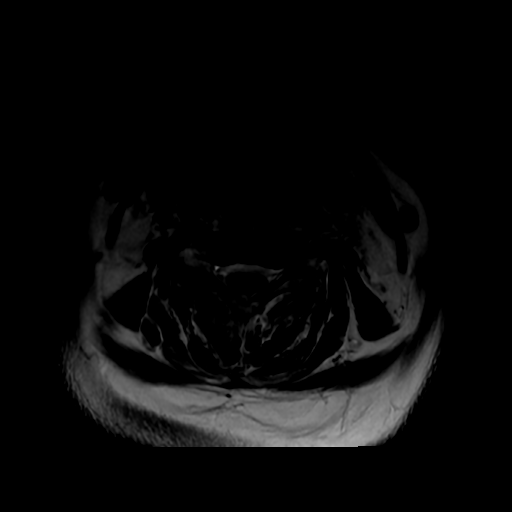
[im 25/43]
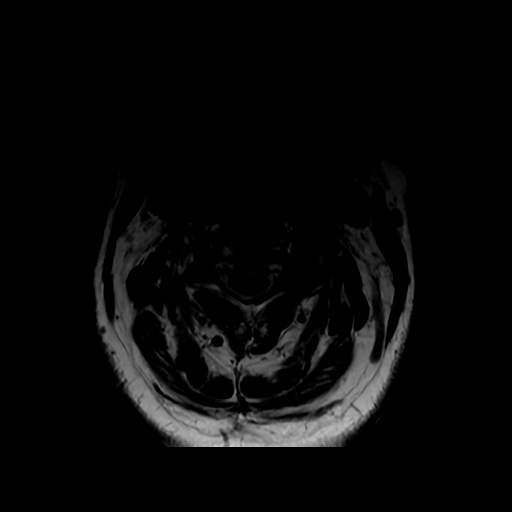
[im 31/43]
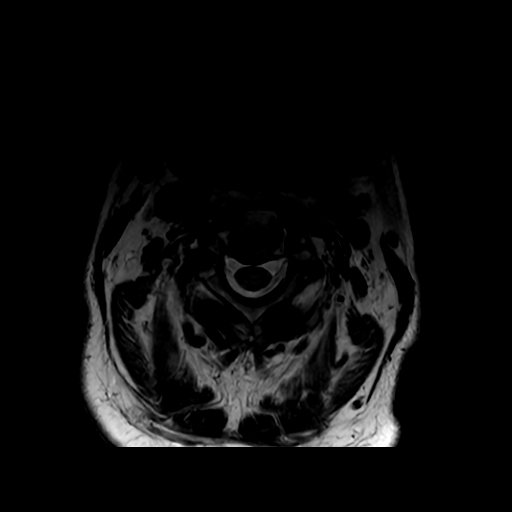
[im 37/43]
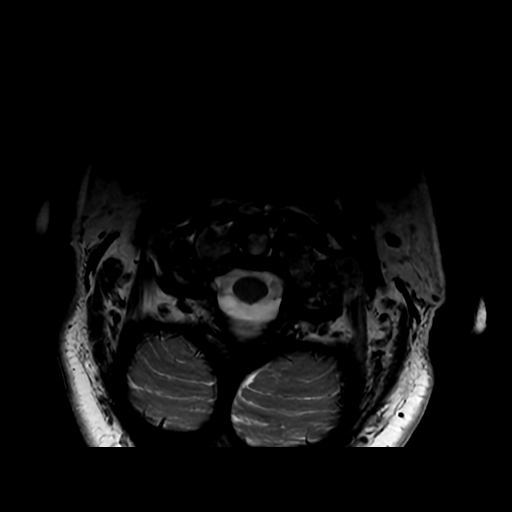
[im 43/43]
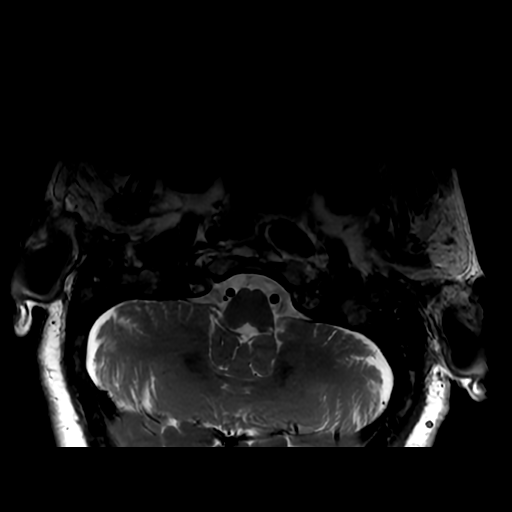

[Series 7: T1 · axial · non-contrast · 3.0mm · 0.35mm/px · z∈[-89,+29]mm · 4 of 42 slices shown]
[im 1/42]
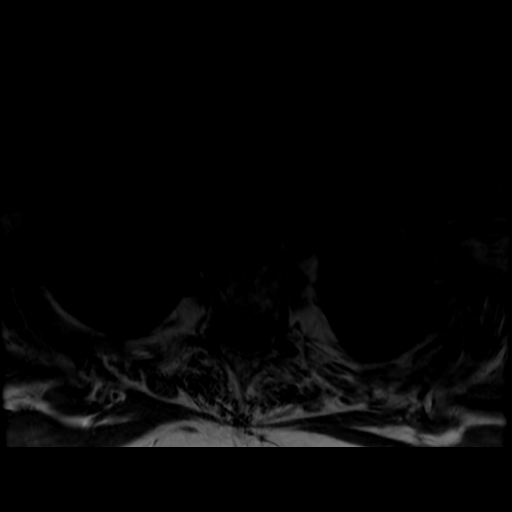
[im 6/42]
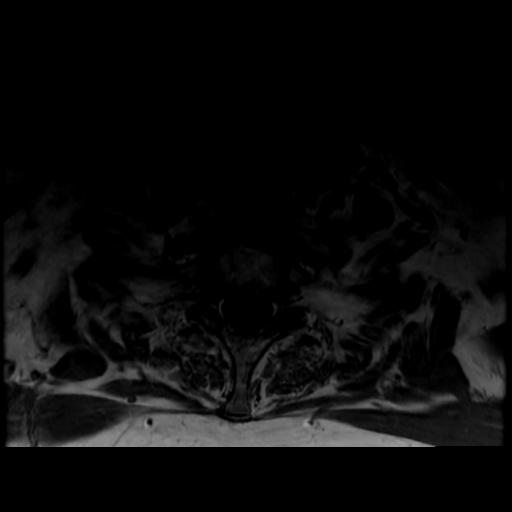
[im 24/42]
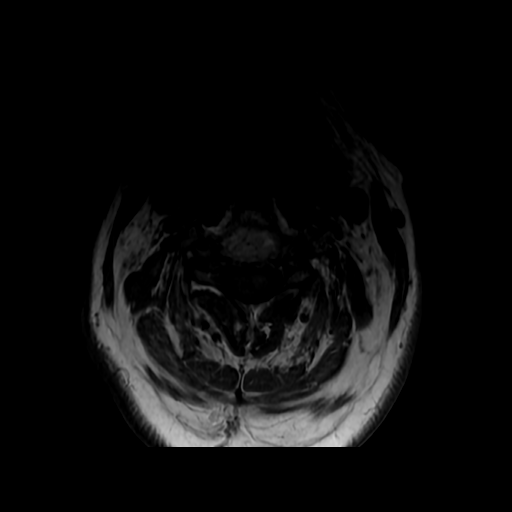
[im 36/42]
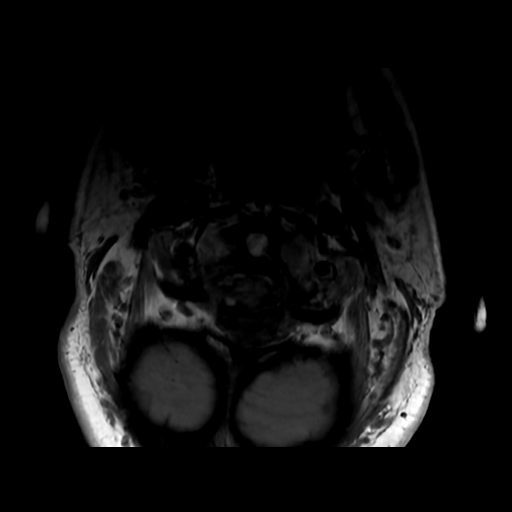

[18 of 48 positions shown; findings below may reference images not displayed]

FINDINGS: Alignment: Straightening of the upper cervical spine, unchanged

Vertebrae: Known oblique fracture through the C4 body involving both
endplates and directed towards the anterior inferior corner of C3
where there is a fracture and visible cleft at the anterior
longitudinal ligament on sagittal T2 weighted imaging. No visible
posterior element involvement or posterior ligamentous disruption.
Prevertebral swelling as expected.

C5-6 and C6-7 ACDF with solid arthrodesis at C5-6 at least. No
hardware failure by prior CT.

Cord: Cord deformity from herniation at C3-4 and especially C4-5. No
cord edema.

Posterior Fossa, vertebral arteries, paraspinal tissues:
Prevertebral edema as noted above.

Disc levels:

C2-3: Degenerative facet spurring asymmetric to the right. Mild
right foraminal narrowing

C3-4: Bilateral paracentral protrusion and buttressing osteophytes
extending to the uncovertebral joints with biforaminal impingement.
Spinal stenosis is mild with mild ventral cord indentation

C4-5: Disc narrowing and bulging with broad central protrusion
flattening the cord. Continuation of foraminal disc greater on the
left with bilateral uncovertebral spurring and foraminal
impingement.

C5-6: ACDF.  Patent canal and foramina

C6-7: ACDF.  Patent canal and foramina

C7-T1:Degenerative facet spurring and left eccentric ridging with
foraminal protrusion. Advanced left foraminal impingement.

Intermittent motion artifact.
IMPRESSION: 1. Known C4 body and C3 anterior inferior corner fractures. The
anterior longitudinal ligament is disrupted at the level of the C3
corner fracture. No visible injury to the posterior elements.
2. C4-5 cord flattening primarily from central disc protrusion.
Milder degenerative spinal stenosis at C3-4.
3. Biforaminal impingement at C3-4 and C4-5. Left foraminal
impingement at C7-T1.
4. C5-C7 ACDF.

## 2023-10-27 IMAGING — CR DG CERVICAL SPINE 2 OR 3 VIEWS
4 series · 4 of 4 positions shown · non-contrast
Comparison: MRI cervical spine 02/01/2022, CT cervical spine
01/31/2022

CLINICAL DATA: Cervical myelopathy.

EXAM:
CERVICAL SPINE - 2-3 VIEW

[c-spine lat]
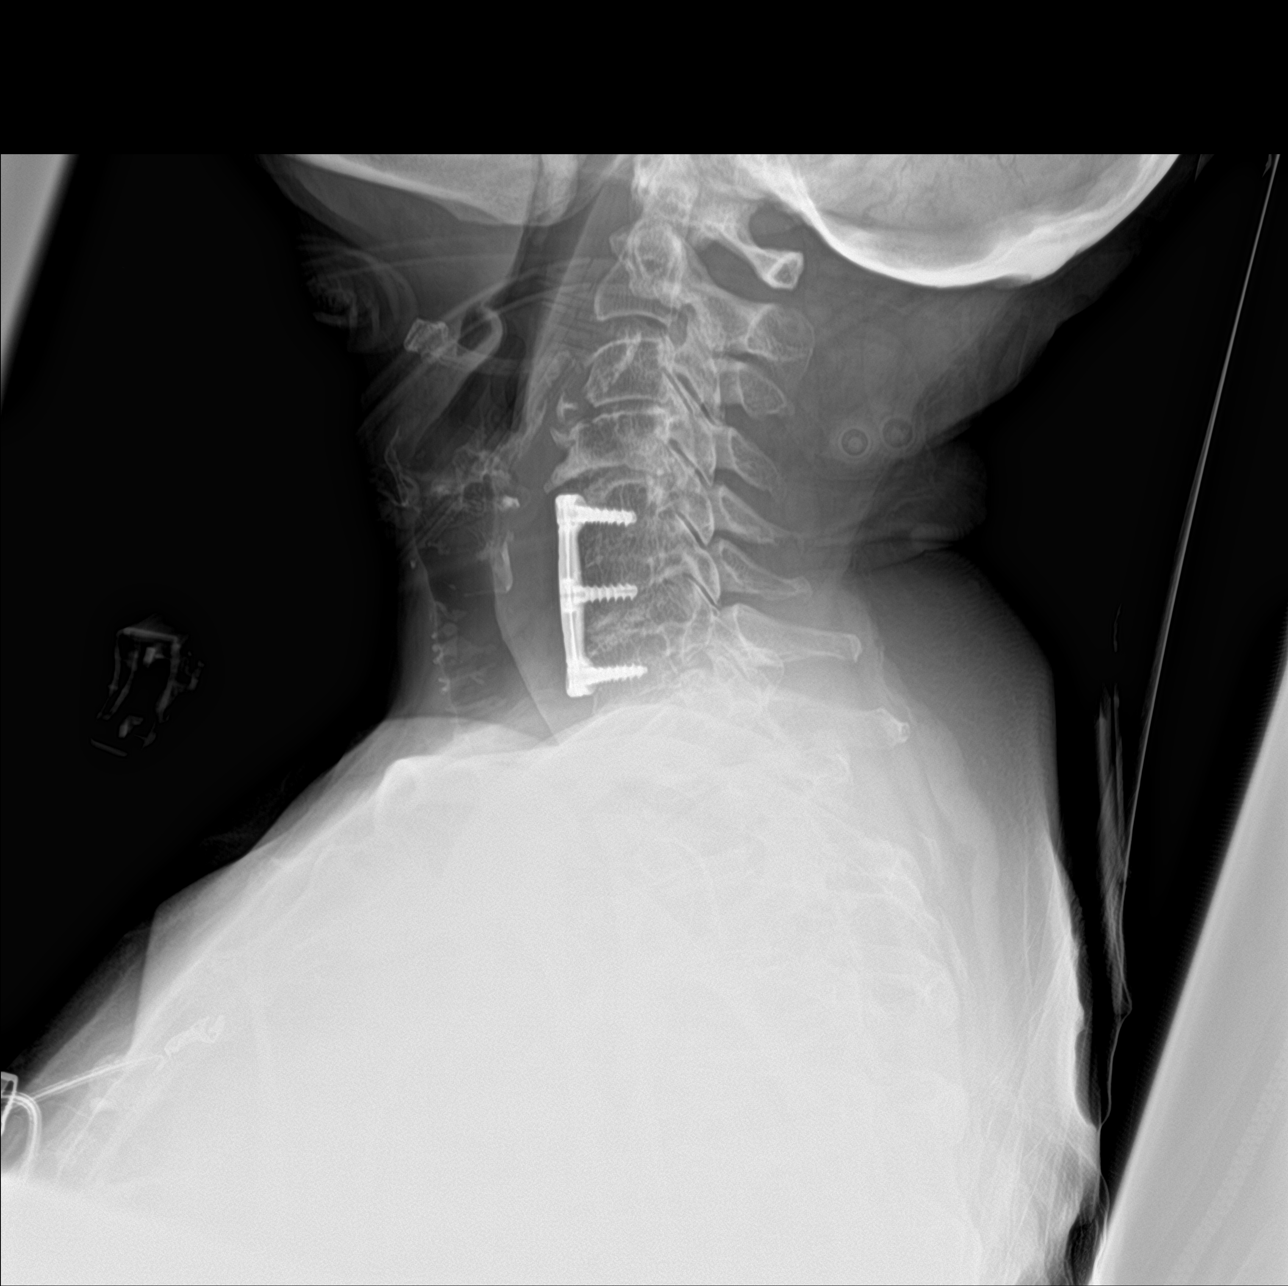

[c-spine ap]
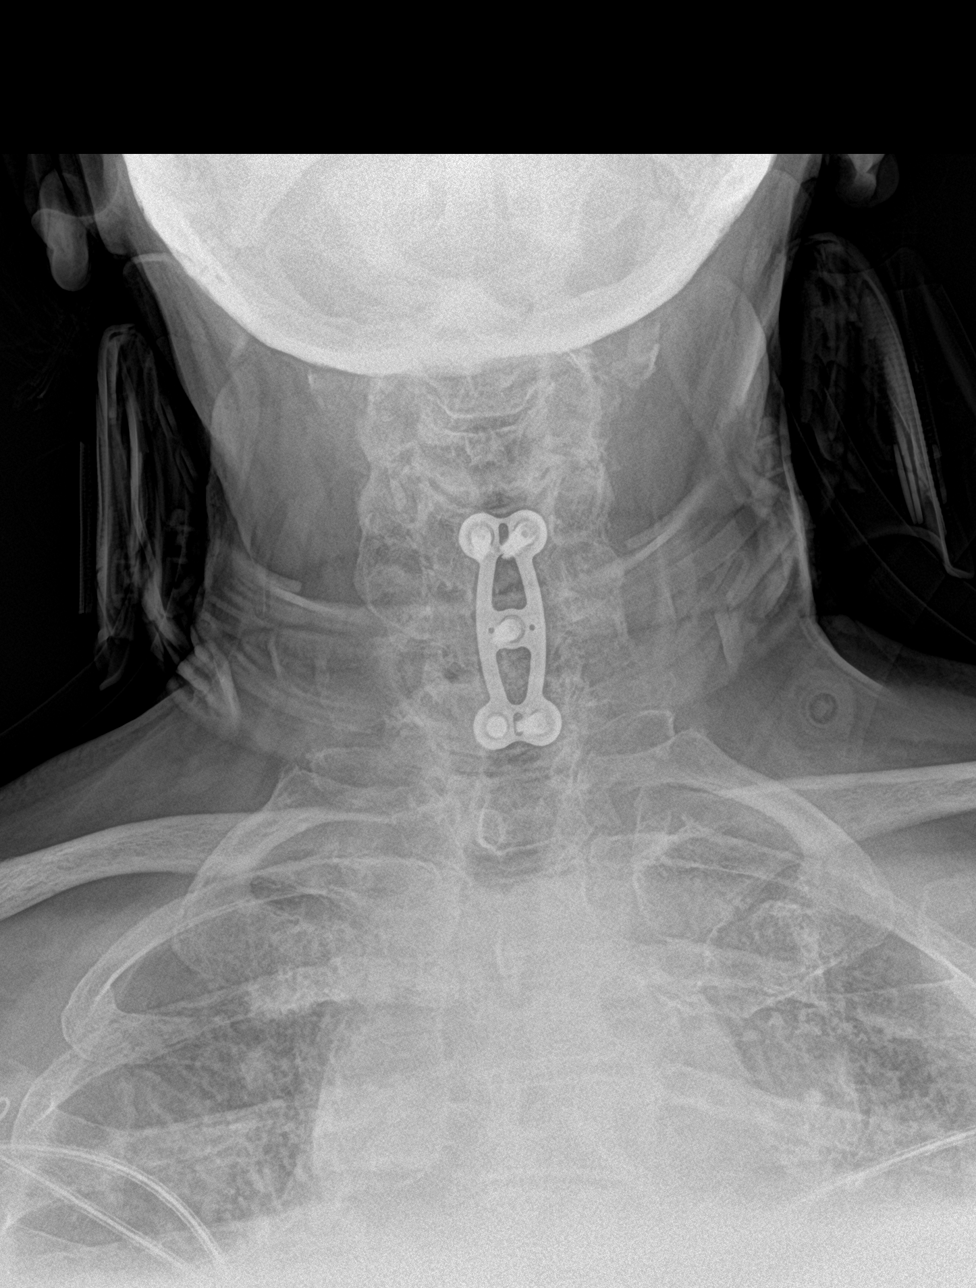

[c-spine swimmers]
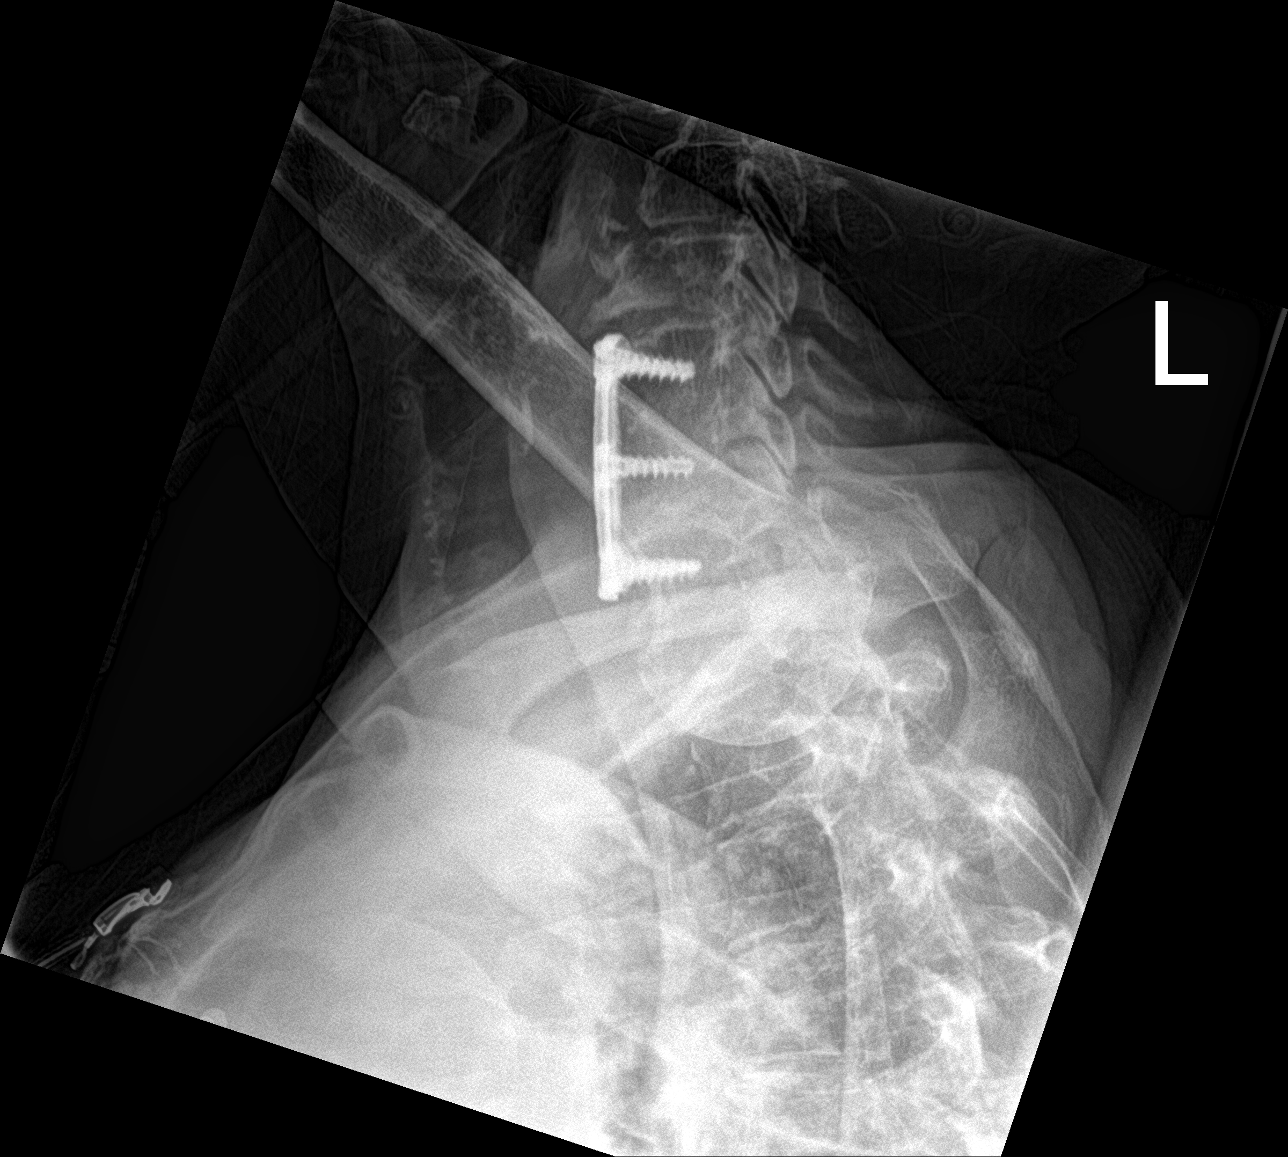

[c-spine open mouth]
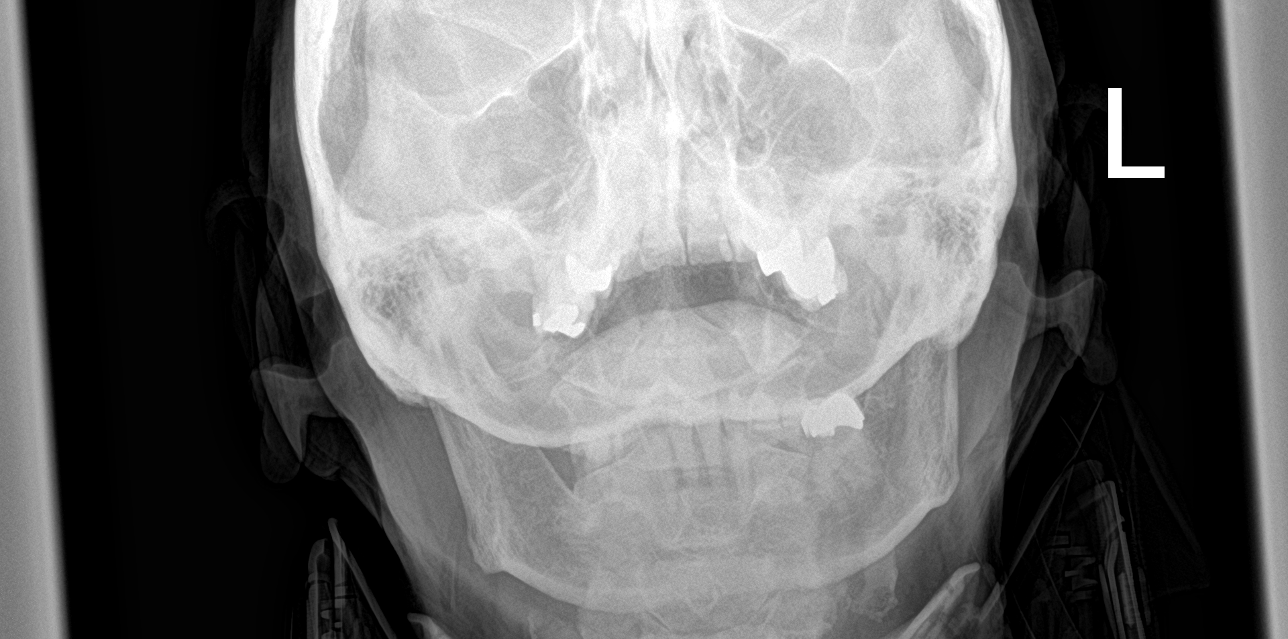

[4 of 4 positions shown; findings below may reference images not displayed]

FINDINGS: The patient is imaged in a cervical collar.

Known oblique fracture within the mid to anterior C4 vertebral body
from anterior superior to posteroinferior orientation. Additional
oblique linear fracture through the anterior inferior corner of the
C3 vertebral body with mild displacement of the anterior inferior
corner of the C3 vertebral body, similar to prior CT. No significant
retropulsion.

Status post C5 through C7 ACDF with osseous fusion at C5-6 and disc
space narrowing but non fusion at C6-7 unchanged.

The atlantodens interval is intact. Mild prevertebral soft tissue
swelling.
IMPRESSION: Known mid and anterior C4 and anterior inferior C3 acute vertebral
body fractures.

## 2023-10-28 IMAGING — RF DG CERVICAL SPINE 2 OR 3 VIEWS
1 series · 2 of 2 positions shown · non-contrast
Comparison: 02/01/2022

CLINICAL DATA: C4 corpectomy and plate removal.

EXAM:
CERVICAL SPINE - 2-3 VIEW

[Series 1: run · 2 of 2 slices shown]
[im 1/2]
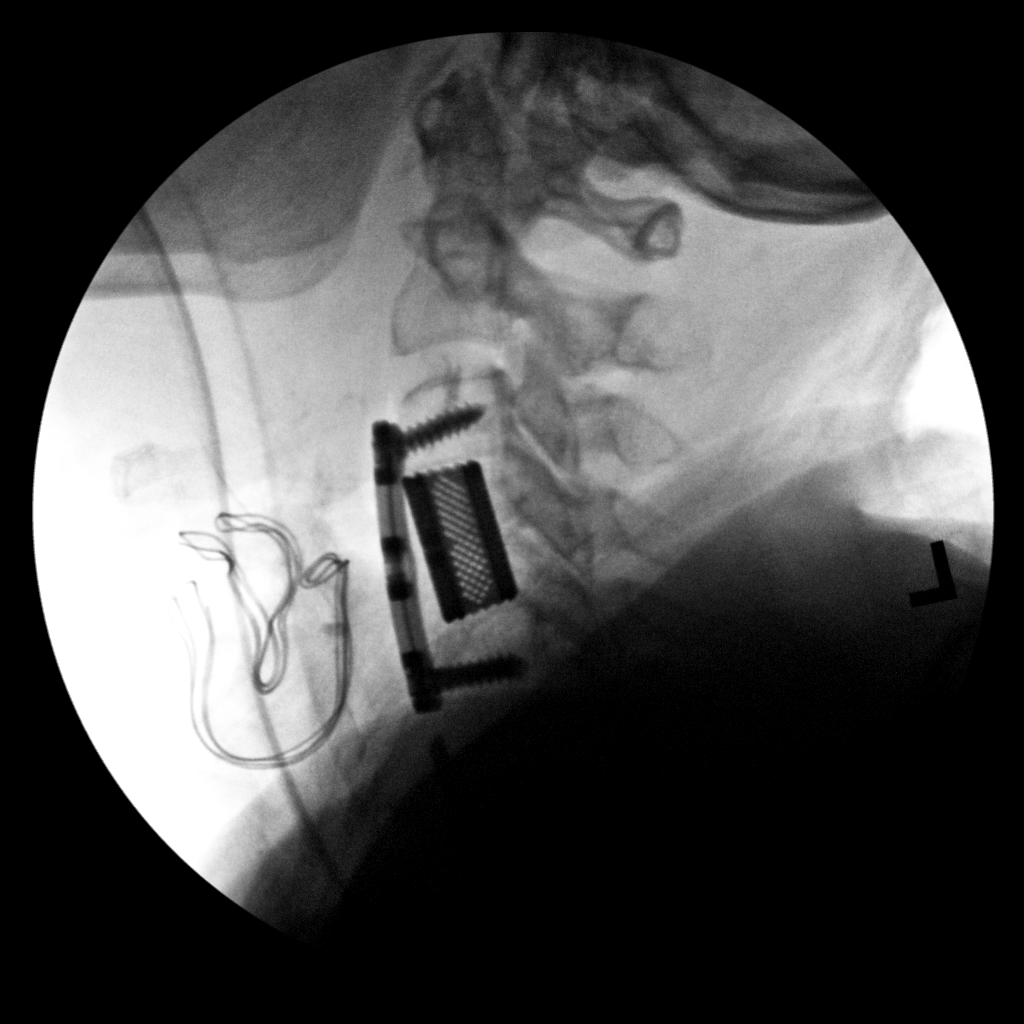
[im 2/2]
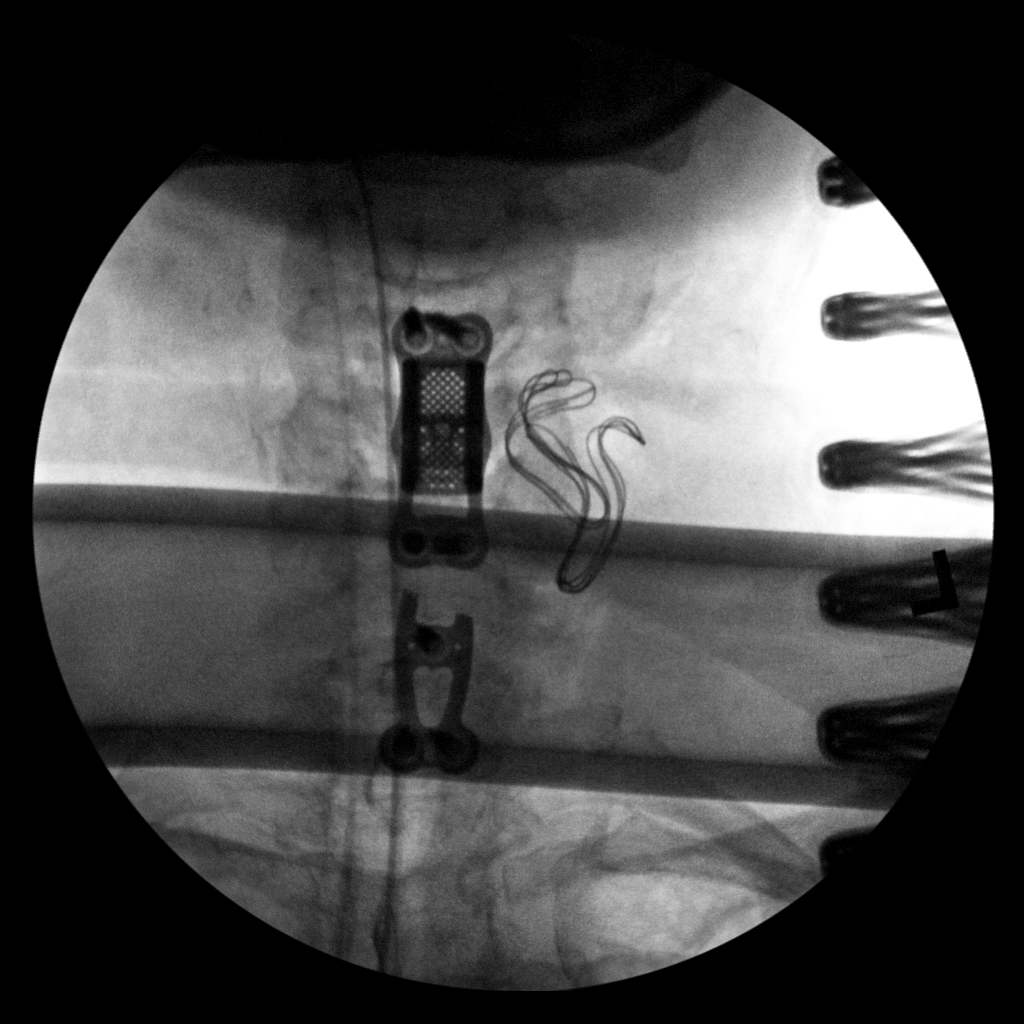

[2 of 2 positions shown; findings below may reference images not displayed]

FINDINGS: PA and lateral C-arm images of the cervical spine demonstrate
interval C4 corpectomy and strut placement. New anterior plate and
screws extending from the C3 to the C5 level. Normal alignment. The
upper part of the previously seen C5-C7 plate and screws has been
removed with the inferior portion remaining at the C6-7 level. This
is not visible on the lateral view due to overlapping of the
patient's shoulders.
IMPRESSION: Operative changes, as described above.

## 2023-11-10 DIAGNOSIS — I10 Essential (primary) hypertension: Secondary | ICD-10-CM | POA: Diagnosis not present

## 2023-11-10 DIAGNOSIS — Z5181 Encounter for therapeutic drug level monitoring: Secondary | ICD-10-CM | POA: Diagnosis not present

## 2023-11-10 DIAGNOSIS — R350 Frequency of micturition: Secondary | ICD-10-CM | POA: Diagnosis not present

## 2023-11-10 DIAGNOSIS — E559 Vitamin D deficiency, unspecified: Secondary | ICD-10-CM | POA: Diagnosis not present

## 2023-11-10 DIAGNOSIS — E1169 Type 2 diabetes mellitus with other specified complication: Secondary | ICD-10-CM | POA: Diagnosis not present

## 2023-11-10 DIAGNOSIS — E785 Hyperlipidemia, unspecified: Secondary | ICD-10-CM | POA: Diagnosis not present

## 2023-11-10 DIAGNOSIS — D649 Anemia, unspecified: Secondary | ICD-10-CM | POA: Diagnosis not present

## 2023-11-10 DIAGNOSIS — L4050 Arthropathic psoriasis, unspecified: Secondary | ICD-10-CM | POA: Diagnosis not present

## 2023-12-06 DIAGNOSIS — N289 Disorder of kidney and ureter, unspecified: Secondary | ICD-10-CM | POA: Diagnosis not present

## 2023-12-29 DIAGNOSIS — L4059 Other psoriatic arthropathy: Secondary | ICD-10-CM | POA: Diagnosis not present

## 2023-12-29 DIAGNOSIS — M1991 Primary osteoarthritis, unspecified site: Secondary | ICD-10-CM | POA: Diagnosis not present

## 2023-12-29 DIAGNOSIS — M5030 Other cervical disc degeneration, unspecified cervical region: Secondary | ICD-10-CM | POA: Diagnosis not present

## 2024-03-09 DIAGNOSIS — M25552 Pain in left hip: Secondary | ICD-10-CM | POA: Diagnosis not present

## 2024-03-16 DIAGNOSIS — Z85828 Personal history of other malignant neoplasm of skin: Secondary | ICD-10-CM | POA: Diagnosis not present

## 2024-03-16 DIAGNOSIS — D1801 Hemangioma of skin and subcutaneous tissue: Secondary | ICD-10-CM | POA: Diagnosis not present

## 2024-03-16 DIAGNOSIS — L821 Other seborrheic keratosis: Secondary | ICD-10-CM | POA: Diagnosis not present

## 2024-03-16 DIAGNOSIS — L814 Other melanin hyperpigmentation: Secondary | ICD-10-CM | POA: Diagnosis not present

## 2024-03-16 DIAGNOSIS — L400 Psoriasis vulgaris: Secondary | ICD-10-CM | POA: Diagnosis not present

## 2024-03-16 DIAGNOSIS — D225 Melanocytic nevi of trunk: Secondary | ICD-10-CM | POA: Diagnosis not present

## 2024-03-16 DIAGNOSIS — D224 Melanocytic nevi of scalp and neck: Secondary | ICD-10-CM | POA: Diagnosis not present

## 2024-05-03 DIAGNOSIS — Z961 Presence of intraocular lens: Secondary | ICD-10-CM | POA: Diagnosis not present

## 2024-06-01 DIAGNOSIS — L4050 Arthropathic psoriasis, unspecified: Secondary | ICD-10-CM | POA: Diagnosis not present

## 2024-06-01 DIAGNOSIS — Z0000 Encounter for general adult medical examination without abnormal findings: Secondary | ICD-10-CM | POA: Diagnosis not present

## 2024-06-01 DIAGNOSIS — E785 Hyperlipidemia, unspecified: Secondary | ICD-10-CM | POA: Diagnosis not present

## 2024-06-01 DIAGNOSIS — G9520 Unspecified cord compression: Secondary | ICD-10-CM | POA: Diagnosis not present

## 2024-06-01 DIAGNOSIS — M791 Myalgia, unspecified site: Secondary | ICD-10-CM | POA: Diagnosis not present

## 2024-06-01 DIAGNOSIS — E1169 Type 2 diabetes mellitus with other specified complication: Secondary | ICD-10-CM | POA: Diagnosis not present

## 2024-06-01 DIAGNOSIS — E559 Vitamin D deficiency, unspecified: Secondary | ICD-10-CM | POA: Diagnosis not present

## 2024-06-01 DIAGNOSIS — I10 Essential (primary) hypertension: Secondary | ICD-10-CM | POA: Diagnosis not present

## 2024-06-28 DIAGNOSIS — L409 Psoriasis, unspecified: Secondary | ICD-10-CM | POA: Diagnosis not present

## 2024-06-28 DIAGNOSIS — L4059 Other psoriatic arthropathy: Secondary | ICD-10-CM | POA: Diagnosis not present

## 2024-06-28 DIAGNOSIS — M1991 Primary osteoarthritis, unspecified site: Secondary | ICD-10-CM | POA: Diagnosis not present

## 2024-06-28 DIAGNOSIS — M5030 Other cervical disc degeneration, unspecified cervical region: Secondary | ICD-10-CM | POA: Diagnosis not present
